# Patient Record
Sex: Female | Born: 1985 | Race: Black or African American | Hispanic: No | Smoking: Never smoker
Health system: Southern US, Community
[De-identification: ages and names within clinical notes are randomized; demographics above are authoritative.]

## PROBLEM LIST (undated history)

## (undated) DIAGNOSIS — I10 Essential (primary) hypertension: Secondary | ICD-10-CM

## (undated) DIAGNOSIS — F419 Anxiety disorder, unspecified: Secondary | ICD-10-CM

## (undated) DIAGNOSIS — I2699 Other pulmonary embolism without acute cor pulmonale: Secondary | ICD-10-CM

## (undated) DIAGNOSIS — C801 Malignant (primary) neoplasm, unspecified: Secondary | ICD-10-CM

## (undated) DIAGNOSIS — D689 Coagulation defect, unspecified: Secondary | ICD-10-CM

## (undated) DIAGNOSIS — Z5189 Encounter for other specified aftercare: Secondary | ICD-10-CM

## (undated) HISTORY — PX: PORT-A-CATH REMOVAL: SHX5289

## (undated) HISTORY — DX: Encounter for other specified aftercare: Z51.89

## (undated) HISTORY — DX: Malignant (primary) neoplasm, unspecified: C80.1

## (undated) HISTORY — PX: KNEE SURGERY: SHX244

## (undated) HISTORY — DX: Coagulation defect, unspecified: D68.9

## (undated) HISTORY — DX: Anxiety disorder, unspecified: F41.9

---

## 2008-12-13 ENCOUNTER — Other Ambulatory Visit: Admission: RE | Admit: 2008-12-13 | Discharge: 2008-12-13 | Payer: Self-pay | Admitting: Obstetrics and Gynecology

## 2009-12-12 ENCOUNTER — Other Ambulatory Visit
Admission: RE | Admit: 2009-12-12 | Discharge: 2009-12-12 | Payer: Self-pay | Source: Home / Self Care | Admitting: Obstetrics and Gynecology

## 2010-06-28 ENCOUNTER — Encounter: Payer: Self-pay | Admitting: Obstetrics & Gynecology

## 2010-06-28 ENCOUNTER — Inpatient Hospital Stay (HOSPITAL_COMMUNITY)
Admission: AD | Admit: 2010-06-28 | Discharge: 2010-06-30 | Payer: Self-pay | Source: Home / Self Care | Attending: Obstetrics & Gynecology | Admitting: Obstetrics & Gynecology

## 2010-09-11 LAB — URINALYSIS, ROUTINE W REFLEX MICROSCOPIC
Leukocytes, UA: NEGATIVE
Nitrite: NEGATIVE
Protein, ur: 100 mg/dL — AB
Specific Gravity, Urine: 1.025 (ref 1.005–1.030)
Urobilinogen, UA: 1 mg/dL (ref 0.0–1.0)

## 2010-09-11 LAB — MAGNESIUM: Magnesium: 3.8 mg/dL — ABNORMAL HIGH (ref 1.5–2.5)

## 2010-09-11 LAB — COMPREHENSIVE METABOLIC PANEL
AST: 31 U/L (ref 0–37)
BUN: 6 mg/dL (ref 6–23)
CO2: 21 mEq/L (ref 19–32)
Calcium: 9 mg/dL (ref 8.4–10.5)
Creatinine, Ser: 0.6 mg/dL (ref 0.4–1.2)
GFR calc Af Amer: >60 mL/min (ref >60)
GFR calc non Af Amer: >60 mL/min (ref >60)
Total Bilirubin: 0.4 mg/dL (ref 0.3–1.2)

## 2010-09-11 LAB — CBC
Hemoglobin: 12 g/dL (ref 12.0–15.0)
MCH: 32.5 pg (ref 26.0–34.0)
MCHC: 35.5 g/dL (ref 30.0–36.0)
MCV: 91.6 fL (ref 78.0–100.0)
Platelets: 221 10*3/uL (ref 150–400)

## 2010-09-11 LAB — URINE MICROSCOPIC-ADD ON

## 2010-09-11 LAB — PROTEIN / CREATININE RATIO, URINE: Creatinine, Urine: 208.9 mg/dL

## 2010-09-11 LAB — MRSA PCR SCREENING: MRSA by PCR: NEGATIVE

## 2010-12-21 ENCOUNTER — Emergency Department (HOSPITAL_COMMUNITY)
Admission: EM | Admit: 2010-12-21 | Discharge: 2010-12-21 | Disposition: A | Discharge status: Home / Self Care | Payer: Medicaid Other | Attending: Emergency Medicine | Admitting: Emergency Medicine

## 2010-12-21 DIAGNOSIS — R112 Nausea with vomiting, unspecified: Secondary | ICD-10-CM | POA: Insufficient documentation

## 2010-12-21 DIAGNOSIS — R197 Diarrhea, unspecified: Secondary | ICD-10-CM | POA: Insufficient documentation

## 2010-12-21 LAB — BASIC METABOLIC PANEL
CO2: 20 mEq/L (ref 19–32)
Chloride: 106 mEq/L (ref 96–112)
GFR calc Af Amer: >60 mL/min (ref >60)
Potassium: 3.8 mEq/L (ref 3.5–5.1)
Sodium: 138 mEq/L (ref 135–145)

## 2010-12-21 LAB — URINALYSIS, ROUTINE W REFLEX MICROSCOPIC
Bilirubin Urine: NEGATIVE
Leukocytes, UA: NEGATIVE
Nitrite: NEGATIVE
Specific Gravity, Urine: 1.025 (ref 1.005–1.030)
Urobilinogen, UA: 0.2 mg/dL (ref 0.0–1.0)
pH: 5.5 (ref 5.0–8.0)

## 2010-12-21 LAB — PREGNANCY, URINE: Preg Test, Ur: NEGATIVE

## 2010-12-21 LAB — URINE MICROSCOPIC-ADD ON

## 2012-05-02 ENCOUNTER — Other Ambulatory Visit: Payer: Self-pay | Admitting: Obstetrics & Gynecology

## 2012-05-02 ENCOUNTER — Other Ambulatory Visit (HOSPITAL_COMMUNITY)
Admission: RE | Admit: 2012-05-02 | Discharge: 2012-05-02 | Disposition: A | Discharge status: Home / Self Care | Payer: Medicaid Other | Source: Ambulatory Visit | Attending: Obstetrics & Gynecology | Admitting: Obstetrics & Gynecology

## 2012-05-02 DIAGNOSIS — Z01419 Encounter for gynecological examination (general) (routine) without abnormal findings: Secondary | ICD-10-CM | POA: Insufficient documentation

## 2013-01-05 ENCOUNTER — Encounter (HOSPITAL_COMMUNITY): Payer: Self-pay | Admitting: Emergency Medicine

## 2013-01-05 ENCOUNTER — Emergency Department (HOSPITAL_COMMUNITY): Payer: 59

## 2013-01-05 ENCOUNTER — Observation Stay (HOSPITAL_COMMUNITY)
Admission: EM | Admit: 2013-01-05 | Discharge: 2013-01-08 | Disposition: A | Discharge status: Home / Self Care | Payer: 59 | Attending: Family Medicine | Admitting: Family Medicine

## 2013-01-05 DIAGNOSIS — I2699 Other pulmonary embolism without acute cor pulmonale: Principal | ICD-10-CM | POA: Insufficient documentation

## 2013-01-05 DIAGNOSIS — R0989 Other specified symptoms and signs involving the circulatory and respiratory systems: Secondary | ICD-10-CM

## 2013-01-05 DIAGNOSIS — R079 Chest pain, unspecified: Secondary | ICD-10-CM | POA: Insufficient documentation

## 2013-01-05 DIAGNOSIS — Z86711 Personal history of pulmonary embolism: Secondary | ICD-10-CM | POA: Diagnosis present

## 2013-01-05 DIAGNOSIS — R0602 Shortness of breath: Secondary | ICD-10-CM | POA: Insufficient documentation

## 2013-01-05 DIAGNOSIS — R0600 Dyspnea, unspecified: Secondary | ICD-10-CM | POA: Diagnosis present

## 2013-01-05 DIAGNOSIS — R0789 Other chest pain: Secondary | ICD-10-CM | POA: Diagnosis present

## 2013-01-05 LAB — COMPREHENSIVE METABOLIC PANEL
Albumin: 3.8 g/dL (ref 3.5–5.2)
BUN: 8 mg/dL (ref 6–23)
Creatinine, Ser: 0.74 mg/dL (ref 0.50–1.10)
Total Bilirubin: 0.7 mg/dL (ref 0.3–1.2)
Total Protein: 7.4 g/dL (ref 6.0–8.3)

## 2013-01-05 LAB — URINALYSIS, ROUTINE W REFLEX MICROSCOPIC
Ketones, ur: NEGATIVE mg/dL
Leukocytes, UA: NEGATIVE
Nitrite: NEGATIVE
Protein, ur: NEGATIVE mg/dL

## 2013-01-05 LAB — CBC WITH DIFFERENTIAL/PLATELET
Basophils Absolute: 0 10*3/uL (ref 0.0–0.1)
Eosinophils Absolute: 0.1 10*3/uL (ref 0.0–0.7)
Eosinophils Relative: 1 % (ref 0–5)
MCH: 31.9 pg (ref 26.0–34.0)
MCV: 90.6 fL (ref 78.0–100.0)
Platelets: 244 10*3/uL (ref 150–400)
RDW: 12.3 % (ref 11.5–15.5)
WBC: 8.8 10*3/uL (ref 4.0–10.5)

## 2013-01-05 LAB — LIPASE, BLOOD: Lipase: 29 U/L (ref 11–59)

## 2013-01-05 MED ORDER — ENOXAPARIN SODIUM 100 MG/ML ~~LOC~~ SOLN
100.0000 mg | Freq: Once | SUBCUTANEOUS | Status: AC
  Administered 2013-01-05: 100 mg via SUBCUTANEOUS
  Filled 2013-01-05: qty 1

## 2013-01-05 MED ORDER — MORPHINE SULFATE 4 MG/ML IJ SOLN
4.0000 mg | Freq: Once | INTRAMUSCULAR | Status: AC
  Administered 2013-01-05: 4 mg via INTRAVENOUS
  Filled 2013-01-05: qty 1

## 2013-01-05 MED ORDER — ACETAMINOPHEN 650 MG RE SUPP: 650.0000 mg | Freq: Four times a day (QID) | RECTAL | Status: DC | PRN

## 2013-01-05 MED ORDER — RIVAROXABAN 10 MG PO TABS
15.0000 mg | ORAL_TABLET | Freq: Two times a day (BID) | ORAL | Status: DC
  Administered 2013-01-05 – 2013-01-08 (×6): 15 mg via ORAL
  Filled 2013-01-05 (×6): qty 2

## 2013-01-05 MED ORDER — IOHEXOL 350 MG/ML SOLN
100.0000 mL | Freq: Once | INTRAVENOUS | Status: AC | PRN
  Administered 2013-01-05: 100 mL via INTRAVENOUS

## 2013-01-05 MED ORDER — MORPHINE SULFATE 2 MG/ML IJ SOLN: 2.0000 mg | Freq: Once | INTRAMUSCULAR | Status: AC

## 2013-01-05 MED ORDER — ONDANSETRON HCL 4 MG PO TABS: 4.0000 mg | ORAL_TABLET | Freq: Four times a day (QID) | ORAL | Status: DC | PRN

## 2013-01-05 MED ORDER — ONDANSETRON HCL 4 MG/2ML IJ SOLN
4.0000 mg | Freq: Once | INTRAMUSCULAR | Status: AC
  Administered 2013-01-05: 4 mg via INTRAVENOUS
  Filled 2013-01-05: qty 2

## 2013-01-05 MED ORDER — ALUM & MAG HYDROXIDE-SIMETH 200-200-20 MG/5ML PO SUSP: 30.0000 mL | Freq: Four times a day (QID) | ORAL | Status: DC | PRN

## 2013-01-05 MED ORDER — HYDROCODONE-ACETAMINOPHEN 7.5-325 MG PO TABS
1.0000 | ORAL_TABLET | Freq: Four times a day (QID) | ORAL | Status: DC | PRN
  Administered 2013-01-05 – 2013-01-06 (×3): 1 via ORAL
  Filled 2013-01-05 (×3): qty 1

## 2013-01-05 MED ORDER — MORPHINE SULFATE 2 MG/ML IJ SOLN
2.0000 mg | INTRAMUSCULAR | Status: DC | PRN
  Administered 2013-01-05 – 2013-01-06 (×4): 2 mg via INTRAVENOUS
  Filled 2013-01-05 (×4): qty 1

## 2013-01-05 MED ORDER — ONDANSETRON HCL 4 MG/2ML IJ SOLN
4.0000 mg | Freq: Four times a day (QID) | INTRAMUSCULAR | Status: DC | PRN
  Administered 2013-01-05 – 2013-01-07 (×3): 4 mg via INTRAVENOUS
  Filled 2013-01-05 (×3): qty 2

## 2013-01-05 MED ORDER — MORPHINE SULFATE 2 MG/ML IJ SOLN: 2.0000 mg | INTRAMUSCULAR | Status: DC | PRN

## 2013-01-05 MED ORDER — SODIUM CHLORIDE 0.9 % IJ SOLN: 3.0000 mL | INTRAMUSCULAR | Status: DC | PRN

## 2013-01-05 MED ORDER — ACETAMINOPHEN 325 MG PO TABS
650.0000 mg | ORAL_TABLET | Freq: Four times a day (QID) | ORAL | Status: DC | PRN
  Administered 2013-01-06: 650 mg via ORAL
  Filled 2013-01-05: qty 2

## 2013-01-05 MED ORDER — MORPHINE SULFATE 2 MG/ML IJ SOLN
INTRAMUSCULAR | Status: AC
  Administered 2013-01-05: 2 mg via INTRAVENOUS
  Filled 2013-01-05: qty 1

## 2013-01-05 MED ORDER — SODIUM CHLORIDE 0.9 % IV SOLN: 250.0000 mL | INTRAVENOUS | Status: DC | PRN

## 2013-01-05 MED ORDER — SODIUM CHLORIDE 0.9 % IJ SOLN
3.0000 mL | Freq: Two times a day (BID) | INTRAMUSCULAR | Status: DC
  Administered 2013-01-05 – 2013-01-08 (×7): 3 mL via INTRAVENOUS

## 2013-01-05 NOTE — H&P (Signed)
[facility] History and Physical  Denise Khan XBJ:[id number] DOB: 1986/06/05 DOA: 01/05/2013  Referring physician:  PCP: No PCP Per Patient  Specialists:   Chief Complaint: chest pain/dyspnea  HPI: Denise Khan is a very pleasant 27 y.o. female with no significant past medical history who presents to the emergency room with a chief complaint of chest pain and shortness of breath. Information is obtained from the patient. She indicates that she was in her usual state of health until yesterday while watching TV she developed a sudden sharp pain just under her left breast. She indicates at that time the pain was constant sharp at a rate of 3/10. She states that as the evening and night progressed the pain intensified and spread to her low back on the left side up to her left scapula. By early this morning the pain had spread to her left shoulder and down her left arm. She describes it as a throbbing sensation in her arm. She denies any numbness or tingling or weakness in this arm. She rated the pain at this point a 8/10. She developed shortness of breath as well. She denies any cough. She indicates that she tried to lie down but that made the pain and the dyspnea worse. She also indicates that sitting up wasn't comfortable so she paced in her home for a good portion of the night last night. She apparently sat in a recliner and dozed for a while. She states that when she awakened this morning she noted a slight improvement so she took a shower and prepared to go to work. On the drive to [facility] where she works she indicates that the pain and shortness of breath began to worsen again. She turned around and came home. At that point she tried to lie down again and got no relief. She decided to come to the emergency room at this point. She denies any chest pain headache visual disturbances. She does note some intermittent nausea without vomiting and some dizziness. She denies any recent travel.  She has not had any surgery lately. Evaluation in the emergency room notes the CT angiogram chest positive for pulmonary embolism. She was given a dose of Lovenox. Symptoms came on suddenly have persisted and worsened. Characterized as moderate. [facility] were asked to admit.   Review of Systems: The patient denies anorexia, fever, weight loss,, vision loss, decreased hearing, hoarseness, chest pain, syncope,  peripheral edema, balance deficits, hemoptysis, abdominal pain, melena, hematochezia, severe indigestion/heartburn, hematuria, incontinence, genital sores, muscle weakness, suspicious skin lesions, transient blindness, difficulty walking, depression, unusual weight change, abnormal bleeding, enlarged lymph nodes, angioedema, and breast masses.    History reviewed. No pertinent past medical history. History reviewed. No pertinent past surgical history. Social History:  has no tobacco, alcohol, and drug history on file. Patient lives with her significant other and her small son. 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 She denies smoking drinking or illicit drug use.   No Known Allergies  No family history on file. her mother is alive and 58 years old and has no medical history. Her father is alive he is 51 years old and has hypertension. She has 3 brothers who have no medical history.  rior to Admission medications   Not on File   Physical Exam: Filed Vitals:   01/05/13 1200 01/05/13 1230 01/05/13 1300 01/05/13 1348  BP: 144/81 135/75 138/72 145/92  Pulse: 76 73 74 73  Temp:    98.5  F (36.9 C)  TempSrc:      Resp: 17 20 19    Height:    5\' 10"  (1.778 m)  Weight:    102.059 kg (225 lb)  SpO2: 100% 100% 100% 100%     General:  Well-nourished no acute distress  Eyes: EOMI, PERRLA, no scleral icterus  ENT: Ears clear nose without drainage oropharynx without erythema or exudate. Mucous membranes of her mouth are moist and pink  Neck: Supple no  lymphadenopathy full range of motion  Cardiovascular: Regular rate and rhythm no murmur gallop or rub no lower extremity edema pedal pulses present and palpable  Respiratory: Moderate increased work of breathing with conversation. Breath sounds clear bilaterally no rhonchi wheezes. Tachypnea  Abdomen: Obese soft positive bowel sounds nontender to palpation no mass organomegaly noted  Skin: Warm and dry no rash no lesions  Musculoskeletal: No clubbing no cyanosis  Psychiatric: Calm cooperative  Neurologic: Cranial nerves II through XII grossly intact speech clear facial some  Labs on Admission:  Basic Metabolic Panel:  Recent Labs Lab 01/05/13 0909  NA 138  K 3.9  CL 105  CO2 24  GLUCOSE 116*  BUN 8  CREATININE 0.74  CALCIUM 9.2   Liver Function Tests:  Recent Labs Lab 01/05/13 0909  AST 17  ALT 15  ALKPHOS 44  BILITOT 0.7  PROT 7.4  ALBUMIN 3.8    Recent Labs Lab 01/05/13 0909  LIPASE 29   No results found for this basename: AMMONIA,  in the last 168 hours CBC:  Recent Labs Lab 01/05/13 0909  WBC 8.8  NEUTROABS 6.0  HGB 13.6  HCT 38.7  MCV 90.6  PLT 244   Cardiac Enzymes:  Recent Labs Lab 01/05/13 0909  TROPONINI <0.30    BNP (last 3 results) No results found for this basename: PROBNP,  in the last 8760 hours CBG: No results found for this basename: GLUCAP,  in the last 168 hours  Radiological Exams on Admission: Ct Angio Chest Pe W/cm &/or Wo Cm  01/05/2013   *RADIOLOGY REPORT*  Clinical Data: Chest pain, left upper quadrant pain  CT ANGIOGRAPHY CHEST  Technique:  Multidetector CT imaging of the chest using the standard protocol during bolus administration of intravenous contrast. Multiplanar reconstructed images including MIPs were obtained and reviewed to evaluate the vascular anatomy.  Contrast: OMNIPAQUE IOHEXOL 350 MG/ML SOLN  Comparison: None.  Findings: Images of the thoracic inlet are unremarkable.  Central airways are patent.   No mediastinal hematoma or adenopathy.  Sagittal images of the spine are unremarkable.  Images of the lung parenchyma shows no pulmonary edema.  The study is of excellent technical quality.  There is a thrombus/pulmonary embolus in the left lower lobe arterial branch best seen in the coronal image 74.  The thrombosis extending in the at least two segmental arterial branches left lower lobe.  This is confirmed on coronal image 49.  Small amount of infiltrate or lung infarction noted in the left lower lobe laterally axial image 71.  No pulmonary edema.  No pleural effusion.  Visualized upper abdomen is unremarkable.  IMPRESSION:  1.  The study is positive for pulmonary embolus in the left lower lobe extending in at least two segmental arterial branches best seen in coronal image 74. Thrombus measures at least 2 cm in length.  Small amount of infiltrate or infarcted lung noted in left lower lobe laterally. 2.  No adenopathy. 3.  No pulmonary edema.  Critical findings discussed with  Dr. Lincoln Maxin   Original Report Authenticated By: Natasha Mead, M.D.   Dg Chest Portable 1 View  01/05/2013   *RADIOLOGY REPORT*  Clinical Data: Chest pain  PORTABLE CHEST - 1 VIEW  Comparison: none  Findings: Normal heart size.  No pleural effusion or edema.  The lung volumes appear low.  No airspace consolidation.  IMPRESSION:  1.  Decreased lung volumes.   Original Report Authenticated By: Signa Kell, M.D.    EKG: Independently reviewed. Normal sinus rhythm  Assessment/Plan Principal Problem:   Acute pulmonary embolism: Etiology uncertain. Will admit to telemetry. Will obtain a hypercoag panel. Will start Sarot 50 mg by mouth twice a day. Active Problems:    Dyspnea: We'll continue oxygen support via nasal cannula. Currently patient is having some tachypnea. Oxygen saturation levels greater than 95% on 2 L. Will monitor closely. Will trial sats in am.     Chest pain: Related to #1. EKG without indications of ischemia. Somewhat  improved with pain medicine. Will continue morphine every 3 hours as needed for pain.   Code Status: Full  Family Communication: Mother at bedside  Disposition.: Home when ready. Likely 24-48 hours  Time spent: 50 minutes  Gwenyth Bender [facility] Pager [phone]  If 7PM-7AM, please contact night-coverage [email or web address] Password [facility] 01/05/2013, 3:41 PM   Attending note:  Patient seen and examined.  Above note reviewed.  Patient admitted with PE now on Xarelto. She does use hormonal contraceptive and has Norplant implant.  Will need to discuss with GYN regarding this. She is dyspneic and has pain that is being treated,  MEMON,JEHANZEB

## 2013-01-05 NOTE — ED Notes (Signed)
Update given to [facility] [place] on nursing unit regarding medication administration.

## 2013-01-05 NOTE — ED Notes (Addendum)
The patient appears short of breath and when questioned she confirms that she is feeling short of breath.  States that she did feel short of breath last night and the sensation was worse with lying down.

## 2013-01-05 NOTE — ED Provider Notes (Signed)
History    CSN: [id number] Arrival date & time 01/05/13  9562  First MD Initiated Contact with Patient 01/05/13 [phone]     Chief Complaint  Patient presents with  . Chest Pain  . Shortness of Breath   (Consider location/radiation/quality/duration/timing/severity/associated sxs/prior Treatment) HPI Comments: Denise Khan is a 27 y.o. Female presenting with sudden onset of left sided chest pain and shortness of breath while at home last night watching tv.  The pain is under her left breast and is worsened with deep inspiration,  Although also describes epigastric pain and nausea.  The pain radiates into her left shoulder and upper arm, and feels a hint of pain in her left forearm with deep inspiration. She also feels increased pain and sob when supine.  She denies fevers, chills, any recent illnesses,  Cough, and denies leg swelling or pain.  She has not been sedentary for any prolonged periods.  She is on the implanon injection for birth control.  She has no pertinent medical history.  Her family history is pertinent for father with CAD older than age 37.  She has taken no medicine prior to arrival.       The history is provided by the patient.   History reviewed. No pertinent past medical history. History reviewed. No pertinent past surgical history. No family history on file. History  Substance Use Topics  . Smoking status: Not on file  . Smokeless tobacco: Not on file  . Alcohol Use: Not on file   OB History   Grav Para Term Preterm Abortions TAB SAB Ect Mult Living                 Review of Systems  Constitutional: Negative for fever.  HENT: Negative for congestion, sore throat and neck pain.   Eyes: Negative.   Respiratory: Positive for shortness of breath. Negative for chest tightness.   Cardiovascular: Positive for chest pain.  Gastrointestinal: Positive for nausea. Negative for vomiting, abdominal pain and diarrhea.  Genitourinary: Negative.   Musculoskeletal:  Negative for joint swelling and arthralgias.  Skin: Negative.  Negative for rash and wound.  Neurological: Negative for dizziness, weakness, light-headedness, numbness and headaches.  Psychiatric/Behavioral: Negative.     Allergies  Review of patient's allergies indicates no known allergies.  Home Medications  No current outpatient prescriptions on file. BP 144/77  Pulse 77  Temp(Src) 98.5 F (36.9 C) (Oral)  Resp 21  Ht 5\' 10"  (1.778 m)  Wt 220 lb (99.791 kg)  BMI 31.57 kg/m2  SpO2 100%  LMP 12/08/2012 Physical Exam  Nursing note and vitals reviewed. Constitutional: She appears well-developed and well-nourished.  HENT:  Head: Normocephalic and atraumatic.  Eyes: Conjunctivae are normal.  Neck: Normal range of motion.  Cardiovascular: Normal rate, regular rhythm, normal heart sounds and intact distal pulses.   Pulses:      Dorsalis pedis pulses are 2+ on the right side, and 2+ on the left side.  Pulmonary/Chest: Effort normal and breath sounds normal. She has no decreased breath sounds. She has no wheezes. She has no rhonchi.  Tachypnea,  Difficulty speaking in full sentences.  Abdominal: Soft. Bowel sounds are normal. There is no tenderness.  Musculoskeletal: Normal range of motion. She exhibits no edema and no tenderness.  Neurological: She is alert.  Skin: Skin is warm and dry.  Psychiatric: She has a normal mood and affect.    ED Course  Procedures (including critical care time)    Date: 01/05/2013  Rate: 74 Rhythm: normal sinus rhythm  QRS Axis: normal  Intervals: normal  ST/T Wave abnormalities: normal  Conduction Disutrbances:none  Narrative Interpretation:   Old EKG Reviewed: none available   Labs Reviewed  URINALYSIS, ROUTINE W REFLEX MICROSCOPIC - Abnormal; Notable for the following:    Specific Gravity, Urine <1.005 (*)    All other components within normal limits  COMPREHENSIVE METABOLIC PANEL - Abnormal; Notable for the following:    Glucose,  Bld 116 (*)    All other components within normal limits  TROPONIN I  CBC WITH DIFFERENTIAL  PREGNANCY, URINE  LIPASE, BLOOD   Ct Angio Chest Pe W/cm &/or Wo Cm  01/05/2013   *RADIOLOGY REPORT*  Clinical Data: Chest pain, left upper quadrant pain  CT ANGIOGRAPHY CHEST  Technique:  Multidetector CT imaging of the chest using the standard protocol during bolus administration of intravenous contrast. Multiplanar reconstructed images including MIPs were obtained and reviewed to evaluate the vascular anatomy.  Contrast: OMNIPAQUE IOHEXOL 350 MG/ML SOLN  Comparison: None.  Findings: Images of the thoracic inlet are unremarkable.  Central airways are patent.  No mediastinal hematoma or adenopathy.  Sagittal images of the spine are unremarkable.  Images of the lung parenchyma shows no pulmonary edema.  The study is of excellent technical quality.  There is a thrombus/pulmonary embolus in the left lower lobe arterial branch best seen in the coronal image 74.  The thrombosis extending in the at least two segmental arterial branches left lower lobe.  This is confirmed on coronal image 49.  Small amount of infiltrate or lung infarction noted in the left lower lobe laterally axial image 71.  No pulmonary edema.  No pleural effusion.  Visualized upper abdomen is unremarkable.  IMPRESSION:  1.  The study is positive for pulmonary embolus in the left lower lobe extending in at least two segmental arterial branches best seen in coronal image 74. Thrombus measures at least 2 cm in length.  Small amount of infiltrate or infarcted lung noted in left lower lobe laterally. 2.  No adenopathy. 3.  No pulmonary edema.  Critical findings discussed with Dr. Lincoln Maxin   Original Report Authenticated By: Natasha Mead, M.D.   Dg Chest Portable 1 View  01/05/2013   *RADIOLOGY REPORT*  Clinical Data: Chest pain  PORTABLE CHEST - 1 VIEW  Comparison: none  Findings: Normal heart size.  No pleural effusion or edema.  The lung volumes appear  low.  No airspace consolidation.  IMPRESSION:  1.  Decreased lung volumes.   Original Report Authenticated By: Signa Kell, M.D.   1. Pulmonary emboli     MDM  Patients labs and/or radiological studies were viewed and considered during the medical decision making and disposition process. Discussed findings with patient and with Dr. Kerry Hough who agrees with admission.  Temp admit orders completed.  lovenox [place] x 1 given now.  PE of unclear etiology.     Burgess Amor, PA-C 01/05/13 1207

## 2013-01-05 NOTE — ED Notes (Signed)
States that she started having epigastric pain last night with pain that radiated into her left shoulder and left ribs.  States that she does feel some nausea currently.  States that she felt lightheaded last night but not today.  Denies vomiting, diaphoresis or shortness of breath.

## 2013-01-05 NOTE — ED Notes (Signed)
States that her pain has returned, rates pain 9/10, see MAR.

## 2013-01-05 NOTE — ED Notes (Signed)
No old ekg in the muse. Denise Khan 

## 2013-01-05 NOTE — ED Notes (Signed)
Patient is resting comfortably. 

## 2013-01-06 ENCOUNTER — Encounter (HOSPITAL_COMMUNITY): Payer: Self-pay | Admitting: *Deleted

## 2013-01-06 LAB — ANTITHROMBIN III: AntiThromb III Func: 100 % (ref 75–120)

## 2013-01-06 LAB — CARDIOLIPIN ANTIBODIES, IGG, IGM, IGA: Anticardiolipin IgG: 20 GPL U/mL (ref <23)

## 2013-01-06 LAB — BETA-2-GLYCOPROTEIN I ABS, IGG/M/A
Beta-2 Glyco I IgG: 2 G Units (ref <20)
Beta-2-Glycoprotein I IgA: 0 A Units (ref <20)

## 2013-01-06 LAB — LUPUS ANTICOAGULANT PANEL
Lupus Anticoagulant: NOT DETECTED
PTTLA 4:1 Mix: 41.7 secs (ref 28.0–43.0)
dRVVT Incubated 1:1 Mix: 38.4 secs (ref <42.9)

## 2013-01-06 LAB — PROTEIN S ACTIVITY: Protein S Activity: 87 % (ref 69–129)

## 2013-01-06 LAB — PROTEIN C ACTIVITY: Protein C Activity: 126 % (ref 75–133)

## 2013-01-06 MED ORDER — MORPHINE SULFATE 2 MG/ML IJ SOLN
1.0000 mg | INTRAMUSCULAR | Status: DC | PRN
  Administered 2013-01-06: 1 mg via INTRAVENOUS
  Filled 2013-01-06: qty 1

## 2013-01-06 MED ORDER — MORPHINE SULFATE 2 MG/ML IJ SOLN: 1.0000 mg | Freq: Four times a day (QID) | INTRAMUSCULAR | Status: DC | PRN

## 2013-01-06 MED ORDER — HYDROCODONE-ACETAMINOPHEN 7.5-325 MG PO TABS
1.0000 | ORAL_TABLET | ORAL | Status: DC | PRN
  Administered 2013-01-06 – 2013-01-07 (×4): 1 via ORAL
  Filled 2013-01-06 (×3): qty 1

## 2013-01-06 NOTE — Progress Notes (Signed)
Utilization Review Complete  

## 2013-01-06 NOTE — Care Management Note (Signed)
    Page 1 of 1   01/07/2013     3:43:16 PM   CARE MANAGEMENT NOTE 01/07/2013  Patient:  Denise Khan, Denise Khan   Account Number:  [id number]  Date Initiated:  01/06/2013  Documentation initiated by:  Rosemary Holms  Subjective/Objective Assessment:   Pt lives at home. Acute PE requiring O2 and narcotics (IV and PO alternating). Will DC home when medically stable. No PCP.     Action/Plan:   Anticipated DC Date:  01/07/2013   Anticipated DC Plan:  HOME/SELF CARE      DC Planning Services  CM consult      Choice offered to / List presented to:             Status of service:  Completed, signed off Medicare Important Message given?   (If response is "NO", the following Medicare IM given date fields will be blank) Date Medicare IM given:   Date Additional Medicare IM given:    Discharge Disposition:  HOME/SELF CARE  Per UR Regulation:    If discussed at Long Length of Stay Meetings, dates discussed:    Comments:  01/06/13 11:30 Bonnee Zertuche Leanord Hawking RN BSN CM Pt given options for a PCP. Agreed to new [employer]. Appt made and entered on DC instructions. Pt aware and agreeable.

## 2013-01-06 NOTE — Progress Notes (Addendum)
Weaned pt off of O2.  At rest pt is satting 100% on RA  Standing up, pt is satting between 99%-100% on RA.  Ambulation to bathroom, pt is satting 98% on RA  Ambulation in hallway, pt is satting 99%-100% on RA  Within 5 mins of resting, pt returned to 100% on RA will continue to monitor and leave O2 off at this time.

## 2013-01-06 NOTE — ED Provider Notes (Signed)
Medical screening examination/treatment/procedure(s) were conducted as a shared visit with non-physician practitioner(s) and myself.  I personally evaluated the patient during the encounter.  Patient presents with unexplained dyspnea.   CT of chest reveals a small pulmonary embolism.  Will admit to general medicine   Donnetta Hutching, MD 01/06/13 [phone]

## 2013-01-06 NOTE — Progress Notes (Addendum)
[facility] PROGRESS NOTE  Denise Khan:[id number] DOB: 01-May-1986 DOA: 01/05/2013 PCP: No PCP Per Patient  Assessment/Plan: Acute pulmonary embolism: Etiology uncertain. hypercoag panel in process. Continue xarelto 15mg  BID. Day #2/21. Of note, spoke to Dr. Emelda Fear regarding pt Norplant implant and he opined that this did not have to be removed.  Active Problems:  Dyspnea: Only mild improvement.  We'll continue oxygen support via nasal cannula but wean to saturation level. Oxygen saturation level 100% on 2L . Will ambulate and document sats.   Chest pain: only some improvement.  Related to #1. EKG without indications of ischemia. Will continue pain medicine but transition to po as pt very lethargic during my exam this am.     Code Status: full Family Communication:  Disposition Plan: home when ready hopefully tomorrow.    Consultants:  none  Procedures:  none  Antibiotics:  none  HPI/Subjective: Eyes closed, easily aroused. Somewhat lethargic, reports "just got pain shot".   Objective: Filed Vitals:   01/05/13 1439 01/05/13 1557 01/05/13 2047 01/06/13 0416  BP: 141/71  136/80 144/84  Pulse:   81 77  Temp: 98.4 F (36.9 C)  98.7 F (37.1 C) 98.6 F (37 C)  TempSrc:   Oral Oral  Resp:   20 20  Height:      Weight:      SpO2:  100% 100% 100%    Intake/Output Summary (Last 24 hours) at 01/06/13 1047 Last data filed at 01/06/13 0835  Gross per 24 hour  Intake    360 ml  Output    200 ml  Net    160 ml   Filed Weights   01/05/13 0842 01/05/13 1348  Weight: 99.791 kg (220 lb) 102.059 kg (225 lb)    Exam:   General:  Well nourished somewhat lethargic NAD  Cardiovascular: RRR No MGR No LE edema PPP  Respiratory: normal effort while sleeping. Mild increased work of breathing with conversation. BS clear to ausculation bilaterally no wheeze no rhonchi  Abdomen: soft +BS non-tender to palpation  Musculoskeletal: no clubbing no cyanosis.     Data Reviewed: Basic Metabolic Panel:  Recent Labs Lab 01/05/13 0909  NA 138  K 3.9  CL 105  CO2 24  GLUCOSE 116*  BUN 8  CREATININE 0.74  CALCIUM 9.2   Liver Function Tests:  Recent Labs Lab 01/05/13 0909  AST 17  ALT 15  ALKPHOS 44  BILITOT 0.7  PROT 7.4  ALBUMIN 3.8    Recent Labs Lab 01/05/13 0909  LIPASE 29   No results found for this basename: AMMONIA,  in the last 168 hours CBC:  Recent Labs Lab 01/05/13 0909  WBC 8.8  NEUTROABS 6.0  HGB 13.6  HCT 38.7  MCV 90.6  PLT 244   Cardiac Enzymes:  Recent Labs Lab 01/05/13 0909  TROPONINI <0.30   BNP (last 3 results) No results found for this basename: PROBNP,  in the last 8760 hours CBG: No results found for this basename: GLUCAP,  in the last 168 hours  No results found for this or any previous visit (from the past 240 hour(s)).   Studies: Ct Angio Chest Pe W/cm &/or Wo Cm  01/05/2013   *RADIOLOGY REPORT*  Clinical Data: Chest pain, left upper quadrant pain  CT ANGIOGRAPHY CHEST  Technique:  Multidetector CT imaging of the chest using the standard protocol during bolus administration of intravenous contrast. Multiplanar reconstructed images including MIPs were obtained and reviewed to evaluate the vascular  anatomy.  Contrast: OMNIPAQUE IOHEXOL 350 MG/ML SOLN  Comparison: None.  Findings: Images of the thoracic inlet are unremarkable.  Central airways are patent.  No mediastinal hematoma or adenopathy.  Sagittal images of the spine are unremarkable.  Images of the lung parenchyma shows no pulmonary edema.  The study is of excellent technical quality.  There is a thrombus/pulmonary embolus in the left lower lobe arterial branch best seen in the coronal image 74.  The thrombosis extending in the at least two segmental arterial branches left lower lobe.  This is confirmed on coronal image 49.  Small amount of infiltrate or lung infarction noted in the left lower lobe laterally axial image 71.  No  pulmonary edema.  No pleural effusion.  Visualized upper abdomen is unremarkable.  IMPRESSION:  1.  The study is positive for pulmonary embolus in the left lower lobe extending in at least two segmental arterial branches best seen in coronal image 74. Thrombus measures at least 2 cm in length.  Small amount of infiltrate or infarcted lung noted in left lower lobe laterally. 2.  No adenopathy. 3.  No pulmonary edema.  Critical findings discussed with Dr. Lincoln Maxin   Original Report Authenticated By: Natasha Mead, M.D.   Dg Chest Portable 1 View  01/05/2013   *RADIOLOGY REPORT*  Clinical Data: Chest pain  PORTABLE CHEST - 1 VIEW  Comparison: none  Findings: Normal heart size.  No pleural effusion or edema.  The lung volumes appear low.  No airspace consolidation.  IMPRESSION:  1.  Decreased lung volumes.   Original Report Authenticated By: Signa Kell, M.D.    Scheduled Meds: . Rivaroxaban  15 mg Oral BID WC  . sodium chloride  3 mL Intravenous Q12H   Continuous Infusions:   Principal Problem:   Acute pulmonary embolism Active Problems:   Dyspnea   Chest pain    Time spent: 30 minutes    [facility]  [facility] Pager [phone]. If 7PM-7AM, please contact night-coverage at [email or web address], password [facility] 01/06/2013, 10:47 AM  LOS: 1 day   Attending note:  Patient seen and independently examined. Above note reviewed.  Patient is feeling a little better today in terms of shortness of breath. Her pain appears to be mildly improved. She still gets very short of breath due to pain. Adjustments are being made to her pain medication regimen. She's been encouraged to use the incentive spirometer. She is continued on Xarelto for anticoagulation purposes. Hypercoagulable panel thus far has been unremarkable. The family was informed that GYN does not recommend removal of Norplant implant. Her family asked if this could be removed anyway at the patient request. I informed them that they would need to  followup with GYN to further discuss this. Anticipate that she'll be referred to discharge next 24 hours provided her pain is reasonably controlled.  MEMON,JEHANZEB

## 2013-01-07 LAB — PROTEIN C, TOTAL: Protein C, Total: 62 % — ABNORMAL LOW (ref 72–160)

## 2013-01-07 MED ORDER — HYDROCODONE-ACETAMINOPHEN 7.5-325 MG PO TABS
1.0000 | ORAL_TABLET | ORAL | Status: DC | PRN
  Administered 2013-01-07 – 2013-01-08 (×7): 1 via ORAL
  Filled 2013-01-07 (×7): qty 1

## 2013-01-07 MED ORDER — SENNOSIDES-DOCUSATE SODIUM 8.6-50 MG PO TABS
1.0000 | ORAL_TABLET | Freq: Two times a day (BID) | ORAL | Status: AC
  Administered 2013-01-07 (×2): 1 via ORAL
  Filled 2013-01-07 (×2): qty 1

## 2013-01-07 NOTE — Progress Notes (Signed)
[facility] PROGRESS NOTE  Kamaile Zachow Hesser ZOX:[id number] DOB: [birth year] DOA: 01/05/2013 PCP: No PCP Per Patient  Assessment/Plan: Acute pulmonary embolism: Etiology uncertain. hypercoag panel unremarkable. Continue xarelto 15mg  BID. Day #3/21. Of note, spoke to Dr. Emelda Fear regarding pt Norplant implant and he opined that this did not have to be removed.  Active Problems:  Dyspnea: Some improvement.Good air movement and oxygen saturation levels >90 at rest and with exertion on room air. Pt breathing shallowly due to pain.  Dizziness: complained of dizziness upon rising out of bed. Not orthostatic. Encouraged ambulation. Po intake good.    Chest pain: only some improvement. States po pain med not "lasting long enough".  Related to #1. EKG without indications of ischemia. Continue pain medicine with adjustments.    Code Status: full Family Communication: mother at bedside Disposition Plan: home hopefully this afternoon or in am   Consultants:  none  Procedures:  none  Antibiotics:  none  HPI/Subjective: Sitting up in bed eating. Reports continued pain with inspiration. Reports pain medicine not lasting 4 hours  Objective: Filed Vitals:   01/07/13 0944 01/07/13 0945 01/07/13 0946 01/07/13 1400  BP: 129/83 133/86  144/66  Pulse: 68 77  81  Temp:    98.6 F (37 C)  TempSrc:    Oral  Resp: 20 20  20   Height:      Weight:      SpO2: 100% 79% 96% 100%    Intake/Output Summary (Last 24 hours) at 01/07/13 1444 Last data filed at 01/07/13 1200  Gross per 24 hour  Intake    960 ml  Output      0 ml  Net    960 ml   Filed Weights   01/05/13 0842 01/05/13 1348  Weight: 99.791 kg (220 lb) 102.059 kg (225 lb)    Exam:   General:  Well nourished NAD  Cardiovascular: RRR No MGR No LE edema  Respiratory: mild increased work of breathing with conversation BS diminished no wheeze no rhonchi  Abdomen: obese soft +BS non-tender to palpation  Musculoskeletal: no  clubbing no cyanosis  Data Reviewed: Basic Metabolic Panel:  Recent Labs Lab 01/05/13 0909  NA 138  K 3.9  CL 105  CO2 24  GLUCOSE 116*  BUN 8  CREATININE 0.74  CALCIUM 9.2   Liver Function Tests:  Recent Labs Lab 01/05/13 0909  AST 17  ALT 15  ALKPHOS 44  BILITOT 0.7  PROT 7.4  ALBUMIN 3.8    Recent Labs Lab 01/05/13 0909  LIPASE 29   No results found for this basename: AMMONIA,  in the last 168 hours CBC:  Recent Labs Lab 01/05/13 0909  WBC 8.8  NEUTROABS 6.0  HGB 13.6  HCT 38.7  MCV 90.6  PLT 244   Cardiac Enzymes:  Recent Labs Lab 01/05/13 0909  TROPONINI <0.30   BNP (last 3 results) No results found for this basename: PROBNP,  in the last 8760 hours CBG: No results found for this basename: GLUCAP,  in the last 168 hours  No results found for this or any previous visit (from the past 240 hour(s)).   Studies: No results found.  Scheduled Meds: . Rivaroxaban  15 mg Oral BID WC  . senna-docusate  1 tablet Oral BID  . sodium chloride  3 mL Intravenous Q12H   Continuous Infusions:   Principal Problem:   Acute pulmonary embolism Active Problems:   Dyspnea   Chest pain    Time spent: 30  minutes    Gwenyth Bender  [facility] Pager [phone] 7PM-7AM, please contact night-coverage at [email or web address], password [facility] 01/07/2013, 2:44 PM  LOS: 2 days

## 2013-01-07 NOTE — Progress Notes (Signed)
Patient seen, independently examined and chart reviewed. I agree with exam, assessment and plan discussed with Toya Smothers, NP.  27 year old woman who developed chest pain shortness of breath with sudden onset. Further evaluation revealed acute pulmonary embolism. She was started on Xarelto. She does have a contraceptive implant in place. Hypercoagulability panel was generally unremarkable. Overall she is somewhat improved although still dyspneic on exertion still has some left-sided chest pain.  She has remained afebrile stable vital signs, no hypoxia and telemetry is unremarkable. She appears calm, mildly short of breath but nontoxic. Lungs are clear to examination the heart is regular rate and rhythm. No murmur, rub, gallop.  Impression: Acute PE  left lower lobe with possible small amount of infiltrate or infarct possibly secondary etonogestrel subdermal implant. Family and patient wanted this removed. Will discuss with Dr. Emelda Fear, this could be done as an outpatient.   Brendia Sacks, MD [facility] [phone]

## 2013-01-07 NOTE — Discharge Summary (Signed)
Physician Discharge Summary  Denise Khan HQI:[id number] DOB: 1986-03-02 DOA: 01/05/2013  PCP: No PCP Per Patient  Admit date: 01/05/2013 Discharge date: 01/08/2013  Time spent:40 minutes  Recommendations for Outpatient Follow-up:  1. Has appointment [facility] 01/13/13 for evaluation of symptoms 2. Has appointment with Dr. Emelda Fear on 01/23/13 for removal of implant  Discharge Diagnoses:  Principal Problem:   Acute pulmonary embolism Active Problems:   Dyspnea   Chest pain   Discharge Condition: stable  Diet recommendation: regular  Filed Weights   01/05/13 0842 01/05/13 1348  Weight: 99.791 kg (220 lb) 102.059 kg (225 lb)    History of present illness:  Denise Khan is a very pleasant 27 y.o. female with no significant past medical history who presented to the emergency room on 01/05/13 with a chief complaint of chest pain and shortness of breath. Information obtained from the patient. She indicated that she was in her usual state of health until 01/04/13 while watching TV she developed a sudden sharp pain just under her left breast. She indicated at that time the pain was constant sharp at a rate of 3/10. She stated that as the evening and night progressed the pain intensified and spread to her low back on the left side up to her left scapula. By early the next morning the pain had spread to her left shoulder and down her left arm. She described it as a throbbing sensation in her arm. She denied any numbness or tingling or weakness in this arm. She rated the pain at that point a 8/10. She developed shortness of breath as well. She denied any cough. She indicated that she tried to lie down but that made the pain and the dyspnea worse. She also indicated that sitting up wasn't comfortable so she paced in her home for a good portion of the night. She apparently sat in a recliner and dozed for a while. She stated that when she awakened she noted a slight improvement so she took a  shower and prepared to go to work. On the drive to [facility] where she works she indicated that the pain and shortness of breath began to worsen again. She turned around and came home. At that point she tried to lie down again and got no relief. She decided to come to the emergency room at this point. She denied any chest pain headache visual disturbances. She did note some intermittent nausea without vomiting and some dizziness. She denied any recent travel. She had not had any surgery lately. Evaluation in the emergency room noted the CT angiogram chest positive for pulmonary embolism. She was given a dose of Lovenox.   Hospital Course:  Acute pulmonary embolism: Left lower lobe with possible small amount of infiltrate or infarct. Admitted to floor. Etiology uncertain. Hypercoag panel unremarkable. Patient does have etonogestrel subdermal implant.  Pt started on xarelto 15mg  BID for 21 days to be complete on 7/27. On 01/26/13 will start xarelto 20mg  po daily. She continued with shortness of breath and chest pain until day of discharge. At discharge oxygen saturation level maintained >90% on room air with activity and pain within tolerable limits on oral analgesics. No signs of infection during this hospitalization. Bilateral LE dopplers obtained for completeness and yield no evidence of DVT. Of note, spoke to Dr. Emelda Fear regarding pt Norplant implant and he opined that this did not have to be removed. Pt desires it's removal. Have scheduled follow up appointment with his office. Pt to  be seen at [facility] 01/13/13 to establish with PCP for close follow up.    Active Problems:  Dyspnea:  Continued with dyspnea during hospitalization most likely related to pain. Oxygen saturation levels maintained >90% on room air. At discharge sats 96-100% on room air and respiratory effort normal.    Chest pain:  Continued with chest pain during hospitalization to the point pt refused to take deep breath. Provided  with incentive spirometry.  EKG without indications of ischemia. Was able to transition to po pain medicine 01/06/13 but with only fair relief. Mediations adjusted as indicated. At discharge pain improved and managed with oral analgesic.      Procedures:  none  Consultations:  none  Discharge Exam: Filed Vitals:   01/07/13 1540 01/07/13 2144 01/08/13 0425 01/08/13 1338  BP:  114/78 126/82 125/78  Pulse:  73 63 74  Temp:  98.2 F (36.8 C) 98.1 F (36.7 C) 98.1 F (36.7 C)  TempSrc:  Oral Oral Oral  Resp:  20 20 20   Height:      Weight:      SpO2: 97% 99% 100% 100%    General: obese NAD Cardiovascular: RRR No MGR No LE edema Respiratory: mild increased work of breathing with conversation. BS slightly diminished in bases otherwise clear. No wheeze no rhonchi  Discharge Instructions      Discharge Orders   Future Appointments Provider Department Dept Phone   01/23/2013 11:00 AM Tilda Burrow, MD [facility] [phone]   Future Orders Complete By Expires     Call MD for:  difficulty breathing, headache or visual disturbances  As directed     Diet - low sodium heart healthy  As directed     Discharge instructions  As directed     Comments:      Take medication as directed. Take xarelto 15mg  twice daily through 01/25/13 then on 01/26/13 start xarelto 20mg  daily. Recommend taking this medication at the same time every day. Be aware that missing a dose or stopping abruptly will increase risk for re-occurrence of clots.   Follow up with PCP as scheduled.  You have appointment Dr. Emelda Fear on 01/23/13 for removal of implant    Increase activity slowly  As directed         Medication List         HYDROcodone-acetaminophen 7.5-325 MG per tablet  Commonly known as:  NORCO  Take 1 tablet by mouth every 3 (three) hours as needed.     Rivaroxaban 15 MG Tabs tablet  Commonly known as:  XARELTO  Take on tab 2 times daily through 01/25/13. Last day of 21 day therapy is  01/25/13,     Rivaroxaban 20 MG Tabs  Commonly known as:  XARELTO  Take 1 tablet (20 mg total) by mouth daily. Start this medication at this dose on 01/26/13.       No Known Allergies Follow-up Information   Follow up with [facility] On 01/13/2013. (Arrive at 10:35, appt 10:45. Bring ID and Ins. card)    [facility] [address]  [phone]      Follow up with Tilda Burrow, MD On 01/23/2013. (has appoitment at 11am)    Contact information:   [address] [place] [address] [phone]        The results of significant diagnostics from this hospitalization (including imaging, microbiology, ancillary and laboratory) are listed below for reference.  Significant Diagnostic Studies: Ct Angio Chest Pe W/cm &/or Wo Cm  01/05/2013   *RADIOLOGY REPORT*  Clinical Data: Chest pain, left upper quadrant pain  CT ANGIOGRAPHY CHEST  Technique:  Multidetector CT imaging of the chest using the standard protocol during bolus administration of intravenous contrast. Multiplanar reconstructed images including MIPs were obtained and reviewed to evaluate the vascular anatomy.  Contrast: OMNIPAQUE IOHEXOL 350 MG/ML SOLN  Comparison: None.  Findings: Images of the thoracic inlet are unremarkable.  Central airways are patent.  No mediastinal hematoma or adenopathy.  Sagittal images of the spine are unremarkable.  Images of the lung parenchyma shows no pulmonary edema.  The study is of excellent technical quality.  There is a thrombus/pulmonary embolus in the left lower lobe arterial branch best seen in the coronal image 74.  The thrombosis extending in the at least two segmental arterial branches left lower lobe.  This is confirmed on coronal image 49.  Small amount of infiltrate or lung infarction noted in the left lower lobe laterally axial image 71.  No pulmonary edema.  No pleural effusion.  Visualized upper abdomen is unremarkable.  IMPRESSION:  1.   The study is positive for pulmonary embolus in the left lower lobe extending in at least two segmental arterial branches best seen in coronal image 74. Thrombus measures at least 2 cm in length.  Small amount of infiltrate or infarcted lung noted in left lower lobe laterally. 2.  No adenopathy. 3.  No pulmonary edema.  Critical findings discussed with Dr. Lincoln Maxin   Original Report Authenticated By: Natasha Mead, M.D.   Dg Chest Portable 1 View  01/05/2013   *RADIOLOGY REPORT*  Clinical Data: Chest pain  PORTABLE CHEST - 1 VIEW  Comparison: none  Findings: Normal heart size.  No pleural effusion or edema.  The lung volumes appear low.  No airspace consolidation.  IMPRESSION:  1.  Decreased lung volumes.   Original Report Authenticated By: Signa Kell, M.D.    Microbiology: No results found for this or any previous visit (from the past 240 hour(s)).   Labs: Basic Metabolic Panel:  Recent Labs Lab 01/05/13 0909  NA 138  K 3.9  CL 105  CO2 24  GLUCOSE 116*  BUN 8  CREATININE 0.74  CALCIUM 9.2   Liver Function Tests:  Recent Labs Lab 01/05/13 0909  AST 17  ALT 15  ALKPHOS 44  BILITOT 0.7  PROT 7.4  ALBUMIN 3.8    Recent Labs Lab 01/05/13 0909  LIPASE 29   No results found for this basename: AMMONIA,  in the last 168 hours CBC:  Recent Labs Lab 01/05/13 0909 01/08/13 0556  WBC 8.8 3.9*  NEUTROABS 6.0  --   HGB 13.6 12.7  HCT 38.7 36.5  MCV 90.6 91.5  PLT 244 269   Cardiac Enzymes:  Recent Labs Lab 01/05/13 0909  TROPONINI <0.30   BNP: BNP (last 3 results) No results found for this basename: PROBNP,  in the last 8760 hours CBG: No results found for this basename: GLUCAP,  in the last 168 hours     Signed:  Gwenyth Bender  [facility] 01/08/2013, 4:08 PM

## 2013-01-08 ENCOUNTER — Inpatient Hospital Stay (HOSPITAL_COMMUNITY): Payer: 59

## 2013-01-08 DIAGNOSIS — I2699 Other pulmonary embolism without acute cor pulmonale: Secondary | ICD-10-CM

## 2013-01-08 LAB — CBC
MCH: 31.8 pg (ref 26.0–34.0)
MCHC: 34.8 g/dL (ref 30.0–36.0)
Platelets: 269 10*3/uL (ref 150–400)
RBC: 3.99 MIL/uL (ref 3.87–5.11)

## 2013-01-08 MED ORDER — RIVAROXABAN 20 MG PO TABS: 20.0000 mg | ORAL_TABLET | Freq: Every day | ORAL | Status: DC

## 2013-01-08 MED ORDER — RIVAROXABAN 15 MG PO TABS: ORAL_TABLET | ORAL | Status: DC

## 2013-01-08 MED ORDER — HYDROCODONE-ACETAMINOPHEN 7.5-325 MG PO TABS: 1.0000 | ORAL_TABLET | ORAL | Status: DC | PRN

## 2013-01-08 NOTE — Progress Notes (Signed)
AVS reviewed with patient.  Prescriptions provided to patient.  Pt educated on Xarelto and s/s of bleeding.  Pt educated on follow-up at [facility] provided with address, phone number and appt time/date.  Pt provided with [employer].  Previously educated by pharmacy on Xarelto (education materials in room).  Pt's IV removed.  Site WNL.  Pt reports all belongings intact and in possession.  Pt transported by NT via w/c to main entrance for discharge.  Pt stable at time of discharge.

## 2013-01-08 NOTE — Progress Notes (Signed)
[facility] PROGRESS NOTE  Denise Khan VOZ:[id number] DOB: 1986-05-20 DOA: 01/05/2013 PCP: No PCP Per Patient  Assessment/Plan: 1. Acute left sided pulmonary embolism: Left lower lobe with possible small amount of infiltrate or infarct, no signs of infection. Possibly secondary to etonogestrel subdermal implant. Both mother and patient would like this removed. This can be arranged with Dr. Emelda Fear as an outpatient. She appears clinically stable for discharge. Continue Xarelto, close outpatient followup has been stressed. She needs to take this medication as instructed and she understands this. Consideration could be given to further evaluation for hypercoagulability after she has completed treatment for PE. This was discussed with patient and mother. Her hypercoagulability panel was nearly totally normal, a few nonspecific abnormalities of doubtful clinical significance can be followed up in the outpatient setting.  Home today. Following bilateral lower extremity Dopplers.  Code Status: full code DVT prophylaxis: Xarelto Family Communication: none present Disposition Plan: home   Brendia Sacks, MD  [facility]  Pager [phone] If 7PM-7AM, please contact night-coverage at [email or web address], password [email or web address] 01/08/2013, 10:30 AM  LOS: 3 days   Clinical Summary: 27 year old woman who developed chest pain shortness of breath with sudden onset. Further evaluation revealed acute pulmonary embolism. She was started on Xarelto.  Consultants:  none  Procedures:  none  HPI/Subjective: Feels better today. Less short of breath. Ambulating better. Pain better controlled. No lower extremity pain or swelling.  Objective: Filed Vitals:   01/07/13 1539 01/07/13 1540 01/07/13 2144 01/08/13 0425  BP: 119/75  114/78 126/82  Pulse: 77  73 63  Temp:   98.2 F (36.8 C) 98.1 F (36.7 C)  TempSrc:   Oral Oral  Resp:   20 20  Height:      Weight:      SpO2: 92% 97% 99% 100%     Intake/Output Summary (Last 24 hours) at 01/08/13 1030 Last data filed at 01/08/13 0800  Gross per 24 hour  Intake    960 ml  Output      0 ml  Net    960 ml     Filed Weights   01/05/13 0842 01/05/13 1348  Weight: 99.791 kg (220 lb) 102.059 kg (225 lb)    Exam:   General: Remains afebrile. Vitals stable. No hypoxia. Appears calm and comfortable. Overall appears better today.  Cardiovascular: Regular rate and rhythm. No lower extremity edema.   Telemetry: Sinus rhythm. No arrhythmias.  Respiratory: Clear to auscultation bilaterally. No wheezes, rales, rhonchi. Normal respiratory effort.  Musculoskeletal: Bilateral lower extremities unremarkable. Nontender.  Data Reviewed:  CBC unremarkable.  Pending studies:   Bilateral lower extremity Dopplers  Scheduled Meds: . Rivaroxaban  15 mg Oral BID WC  . sodium chloride  3 mL Intravenous Q12H   Continuous Infusions:   Principal Problem:   Acute pulmonary embolism Active Problems:   Dyspnea   Chest pain

## 2013-01-08 NOTE — Discharge Summary (Signed)
Seen and agree with discharge. See my progress note same date.  Brendia Sacks, MD [facility] [phone]

## 2013-01-19 ENCOUNTER — Emergency Department (HOSPITAL_COMMUNITY)
Admission: EM | Admit: 2013-01-19 | Discharge: 2013-01-19 | Disposition: A | Discharge status: Home / Self Care | Payer: 59 | Attending: Emergency Medicine | Admitting: Emergency Medicine

## 2013-01-19 ENCOUNTER — Emergency Department (HOSPITAL_COMMUNITY): Payer: 59

## 2013-01-19 ENCOUNTER — Encounter (HOSPITAL_COMMUNITY): Payer: Self-pay

## 2013-01-19 DIAGNOSIS — R42 Dizziness and giddiness: Secondary | ICD-10-CM | POA: Insufficient documentation

## 2013-01-19 DIAGNOSIS — Z86711 Personal history of pulmonary embolism: Secondary | ICD-10-CM | POA: Insufficient documentation

## 2013-01-19 DIAGNOSIS — R51 Headache: Secondary | ICD-10-CM | POA: Insufficient documentation

## 2013-01-19 DIAGNOSIS — R079 Chest pain, unspecified: Secondary | ICD-10-CM | POA: Insufficient documentation

## 2013-01-19 DIAGNOSIS — R0989 Other specified symptoms and signs involving the circulatory and respiratory systems: Secondary | ICD-10-CM | POA: Insufficient documentation

## 2013-01-19 DIAGNOSIS — R5383 Other fatigue: Secondary | ICD-10-CM | POA: Insufficient documentation

## 2013-01-19 DIAGNOSIS — R5381 Other malaise: Secondary | ICD-10-CM | POA: Insufficient documentation

## 2013-01-19 DIAGNOSIS — Z87891 Personal history of nicotine dependence: Secondary | ICD-10-CM | POA: Insufficient documentation

## 2013-01-19 DIAGNOSIS — I2699 Other pulmonary embolism without acute cor pulmonale: Secondary | ICD-10-CM

## 2013-01-19 DIAGNOSIS — R0600 Dyspnea, unspecified: Secondary | ICD-10-CM

## 2013-01-19 DIAGNOSIS — Z79899 Other long term (current) drug therapy: Secondary | ICD-10-CM | POA: Insufficient documentation

## 2013-01-19 DIAGNOSIS — R0602 Shortness of breath: Secondary | ICD-10-CM | POA: Insufficient documentation

## 2013-01-19 DIAGNOSIS — R0609 Other forms of dyspnea: Secondary | ICD-10-CM | POA: Insufficient documentation

## 2013-01-19 HISTORY — DX: Other pulmonary embolism without acute cor pulmonale: I26.99

## 2013-01-19 LAB — CBC WITH DIFFERENTIAL/PLATELET
Eosinophils Absolute: 0.1 10*3/uL (ref 0.0–0.7)
Lymphocytes Relative: 40 % (ref 12–46)
Lymphs Abs: 2.6 10*3/uL (ref 0.7–4.0)
Neutro Abs: 2.9 10*3/uL (ref 1.7–7.7)
Neutrophils Relative %: 45 % (ref 43–77)
Platelets: 329 10*3/uL (ref 150–400)
RBC: 4.38 MIL/uL (ref 3.87–5.11)
WBC: 6.5 10*3/uL (ref 4.0–10.5)

## 2013-01-19 LAB — POCT I-STAT TROPONIN I: Troponin i, poc: 0 ng/mL (ref 0.00–0.08)

## 2013-01-19 LAB — BASIC METABOLIC PANEL
Calcium: 9.7 mg/dL (ref 8.4–10.5)
GFR calc Af Amer: >90 mL/min (ref >90)
GFR calc non Af Amer: >90 mL/min (ref >90)
Sodium: 136 mEq/L (ref 135–145)

## 2013-01-19 MED ORDER — SODIUM CHLORIDE 0.9 % IV BOLUS (SEPSIS)
1000.0000 mL | Freq: Once | INTRAVENOUS | Status: AC
  Administered 2013-01-19: 1000 mL via INTRAVENOUS

## 2013-01-19 MED ORDER — HYDROCODONE-ACETAMINOPHEN 5-325 MG PO TABS: 2.0000 | ORAL_TABLET | Freq: Four times a day (QID) | ORAL | Status: DC | PRN

## 2013-01-19 MED ORDER — HYDROMORPHONE HCL PF 1 MG/ML IJ SOLN
1.0000 mg | Freq: Once | INTRAMUSCULAR | Status: AC
  Administered 2013-01-19: 1 mg via INTRAVENOUS
  Filled 2013-01-19: qty 1

## 2013-01-19 MED ORDER — DIPHENHYDRAMINE HCL 25 MG PO CAPS
ORAL_CAPSULE | ORAL | Status: AC
  Filled 2013-01-19: qty 2

## 2013-01-19 MED ORDER — METOCLOPRAMIDE HCL 5 MG/ML IJ SOLN
10.0000 mg | Freq: Once | INTRAMUSCULAR | Status: AC
  Administered 2013-01-19: 10 mg via INTRAVENOUS
  Filled 2013-01-19: qty 2

## 2013-01-19 MED ORDER — DIPHENHYDRAMINE HCL 50 MG/ML IJ SOLN
25.0000 mg | Freq: Once | INTRAMUSCULAR | Status: AC
  Administered 2013-01-19: 25 mg via INTRAVENOUS
  Filled 2013-01-19: qty 1

## 2013-01-19 MED ORDER — DIPHENHYDRAMINE HCL 25 MG PO CAPS
50.0000 mg | ORAL_CAPSULE | Freq: Once | ORAL | Status: AC
  Administered 2013-01-19: 50 mg via ORAL

## 2013-01-19 NOTE — ED Provider Notes (Signed)
History  This chart was scribed for Hurman Horn, MD, by Yevette Edwards, ED Scribe. This patient was seen in room APA01/APA01 and the patient's care was started at 2:36 PM  CSN: [id number] Arrival date & time 01/19/13  1409  First MD Initiated Contact with Patient 01/19/13 1434     Chief Complaint  Patient presents with  . Chest Pain  . Shortness of Breath    Patient is a 27 y.o. female presenting with shortness of breath. The history is provided by the patient. No language interpreter was used.  Shortness of Breath  HPI Comments: Denise Khan is a 27 y.o. female, with a h/o of PE, who presents to the Emergency Department complaining of chest pain and SOB which have been constant since she experienced an episode of PE two weeks ago. The pt reports that her chest pain became suddenly severe this morning, and that her pain is similar to her previous episode of PE. She has also experienced weakness and lightheadedness. Additionally, she has experienced  constant, waxing and waning headaches which have been occurring since her initial episode of PE. She reports that her prescribed pain medication helps the headache minimally, and she reports not taking narcotic medication for the past week.  She denies abdominal pain, hematochezia, hematuria, confusion, and no changes in speech or vision. She is a former smoker, but she denies using alcohol.   Past Medical History  Diagnosis Date  . PE (pulmonary embolism)    Past Surgical History  Procedure Laterality Date  . Knee surgery     No family history on file. History  Substance Use Topics  . Smoking status: Former Games developer  . Smokeless tobacco: Former Neurosurgeon    Quit date: 11/06/2009  . Alcohol Use: No   No OB history provided. Review of Systems  Respiratory: Positive for shortness of breath.    10 Systems reviewed and are negative for acute change except as noted in the HPI.  Allergies  Review of patient's allergies indicates no  known allergies.  Home Medications   Current Outpatient Rx  Name  Route  Sig  Dispense  Refill  . acetaminophen-codeine (TYLENOL #3) 300-30 MG per tablet   Oral   Take 1 tablet by mouth every 4 (four) hours as needed for pain.         Marland Kitchen etonogestrel (IMPLANON) 68 MG IMPL implant   Subcutaneous   Inject 1 each into the skin once.         Marland Kitchen PARoxetine (PAXIL) 20 MG tablet   Oral   Take 20 mg by mouth daily.         . rivaroxaban (XARELTO) 15 MG TABS tablet      Take on tab 2 times daily through 01/25/13. Last day of 21 day therapy is 01/25/13,   37 tablet   0   . HYDROcodone-acetaminophen (NORCO) 5-325 MG per tablet   Oral   Take 2 tablets by mouth every 6 (six) hours as needed for pain.   20 tablet   0    Triage Vitals: BP 153/80  Pulse 88  Temp(Src) 97.5 F (36.4 C) (Oral)  SpO2 100%  LMP 01/05/2013  Physical Exam  Nursing note and vitals reviewed. Constitutional: She is oriented to person, place, and time.  Awake, alert, nontoxic appearance with baseline speech for patient.  HENT:  Head: Normocephalic and atraumatic.  Mouth/Throat: No oropharyngeal exudate.  Eyes: EOM are normal. Pupils are equal, round, and reactive to  light. Right eye exhibits no discharge. Left eye exhibits no discharge.  Neck: Neck supple.  Cardiovascular: Normal rate, regular rhythm and normal heart sounds.   No murmur heard. Pulmonary/Chest: Effort normal and breath sounds normal. No stridor. No respiratory distress. She has no wheezes. She has no rales. She exhibits tenderness.  Left lower chest wall reproducible tenderness with no rash.   Abdominal: Soft. Bowel sounds are normal. She exhibits no mass. There is no tenderness. There is no rebound.  Musculoskeletal: She exhibits no edema and no tenderness.  Baseline ROM, moves extremities with no obvious new focal weakness.  Lymphadenopathy:    She has no cervical adenopathy.  Neurological: She is alert and oriented to person, place,  and time.  Awake, alert, cooperative and aware of situation; motor strength bilaterally; sensation normal to light touch bilaterally; peripheral visual fields full to confrontation; no facial asymmetry; tongue midline; major cranial nerves appear intact; no pronator drift, normal finger to nose bilaterally.  Skin: No rash noted.  Psychiatric: She has a normal mood and affect.    ED Course  Procedures (including critical care time) ECG: Normal sinus rhythm, ventricular rate 79, normal axis, normal intervals, no acute ischemic changes noted, impression normal ECG, no significant change noted compared with 01/05/2013 DIAGNOSTIC STUDIES: Oxygen Saturation is 100% on room air, normal by my interpretation.    COORDINATION OF CARE:  2:43 PM-Patient / Family / Caregiver understand and agree with initial ED impression and plan with expectations set for ED visit.  Echo report pending; called [place] office prior to 1700 and told it would be read today. 1830 D/w [facility] who is unable to access echo to interpret tonight; Pt stable in ED feel OutPt result reasonable. 2100  Labs Reviewed  CBC WITH DIFFERENTIAL - Abnormal; Notable for the following:    Monocytes Relative 13 (*)    All other components within normal limits  BASIC METABOLIC PANEL  POCT I-STAT TROPONIN I   No results found. 1. Acute pulmonary embolism   2. Dyspnea   3. Chest pain     MDM  I doubt any other EMC precluding discharge at this time including, but not necessarily limited to the following:massive PE with hemodynamic instability. I personally performed the services described in this documentation, which was scribed in my presence. The recorded information has been reviewed and is accurate.       Hurman Horn, MD 01/21/13 2149

## 2013-01-19 NOTE — ED Notes (Signed)
Notified Dr. Fonnie Jarvis of pt.

## 2013-01-19 NOTE — ED Notes (Signed)
Patient began complaining of mild itching at discharge. Reports started approximately 20 minutes ago. Dr. Fonnie Jarvis notified and stated to give 50 mg PO of Benadryl then patient is to be discharged home.

## 2013-01-19 NOTE — ED Notes (Signed)
Pt reports was diagnosed with PE 2 weeks ago.  Reports was feeling better but still hurting some.  Reports approx 45 min ago pain became severe in chest and back and pt SOB.

## 2013-01-19 NOTE — ED Notes (Signed)
Dr. Fonnie Jarvis speaking with patient and patient's family at this time.

## 2013-01-19 NOTE — Progress Notes (Signed)
*  PRELIMINARY RESULTS* Echocardiogram 2D Echocardiogram has been performed.  Denise Khan 01/19/2013, 3:31 PM

## 2013-01-21 ENCOUNTER — Encounter (HOSPITAL_COMMUNITY): Payer: Self-pay | Admitting: Emergency Medicine

## 2013-01-21 ENCOUNTER — Emergency Department (HOSPITAL_COMMUNITY)
Admission: EM | Admit: 2013-01-21 | Discharge: 2013-01-22 | Disposition: A | Discharge status: Home / Self Care | Payer: 59 | Attending: Emergency Medicine | Admitting: Emergency Medicine

## 2013-01-21 ENCOUNTER — Emergency Department (HOSPITAL_COMMUNITY): Payer: 59

## 2013-01-21 DIAGNOSIS — Z79899 Other long term (current) drug therapy: Secondary | ICD-10-CM | POA: Insufficient documentation

## 2013-01-21 DIAGNOSIS — R0602 Shortness of breath: Secondary | ICD-10-CM

## 2013-01-21 DIAGNOSIS — R0789 Other chest pain: Secondary | ICD-10-CM | POA: Insufficient documentation

## 2013-01-21 DIAGNOSIS — Z86711 Personal history of pulmonary embolism: Secondary | ICD-10-CM | POA: Insufficient documentation

## 2013-01-21 DIAGNOSIS — Z87891 Personal history of nicotine dependence: Secondary | ICD-10-CM | POA: Insufficient documentation

## 2013-01-21 DIAGNOSIS — R5381 Other malaise: Secondary | ICD-10-CM | POA: Insufficient documentation

## 2013-01-21 LAB — CBC WITH DIFFERENTIAL/PLATELET
Basophils Absolute: 0 10*3/uL (ref 0.0–0.1)
Eosinophils Relative: 3 % (ref 0–5)
HCT: 38.8 % (ref 36.0–46.0)
Hemoglobin: 14 g/dL (ref 12.0–15.0)
Lymphocytes Relative: 43 % (ref 12–46)
MCV: 89 fL (ref 78.0–100.0)
Monocytes Absolute: 0.4 10*3/uL (ref 0.1–1.0)
Monocytes Relative: 7 % (ref 3–12)
Neutro Abs: 2.5 10*3/uL (ref 1.7–7.7)
RDW: 12 % (ref 11.5–15.5)
WBC: 5.4 10*3/uL (ref 4.0–10.5)

## 2013-01-21 LAB — COMPREHENSIVE METABOLIC PANEL
BUN: 12 mg/dL (ref 6–23)
CO2: 26 mEq/L (ref 19–32)
Calcium: 9.5 mg/dL (ref 8.4–10.5)
Chloride: 103 mEq/L (ref 96–112)
Creatinine, Ser: 0.82 mg/dL (ref 0.50–1.10)
GFR calc Af Amer: >90 mL/min (ref >90)
GFR calc non Af Amer: >90 mL/min (ref >90)
Glucose, Bld: 133 mg/dL — ABNORMAL HIGH (ref 70–99)
Total Bilirubin: 0.4 mg/dL (ref 0.3–1.2)

## 2013-01-21 LAB — POCT I-STAT TROPONIN I: Troponin i, poc: 0 ng/mL (ref 0.00–0.08)

## 2013-01-21 LAB — PROTIME-INR: INR: 2.08 — ABNORMAL HIGH (ref 0.00–1.49)

## 2013-01-21 LAB — D-DIMER, QUANTITATIVE: D-Dimer, Quant: 0.37 ug/mL-FEU (ref 0.00–0.48)

## 2013-01-21 NOTE — ED Notes (Signed)
PT. REPORTS LEFT CHEST PAIN RADIAITING TO LEFT BACK FOR SEVERAL WEEKS SEEN AT [facility] 2 WEEKS AGO DIAGNOSED WITH PE. SLIGHT SOB , DENIES NAUSEA OR DIAPHORESIS .

## 2013-01-22 ENCOUNTER — Emergency Department (HOSPITAL_COMMUNITY): Payer: 59

## 2013-01-22 ENCOUNTER — Encounter (HOSPITAL_COMMUNITY): Payer: Self-pay | Admitting: Radiology

## 2013-01-22 MED ORDER — SODIUM CHLORIDE 0.9 % IV BOLUS (SEPSIS)
1000.0000 mL | Freq: Once | INTRAVENOUS | Status: AC
  Administered 2013-01-22: 1000 mL via INTRAVENOUS

## 2013-01-22 MED ORDER — IOHEXOL 350 MG/ML SOLN
100.0000 mL | Freq: Once | INTRAVENOUS | Status: AC | PRN
  Administered 2013-01-22: 100 mL via INTRAVENOUS

## 2013-01-22 MED ORDER — OXYCODONE-ACETAMINOPHEN 5-325 MG PO TABS: ORAL_TABLET | ORAL | Status: DC

## 2013-01-22 MED ORDER — METOCLOPRAMIDE HCL 5 MG/ML IJ SOLN
10.0000 mg | Freq: Once | INTRAMUSCULAR | Status: AC
  Administered 2013-01-22: 10 mg via INTRAVENOUS
  Filled 2013-01-22: qty 2

## 2013-01-22 MED ORDER — HYDROMORPHONE HCL PF 1 MG/ML IJ SOLN
1.0000 mg | Freq: Once | INTRAMUSCULAR | Status: AC
  Administered 2013-01-22: 1 mg via INTRAVENOUS
  Filled 2013-01-22: qty 1

## 2013-01-22 NOTE — Discharge Instructions (Signed)
Follow with Dr. Emelda Fear in your appointment tomorrow. Continue to take your [place] as directed.  Please contact your primary care doctor and let them know that you were seen in the emergency room. They must obtain records for evaluation and further management.   Follow with your primary care doctor for a check up in the next 24-48 hours.  Return to the emergency room IMMEDIATELY if you have any NEW or WORSENING symptoms.    Dyspnea Shortness of breath (dyspnea) is the feeling of uneasy breathing. Dyspnea should be evaluated promptly. DIAGNOSIS  Many tests may be done to find why you are having shortness of breath. Tests may include:  A chest X-ray.   A lung function test.   Blood tests.   Recordings of the electrical activity of the heart (electrocardiogram).   Exercise testing.   Sound wave images of the heart (a cardiac echocardiogram).   A scan.  A cause for your shortness of breath may not be identified initially. In this case, it is important to have a follow-up exam with your caregiver. HOME CARE INSTRUCTIONS   Do not smoke. Smoking is a common cause of shortness of breath. Ask for help to stop smoking.   Avoid being around chemicals that may bother your breathing, such as paint fumes or dust.   Rest as needed. Slowly begin your usual activities.   If medications were prescribed, take them as directed for the full length of time directed. This includes oxygen and any inhaled medications, if prescribed.   It is very important that you follow up with your caregiver or other physician as directed. Waiting to do so or failure to follow up could result in worsening of your condition, possible disability, or death.   Be sure you understand what to do or who to call if your shortness of breath worsens.  SEEK MEDICAL CARE IF:   Your condition does not improve in the time expected.   You have a hard time doing your normal activities even with rest.   You have any side  effects from or problems with medications prescribed.  SEEK IMMEDIATE MEDICAL CARE IF:   You feel your shortness of breath is getting worse.   You feel lightheaded, faint or develop a cough not controlled with medications.   You start coughing up blood.   You get pain with breathing.   You get chest pain or pain in your arms, shoulders or belly (abdomen).   You have a fever.   You are unable to walk up stairs or exercise the way you normally can.  MAKE SURE YOU:   Understand these instructions.   Will watch your condition.   Will get help right away if you are not doing well or get worse.  Document Released: 07/26/2004 Document Revised: 02/28/2011 Document Reviewed: 11/03/2009 [facility] Patient Information 2012 [place], [place].

## 2013-01-22 NOTE — ED Provider Notes (Signed)
Medical screening examination/treatment/procedure(s) were performed by non-physician practitioner and as supervising physician I was immediately available for consultation/collaboration.   Hanley Seamen, MD 01/22/13 [phone]

## 2013-01-22 NOTE — ED Provider Notes (Signed)
History    CSN: [id number] Arrival date & time 01/21/13  2132  First MD Initiated Contact with Patient 01/21/13 2338     Chief Complaint  Patient presents with  . Chest Pain   (Consider location/radiation/quality/duration/timing/severity/associated sxs/prior Treatment) HPI Comments: Admit to hospital 01/05/13 27 y.o. Female with recent admit for PE (01/05/13) and a second PE (01/19/13) presenting with gradually worsening chest pain and shortness of breath. since leaving the hospital on 01/19/13.  Pt states the pain is presenting similarly at the pain for her PE: under her left breast, worsened with deep inspiration, radiating to her left back. Also describes epigastric pain. She describes her previous severity as 10/10 and today's severity as 8/10. Admits generalized weakness. She denies fevers, chills, nausea, vomiting, cough, leg swelling or pain. Pt states she has been compliant with her prescribed Xarelto, taking her most recent dose today. She has an appt to see Dr. Emelda Fear on 01/23/13, but began experiencing sx tonight.  She has been sedentary since leaving the hospital and is on the implanon injection for birth control. She has no other pertinent medical history. Review of records shows her family history is pertinent for father with CAD older than age 70.    Past Medical History  Diagnosis Date  . PE (pulmonary embolism)    Past Surgical History  Procedure Laterality Date  . Knee surgery     No family history on file. History  Substance Use Topics  . Smoking status: Former Games developer  . Smokeless tobacco: Former Neurosurgeon    Quit date: 11/06/2009  . Alcohol Use: No   OB History   Grav Para Term Preterm Abortions TAB SAB Ect Mult Living                 Review of Systems  Constitutional: Negative for fever, chills and diaphoresis.  HENT: Negative for neck pain and neck stiffness.   Eyes: Negative for visual disturbance.  Respiratory: Positive for shortness of breath. Negative for  apnea and chest tightness.   Cardiovascular: Positive for chest pain. Negative for palpitations and leg swelling.       Under left breast radiating to left back  Gastrointestinal: Negative for nausea, vomiting, diarrhea and constipation.  Genitourinary: Negative for dysuria.  Musculoskeletal: Negative for gait problem.  Skin: Negative for rash.  Neurological: Positive for weakness. Negative for dizziness, light-headedness, numbness and headaches.       Generalized weakness    Allergies  Review of patient's allergies indicates no known allergies.  Home Medications   Current Outpatient Rx  Name  Route  Sig  Dispense  Refill  . acetaminophen-codeine (TYLENOL #3) 300-30 MG per tablet   Oral   Take 1 tablet by mouth every 4 (four) hours as needed for pain.         Marland Kitchen etonogestrel (IMPLANON) 68 MG IMPL implant   Subcutaneous   Inject 1 each into the skin once.         Marland Kitchen HYDROcodone-acetaminophen (NORCO) 5-325 MG per tablet   Oral   Take 2 tablets by mouth every 6 (six) hours as needed for pain.   20 tablet   0   . PARoxetine (PAXIL) 20 MG tablet   Oral   Take 20 mg by mouth daily.         . rivaroxaban (XARELTO) 15 MG TABS tablet      Take on tab 2 times daily through 01/25/13. Last day of 21 day therapy is 01/25/13,  37 tablet   0    BP 152/102  Pulse 79  Temp(Src) 98.5 F (36.9 C) (Oral)  Resp 20  SpO2 100%  LMP 01/05/2013 Physical Exam  Nursing note and vitals reviewed. Constitutional: She is oriented to person, place, and time. She appears well-developed and well-nourished. No distress.  HENT:  Head: Normocephalic and atraumatic.  Eyes: Conjunctivae and EOM are normal.  Neck: Normal range of motion. Neck supple.  No meningeal signs  Cardiovascular: Normal rate, regular rhythm and normal heart sounds.  Exam reveals no gallop and no friction rub.   No murmur heard. Pulmonary/Chest: Effort normal and breath sounds normal. No respiratory distress. She has no  wheezes. She has no rales. She exhibits no tenderness.  Abdominal: Soft. Bowel sounds are normal. She exhibits no distension. There is no tenderness. There is no rebound and no guarding.  Musculoskeletal: Normal range of motion. She exhibits no edema and no tenderness.  FROM to upper and lower extremities  Neurological: She is alert and oriented to person, place, and time. No cranial nerve deficit.  Speech is clear and goal oriented, follows commands Sensation normal to light touch and two point discrimination Moves extremities without ataxia, coordination intact Weakness to upper and lower extremities bilaterally including dorsiflexion and plantar flexion, and grip strength which pt states is not her baseline.    Skin: Skin is warm and dry. She is not diaphoretic. No erythema.  Psychiatric:  anxious    ED Course  Procedures (including critical care time) Labs Reviewed  PROTIME-INR - Abnormal; Notable for the following:    Prothrombin Time 22.7 (*)    INR 2.08 (*)    All other components within normal limits  CBC WITH DIFFERENTIAL - Abnormal; Notable for the following:    MCHC 36.1 (*)    All other components within normal limits  COMPREHENSIVE METABOLIC PANEL - Abnormal; Notable for the following:    Glucose, Bld 133 (*)    All other components within normal limits  D-DIMER, QUANTITATIVE  POCT I-STAT TROPONIN I    Date: 01/22/2013  Rate: 86  Rhythm: normal sinus rhythm  QRS Axis: normal  Intervals: normal  ST/T Wave abnormalities: normal  Conduction Disutrbances:none  Narrative Interpretation: normal EGK  Old EKG Reviewed: unchanged   Dg Chest 2 View  01/21/2013   *RADIOLOGY REPORT*  Clinical Data: Chest pain  CHEST - 2 VIEW  Comparison: January 19, 2013  Findings: There is no focal infiltrate, pulmonary edema, or pleural effusion.  The mediastinal contour and cardiac silhouette are normal.  The soft tissue and osseous structures are normal.  IMPRESSION: No acute  cardiopulmonary disease identified.   Original Report Authenticated By: Sherian Rein, M.D.   No diagnosis found.  MDM  Dr. Read Drivers wants to evaluate size of PE which measured 2 cm on imaging from 01/05/13. PE located in the left lower lobe extending in at least two segmental arterial branches. Will order CT angio, troponin, EKG, chest xray, d dimer, INR, manage pain and re-evaluate.   INR and prothrombin are high at 2.08 and 22.7, respectively. EKG shows NSR with HR 86.   CT angio pending at sign out to [facility], PA-C. Treatment plan to be driven by CT angio results.   Glade Nurse, PA-C 01/22/13 0147  [place] Nurse, PA-C 01/22/13 [phone]

## 2013-01-22 NOTE — ED Provider Notes (Signed)
Is as a sign out from PA back at shift change: Denise Khan is a 27 y.o. female complaining of recently diagnosed with pulmonary embolism, compliant with these are also as an outpatient. Coming in today because pain is not improving and she has slight shortness of breath as well. Patient has an appointment to follow with her primary care physician tomorrow. Plan is to followup CTA to see if the embolus is enlarging.  CTA shows no evidence of any significant residual or recurrent PE. There is increased consolidation of focal infarct in the left lung base. I have discussed the results with attending, Dr. Read Drivers, agrees with care plan and plan and stability to discharge home.  Pt is hemodynamically stable, appropriate for, and amenable to discharge at this time. Pt verbalized understanding and agrees with care plan. Outpatient follow-up and specific return precautions discussed.         Wynetta Emery, PA-C 01/22/13 [phone]

## 2013-01-22 NOTE — ED Provider Notes (Signed)
Medical screening examination/treatment/procedure(s) were performed by non-physician practitioner and as supervising physician I was immediately available for consultation/collaboration.   Hanley Seamen, MD 01/22/13 1212

## 2013-01-23 ENCOUNTER — Encounter: Payer: Self-pay | Admitting: Obstetrics and Gynecology

## 2013-02-09 ENCOUNTER — Other Ambulatory Visit (HOSPITAL_COMMUNITY): Payer: Self-pay | Admitting: Pulmonary Disease

## 2013-02-09 DIAGNOSIS — I2699 Other pulmonary embolism without acute cor pulmonale: Secondary | ICD-10-CM

## 2013-02-10 ENCOUNTER — Ambulatory Visit (HOSPITAL_COMMUNITY)
Admission: RE | Admit: 2013-02-10 | Discharge: 2013-02-10 | Disposition: A | Discharge status: Home / Self Care | Payer: 59 | Source: Ambulatory Visit | Attending: Pulmonary Disease | Admitting: Pulmonary Disease

## 2013-02-10 DIAGNOSIS — R059 Cough, unspecified: Secondary | ICD-10-CM | POA: Insufficient documentation

## 2013-02-10 DIAGNOSIS — I2699 Other pulmonary embolism without acute cor pulmonale: Secondary | ICD-10-CM

## 2013-02-10 DIAGNOSIS — R05 Cough: Secondary | ICD-10-CM | POA: Insufficient documentation

## 2013-02-10 DIAGNOSIS — R079 Chest pain, unspecified: Secondary | ICD-10-CM | POA: Insufficient documentation

## 2013-02-10 MED ORDER — IOHEXOL 350 MG/ML SOLN
100.0000 mL | Freq: Once | INTRAVENOUS | Status: AC | PRN
  Administered 2013-02-10: 100 mL via INTRAVENOUS

## 2013-02-17 ENCOUNTER — Encounter: Payer: Self-pay | Admitting: *Deleted

## 2013-02-17 ENCOUNTER — Encounter: Payer: Self-pay | Admitting: Cardiology

## 2013-02-17 ENCOUNTER — Ambulatory Visit (INDEPENDENT_AMBULATORY_CARE_PROVIDER_SITE_OTHER): Payer: 59 | Admitting: Cardiology

## 2013-02-17 VITALS — BP 137/87 | HR 85 | Ht 70.0 in | Wt 226.2 lb

## 2013-02-17 DIAGNOSIS — R0602 Shortness of breath: Secondary | ICD-10-CM

## 2013-02-17 DIAGNOSIS — Z86711 Personal history of pulmonary embolism: Secondary | ICD-10-CM

## 2013-02-17 DIAGNOSIS — R0789 Other chest pain: Secondary | ICD-10-CM

## 2013-02-17 NOTE — Assessment & Plan Note (Signed)
Followed by Dr. Juanetta Gosling, on Xarelto, 6 month course planned. Reportedly negative hypercoagulable workup.

## 2013-02-17 NOTE — Patient Instructions (Addendum)
Your physician recommends that you schedule a follow-up appointment in:AS NEEDED  Your physician has requested that you have a dobutamine echocardiogram. For further information please visit [email or web address] Please follow instruction sheet as given.WE WILL CALL YOU WITH THE RESULTS

## 2013-02-17 NOTE — Assessment & Plan Note (Signed)
Patient referred given concerns about continued, changing chest pain symptoms. She had a recently diagnosed pulmonary embolus with apparent pulmonary infarct as outlined above. Recent chest CTA showed no residual thrombus with clearing infiltrate at the left lateral base, presumably related to the prior pulmonary infarct. Certainly, some of her symptoms may still be related to this resolving process. Description of chest pain is atypical for ischemic etiology. She could in fact have esophageal irritation or gastritis with recent medical treatment. A dobutamine echocardiogram will be obtained to more objectively exclude ischemia, although doubt this as on etiology. Will inform her of the results. I also asked her to get an over-the-counter acid suppressant to see if this helped empirically.

## 2013-02-17 NOTE — Progress Notes (Signed)
Clinical Summary Denise Khan is a 27 y.o.female referred for cardiology consultation by Dr. Juanetta Gosling. Record review finds recent diagnosis of pulmonary embolus in July of this year, reportedly with negative hypercoagulable workup. She had no evidence of DVT by lower extremity Dopplers. Initial chest CTA demonstrated pulmonary embolus in the left lower lobe extending into two segmental branches associated with small amount of infiltrate or pulmonary infarct of the left lower lobe laterally.  Since that time she has had intermittent episodes of left and right-sided chest discomfort, more recently central to bilateral chest pain symptoms, fairly constant, not necessarily exacerbated by activity. Also feels short of breath with activity. She states it feels somewhat like indigestion. Also reports intermittent improvement, was on a course of steroids, also pain medications.  She has had followup chest CTA evaluations, most recently on 8/12 demonstrating partial but incomplete clearing of infiltrates in the left lateral base, no residual pulmonary embolus.  Echocardiogram from July 21, read by Dr. Dietrich Pates, indicated LVEF 55-60% without regional wall motion abnormalities, normal RV chamber size and contraction. Troponin I levels were normal during hospitalization. ECG reviewed, at that time showing normal sinus rhythm.  No Known Allergies  Current Outpatient Prescriptions  Medication Sig Dispense Refill  . Cetirizine HCl (ZYRTEC PO) Take 1 tablet by mouth daily.      Marland Kitchen oxyCODONE-acetaminophen (PERCOCET/ROXICET) 5-325 MG per tablet 1 to 2 tabs PO q6hrs  PRN for pain  15 tablet  0  . PARoxetine (PAXIL) 20 MG tablet Take 20 mg by mouth daily.      . Rivaroxaban (XARELTO) 20 MG TABS tablet Take 20 mg by mouth daily.       No current facility-administered medications for this visit.    Past Medical History  Diagnosis Date  . PE (pulmonary embolism)     Past Surgical History  Procedure Laterality  Date  . Knee surgery      Family History  Problem Relation Age of Onset  . CAD Father   . Hypertension Father   . Hypertension Mother     Social History Denise Khan reports that she has quit smoking. Her smoking use included Cigarettes. She smoked 0.00 packs per day. She quit smokeless tobacco use about 3 years ago. Denise Khan reports that she does not drink alcohol.  Review of Systems Had no palpitations or syncope. No cough or hemoptysis. No fevers or chills. Reports compliance with her medications. Otherwise negative.  Physical Examination Filed Vitals:   02/17/13 1353  BP: 137/87  Pulse: 85   Filed Weights   02/17/13 1353  Weight: 226 lb 4 oz (102.626 kg)   No acute distress. HEENT: Conjunctiva and lids normal, oropharynx clear. Neck: Supple, no elevated JVP or carotid bruits, no thyromegaly. Lungs: Clear to auscultation, no obvious rhonchi or egophony, no wheezing,,nonlabored breathing at rest. No pleural rub. Cardiac: Regular rate and rhythm, no S3 or significant systolic murmur, no pericardial rub. Abdomen: Soft, nontender, bowel sounds present, no guarding or rebound. Extremities: No pitting edema, distal pulses 2+. Skin: Warm and dry. Musculoskeletal: No kyphosis. Neuropsychiatric: Alert and oriented x3, affect grossly appropriate.   Problem List and Plan   Atypical chest pain Patient referred given concerns about continued, changing chest pain symptoms. She had a recently diagnosed pulmonary embolus with apparent pulmonary infarct as outlined above. Recent chest CTA showed no residual thrombus with clearing infiltrate at the left lateral base, presumably related to the prior pulmonary infarct. Certainly, some of her symptoms may still be  related to this resolving process. Description of chest pain is atypical for ischemic etiology. She could in fact have esophageal irritation or gastritis with recent medical treatment. A dobutamine echocardiogram will be  obtained to more objectively exclude ischemia, although doubt this as on etiology. Will inform her of the results. I also asked her to get an over-the-counter acid suppressant to see if this helped empirically.  History of pulmonary embolus (PE) Followed by Dr. Juanetta Gosling, on Xarelto, 6 month course planned. Reportedly negative hypercoagulable workup.    Jonelle Sidle, M.D., F.A.C.C.

## 2013-02-18 ENCOUNTER — Ambulatory Visit: Payer: 59 | Admitting: Cardiology

## 2013-02-19 ENCOUNTER — Ambulatory Visit (HOSPITAL_COMMUNITY)
Admission: RE | Admit: 2013-02-19 | Discharge: 2013-02-19 | Disposition: A | Discharge status: Home / Self Care | Payer: 59 | Source: Ambulatory Visit | Attending: Cardiology | Admitting: Cardiology

## 2013-02-19 ENCOUNTER — Encounter (HOSPITAL_COMMUNITY): Payer: Self-pay

## 2013-02-19 DIAGNOSIS — R0789 Other chest pain: Secondary | ICD-10-CM

## 2013-02-19 DIAGNOSIS — R0609 Other forms of dyspnea: Secondary | ICD-10-CM | POA: Insufficient documentation

## 2013-02-19 DIAGNOSIS — R0989 Other specified symptoms and signs involving the circulatory and respiratory systems: Secondary | ICD-10-CM | POA: Insufficient documentation

## 2013-02-19 DIAGNOSIS — R072 Precordial pain: Secondary | ICD-10-CM

## 2013-02-19 DIAGNOSIS — R0602 Shortness of breath: Secondary | ICD-10-CM

## 2013-02-19 MED ORDER — ATROPINE SULFATE 1 MG/ML IJ SOLN
INTRAMUSCULAR | Status: AC
  Administered 2013-02-19: 0.5 mg via INTRAVENOUS
  Filled 2013-02-19: qty 1

## 2013-02-19 MED ORDER — METOPROLOL TARTRATE 1 MG/ML IV SOLN
INTRAVENOUS | Status: AC
  Administered 2013-02-19: 5 mg via INTRAVENOUS
  Filled 2013-02-19: qty 5

## 2013-02-19 MED ORDER — DOBUTAMINE IN D5W 4-5 MG/ML-% IV SOLN
INTRAVENOUS | Status: AC
  Filled 2013-02-19: qty 250

## 2013-02-19 NOTE — Progress Notes (Signed)
Stress Lab Nurses Notes - Denise Khan 02/19/2013 Reason for doing test: Chest Pain Type of test: Dobutamine Echo Nurse performing test: Parke Poisson, RN Nuclear Medicine Tech: Not Applicable Echo Tech: Karrie Doffing MD performing test: Dr. Purvis Sheffield / Joni Reining NP Family MD: Juanetta Gosling Test explained and consent signed: yes IV started: 20g jelco, NS 250 cc, KVO and No redness or edema Symptoms: Nausea, chest pressure, SOB (had inhaler from home,albuterol taken) Treatment/Intervention: Atropine .25mg  mg IV and Atropine .25mg  IV push and Lopressor 5mg  IV push Reason test stopped: reached target HR After recovery IV was: Discontinued and No redness or edema Patient to return to Nuc. Med at : NA Patient discharged: Home Patient's Condition upon discharge was: stable Comments: During test peak BP 220/96 & HR 160.  Recovery BP 108/77 & HR 88.  Symptoms resolved in recovery. Erskine Speed T

## 2013-02-19 NOTE — Progress Notes (Signed)
*  PRELIMINARY RESULTS* Echocardiogram Echocardiogram Pharmacologic Stress Test has been performed.  Conrad Apex 02/19/2013, 10:07 AM

## 2013-10-14 ENCOUNTER — Telehealth: Payer: Self-pay | Admitting: *Deleted

## 2013-10-18 IMAGING — CT CT ANGIO CHEST
1 of 6 series · 5 of 36 positions shown · IV contrast (Omnipaque 300)
Comparison: January 22, 2013

CLINICAL DATA: Chest pain and cough

CT ANGIOGRAPHY CHEST
TECHNIQUE: Multidetector CT imaging of the chest using the
standard protocol during bolus administration of intravenous
contrast. Multiplanar reconstructed images including MIPs were
obtained and reviewed to evaluate the vascular anatomy.
Contrast: 100mL OMNIPAQUE IOHEXOL 350 MG/ML SOLN

[Series 4: pe 3.0 b40f · axial · 0.67mm/px · z∈[-217,-40]mm · 5 of 89 slices shown]
[im 15/89  lung]
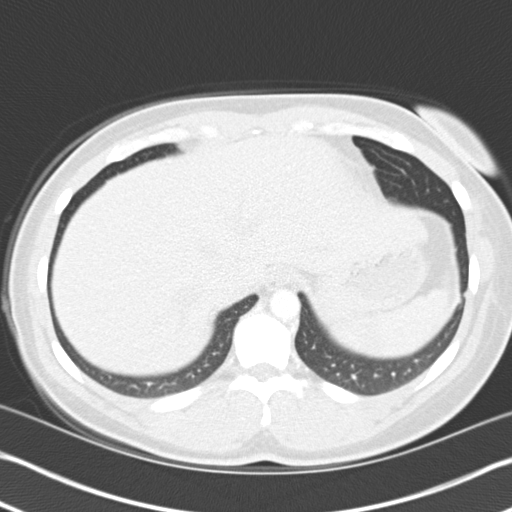
[im 30/89  mediastinal]
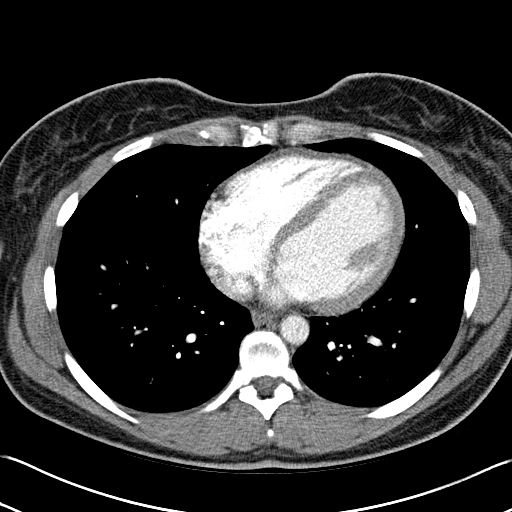
[im 45/89  lung]
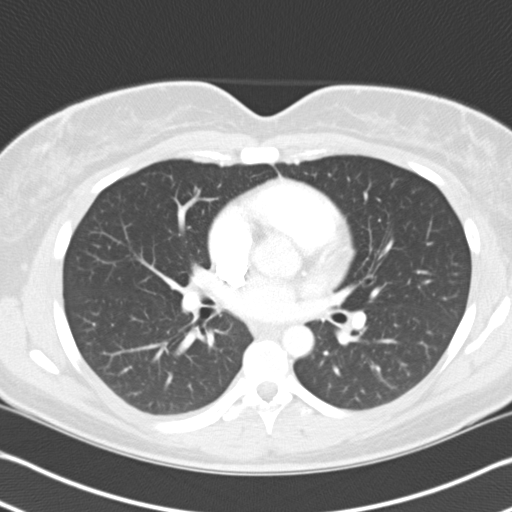
[im 59/89  mediastinal]
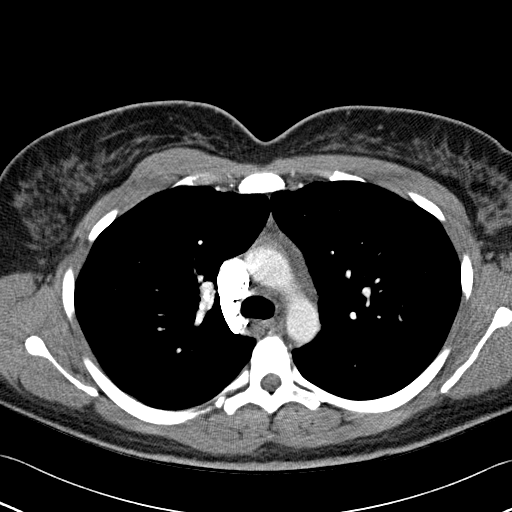
[im 74/89  lung]
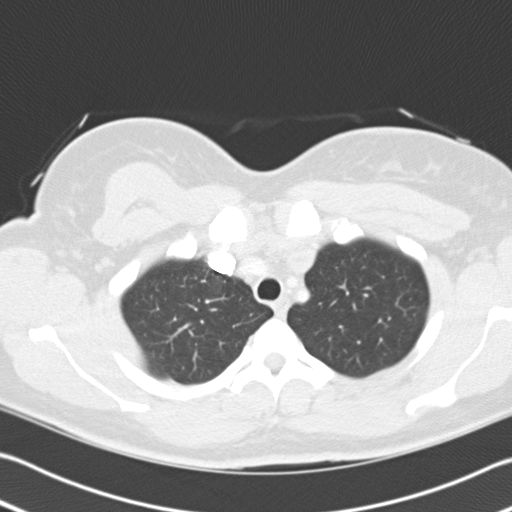

[5 of 36 positions shown; findings below may reference images not displayed]

FINDINGS: There is no demonstrable pulmonary embolus.  There is no
thoracic aortic aneurysm or dissection.

There has been partial but incomplete clearing of infiltrate from
the lateral left base.  There is no new opacity on either side.
There is no demonstrable adenopathy.  Pericardium is not thickened.

Visualized upper abdominal structures appear normal.  There are no
blastic or lytic bone lesions.  Thyroid appears normal.
IMPRESSION: Partial but incomplete clearing of infiltrate from lateral left
base.  There is still an area of opacity in this region.
Elsewhere, lungs are clear.

No demonstrable pulmonary embolus.

## 2013-10-22 NOTE — Telephone Encounter (Signed)
Pt states she has an appt for 11/11/2013 for removal of nexplanon, can she get it reinserted the same day. Pt informed usually we do remove and reinsert nexplanon on the same day but does depend on insurance. Informed will speak with front staff and verify coverage, pt to keep appt as scheduled if she does not hear back from our office. Pt also asking how soon can she donate blood after insertion of nexplanon, encouraged to discuss with provider at her appt. Pt verbalized understanding.

## 2013-11-11 ENCOUNTER — Encounter: Payer: Self-pay | Admitting: Advanced Practice Midwife

## 2013-11-17 ENCOUNTER — Encounter: Payer: Medicaid Other | Admitting: Advanced Practice Midwife

## 2014-03-02 ENCOUNTER — Ambulatory Visit (INDEPENDENT_AMBULATORY_CARE_PROVIDER_SITE_OTHER): Payer: Medicaid Other | Admitting: Obstetrics & Gynecology

## 2014-03-02 ENCOUNTER — Encounter: Payer: Self-pay | Admitting: Obstetrics & Gynecology

## 2014-03-02 VITALS — BP 130/90 | Ht 70.0 in | Wt 205.0 lb

## 2014-03-02 DIAGNOSIS — Z3202 Encounter for pregnancy test, result negative: Secondary | ICD-10-CM

## 2014-03-02 DIAGNOSIS — Z3046 Encounter for surveillance of implantable subdermal contraceptive: Secondary | ICD-10-CM

## 2014-03-02 LAB — POCT URINE PREGNANCY: PREG TEST UR: NEGATIVE

## 2014-03-02 NOTE — Progress Notes (Signed)
Patient ID: Denise Khan, female   DOB: 1986/06/17, 28 y.o.   MRN: [id number] Pt has had nexplanon 3 yers and 6 months  Needs removal and wants re insertion  Area prepped 1% lidocaine injected Incision made nexplanon removed without difficulty Reinserted ithout difficlty Dressed  Follow up prn

## 2014-03-02 NOTE — Addendum Note (Signed)
Addended by: Doyne Keel on: 03/02/2014 11:35 AM   Modules accepted: Orders

## 2014-10-19 ENCOUNTER — Other Ambulatory Visit (HOSPITAL_COMMUNITY): Payer: Self-pay | Admitting: Nurse Practitioner

## 2014-10-19 DIAGNOSIS — I10 Essential (primary) hypertension: Secondary | ICD-10-CM

## 2014-10-25 ENCOUNTER — Ambulatory Visit (HOSPITAL_COMMUNITY)
Admission: RE | Admit: 2014-10-25 | Discharge: 2014-10-25 | Disposition: A | Discharge status: Home / Self Care | Payer: Medicaid Other | Source: Ambulatory Visit | Attending: Nurse Practitioner | Admitting: Nurse Practitioner

## 2014-10-25 DIAGNOSIS — I10 Essential (primary) hypertension: Secondary | ICD-10-CM | POA: Diagnosis present

## 2015-04-29 ENCOUNTER — Encounter: Payer: Self-pay | Admitting: Obstetrics and Gynecology

## 2015-04-29 ENCOUNTER — Ambulatory Visit (INDEPENDENT_AMBULATORY_CARE_PROVIDER_SITE_OTHER): Payer: BC Managed Care – PPO | Admitting: Obstetrics and Gynecology

## 2015-04-29 ENCOUNTER — Other Ambulatory Visit (HOSPITAL_COMMUNITY)
Admission: RE | Admit: 2015-04-29 | Discharge: 2015-04-29 | Disposition: A | Discharge status: Home / Self Care | Payer: BC Managed Care – PPO | Source: Ambulatory Visit | Attending: Obstetrics and Gynecology | Admitting: Obstetrics and Gynecology

## 2015-04-29 VITALS — BP 120/70 | Ht 70.0 in | Wt 241.0 lb

## 2015-04-29 DIAGNOSIS — N898 Other specified noninflammatory disorders of vagina: Secondary | ICD-10-CM | POA: Diagnosis not present

## 2015-04-29 DIAGNOSIS — Z01419 Encounter for gynecological examination (general) (routine) without abnormal findings: Secondary | ICD-10-CM | POA: Diagnosis present

## 2015-04-29 DIAGNOSIS — Z1151 Encounter for screening for human papillomavirus (HPV): Secondary | ICD-10-CM | POA: Diagnosis not present

## 2015-04-29 DIAGNOSIS — Z113 Encounter for screening for infections with a predominantly sexual mode of transmission: Secondary | ICD-10-CM | POA: Diagnosis present

## 2015-04-29 LAB — POCT WET PREP WITH KOH
KOH Prep POC: NEGATIVE
TRICHOMONAS UA: POSITIVE

## 2015-04-29 MED ORDER — METRONIDAZOLE 500 MG PO TABS: 2000.0000 mg | ORAL_TABLET | Freq: Once | ORAL | Status: DC

## 2015-04-29 NOTE — Progress Notes (Signed)
Patient ID: Denise Khan, female   DOB: Sep 04, 1985, 29 y.o.   MRN: [id number]  Assessment:  Annual Gyn Exam Wet Prep Positive for Trichomonas Plan:  1. pap smear done, next pap due in 3 years. Will also test for GC/Chlamydia 2. return annually or prn 3    Annual mammogram advised 4.   Rx metronidazole, 4 tablets with refill in case partner needs treatment. 5.   Pt advised to abstain from sexual activity for at least 1 week.   Subjective:  Denise Khan is a 29 y.o. female No obstetric history on file. who presents for annual exam. Patient's last menstrual period was 04/06/2015. The patient has complaints today of vaginal discharge with associated vaginal itching. Patient denies taking any regular medications. She states her menses are normal. Patient currently has a Nexplanon implant. She reports her last pap smear was 2 years ago. She denies any bowel symptoms.  Patient denies a known history of trichomonas.   The following portions of the patient's history were reviewed and updated as appropriate: allergies, current medications, past family history, past medical history, past social history, past surgical history and problem list. Past Medical History  Diagnosis Date  . PE (pulmonary embolism)     Past Surgical History  Procedure Laterality Date  . Knee surgery       Current outpatient prescriptions:  .  busPIRone (BUSPAR) 5 MG tablet, Take 5 mg by mouth 2 (two) times daily., Disp: , Rfl:  .  Cetirizine HCl (ZYRTEC PO), Take 1 tablet by mouth daily., Disp: , Rfl:  .  traZODone (DESYREL) 100 MG tablet, Take 100 mg by mouth at bedtime., Disp: , Rfl:   Review of Systems Constitutional: negative Gastrointestinal: negative Genitourinary: vaginal discharge, vaginal itching A complete review of systems was obtained and all systems are negative except as noted in the HPI and PMH.    Objective:  BP 120/70 mmHg  Ht 5\' 10"  (1.778 m)  Wt 241 lb (109.317 kg)  BMI 34.58 kg/m2   LMP 04/06/2015   BMI: Body mass index is 34.58 kg/(m^2).  General Appearance: Alert, appropriate appearance for age. No acute distress HEENT: Grossly normal Neck / Thyroid:  Cardiovascular: RRR; normal S1, S2, no murmur Lungs: CTA bilaterally Back: No CVAT Breast Exam: No masses or nodes.No dimpling, nipple retraction or discharge. Gastrointestinal: Soft, non-tender, no masses or organomegaly Pelvic Exam: External genitalia: normal general appearance Urinary system: urethral meatus normal Vaginal: moderate white discharge Cervix: slightly irritated by discharge Adnexa: normal bimanual exam Uterus: normal single, nontender retroverted. Rectovaginal: not indicated Lymphatic Exam: Non-palpable nodes in neck, clavicular, axillary, or inguinal regions  Skin: no rash or abnormalities Neurologic: Normal gait and speech, no tremor  Psychiatric: Alert and oriented, appropriate affect.  Urinalysis:Not done   Wet Prep Positive for Trichomonas,   Mallory Shirk. MD Pgr [phone] 12:26 PM  By signing my name below, I, Stephania Fragmin, attest that this documentation has been prepared under the direction and in the presence of Jonnie Kind, MD. Electronically Signed: Stephania Fragmin, ED Scribe. 04/29/2015. 12:41 PM.  I personally performed the services described in this documentation, which was SCRIBED in my presence. The recorded information has been reviewed and considered accurate. It has been edited as necessary during review. Jonnie Kind, MD    (scribe attestation statement)

## 2015-04-29 NOTE — Addendum Note (Signed)
Addended by: Farley Ly on: 04/29/2015 12:51 PM   Modules accepted: Orders

## 2015-04-29 NOTE — Progress Notes (Signed)
Patient ID: Denise Khan, female   DOB: 08/10/1985, 29 y.o.   MRN: [id number] Pt here today for annul exam. Pt states that she has noticed Khan increase in vaginal discharge that causing itching sometimes. Pt denies any odor.

## 2015-05-04 LAB — CYTOLOGY - PAP

## 2015-09-13 ENCOUNTER — Inpatient Hospital Stay (HOSPITAL_COMMUNITY)
Admission: EM | Admit: 2015-09-13 | Discharge: 2015-09-18 | DRG: 835 | Disposition: A | Discharge status: Other Acute Inpatient Hospital | Payer: BC Managed Care – PPO | Attending: Internal Medicine | Admitting: Internal Medicine

## 2015-09-13 ENCOUNTER — Emergency Department (HOSPITAL_COMMUNITY): Payer: BC Managed Care – PPO

## 2015-09-13 ENCOUNTER — Encounter (HOSPITAL_COMMUNITY): Payer: Self-pay

## 2015-09-13 DIAGNOSIS — R509 Fever, unspecified: Secondary | ICD-10-CM | POA: Diagnosis not present

## 2015-09-13 DIAGNOSIS — R1031 Right lower quadrant pain: Secondary | ICD-10-CM | POA: Diagnosis not present

## 2015-09-13 DIAGNOSIS — C9200 Acute myeloblastic leukemia, not having achieved remission: Secondary | ICD-10-CM | POA: Diagnosis not present

## 2015-09-13 DIAGNOSIS — D72829 Elevated white blood cell count, unspecified: Secondary | ICD-10-CM | POA: Diagnosis not present

## 2015-09-13 DIAGNOSIS — N838 Other noninflammatory disorders of ovary, fallopian tube and broad ligament: Secondary | ICD-10-CM

## 2015-09-13 DIAGNOSIS — E871 Hypo-osmolality and hyponatremia: Secondary | ICD-10-CM | POA: Diagnosis present

## 2015-09-13 DIAGNOSIS — E869 Volume depletion, unspecified: Secondary | ICD-10-CM | POA: Diagnosis present

## 2015-09-13 DIAGNOSIS — R799 Abnormal finding of blood chemistry, unspecified: Secondary | ICD-10-CM

## 2015-09-13 DIAGNOSIS — R112 Nausea with vomiting, unspecified: Secondary | ICD-10-CM

## 2015-09-13 DIAGNOSIS — N83202 Unspecified ovarian cyst, left side: Secondary | ICD-10-CM | POA: Diagnosis not present

## 2015-09-13 DIAGNOSIS — D649 Anemia, unspecified: Secondary | ICD-10-CM | POA: Diagnosis not present

## 2015-09-13 DIAGNOSIS — R1032 Left lower quadrant pain: Secondary | ICD-10-CM | POA: Diagnosis not present

## 2015-09-13 DIAGNOSIS — Z87891 Personal history of nicotine dependence: Secondary | ICD-10-CM | POA: Diagnosis not present

## 2015-09-13 DIAGNOSIS — R5081 Fever presenting with conditions classified elsewhere: Secondary | ICD-10-CM | POA: Diagnosis not present

## 2015-09-13 DIAGNOSIS — Z8249 Family history of ischemic heart disease and other diseases of the circulatory system: Secondary | ICD-10-CM

## 2015-09-13 DIAGNOSIS — R350 Frequency of micturition: Secondary | ICD-10-CM | POA: Diagnosis present

## 2015-09-13 DIAGNOSIS — R1110 Vomiting, unspecified: Secondary | ICD-10-CM | POA: Insufficient documentation

## 2015-09-13 DIAGNOSIS — N730 Acute parametritis and pelvic cellulitis: Secondary | ICD-10-CM | POA: Diagnosis present

## 2015-09-13 DIAGNOSIS — R109 Unspecified abdominal pain: Secondary | ICD-10-CM | POA: Diagnosis present

## 2015-09-13 DIAGNOSIS — Z86711 Personal history of pulmonary embolism: Secondary | ICD-10-CM

## 2015-09-13 DIAGNOSIS — D696 Thrombocytopenia, unspecified: Secondary | ICD-10-CM | POA: Diagnosis not present

## 2015-09-13 DIAGNOSIS — R1115 Cyclical vomiting syndrome unrelated to migraine: Secondary | ICD-10-CM

## 2015-09-13 DIAGNOSIS — C92Z0 Other myeloid leukemia not having achieved remission: Secondary | ICD-10-CM | POA: Diagnosis not present

## 2015-09-13 LAB — CBC WITH DIFFERENTIAL/PLATELET
Basophils Absolute: 0 10*3/uL (ref 0.0–0.1)
Basophils Relative: 0 %
Eosinophils Absolute: 0 10*3/uL (ref 0.0–0.7)
Eosinophils Relative: 0 %
HCT: 32.4 % — ABNORMAL LOW (ref 36.0–46.0)
HEMOGLOBIN: 11.4 g/dL — AB (ref 12.0–15.0)
LYMPHS PCT: 57 %
Lymphs Abs: 12.6 10*3/uL — ABNORMAL HIGH (ref 0.7–4.0)
MCH: 33.3 pg (ref 26.0–34.0)
MCHC: 35.2 g/dL (ref 30.0–36.0)
MCV: 94.7 fL (ref 78.0–100.0)
MONOS PCT: 6 %
Monocytes Absolute: 1.3 10*3/uL — ABNORMAL HIGH (ref 0.1–1.0)
NEUTROS ABS: 8.1 10*3/uL — AB (ref 1.7–7.7)
NEUTROS PCT: 37 %
Platelets: 120 10*3/uL — ABNORMAL LOW (ref 150–400)
RBC: 3.42 MIL/uL — AB (ref 3.87–5.11)
RDW: 14.3 % (ref 11.5–15.5)
WBC: 22 10*3/uL — AB (ref 4.0–10.5)

## 2015-09-13 LAB — URINALYSIS, ROUTINE W REFLEX MICROSCOPIC
Bilirubin Urine: NEGATIVE
GLUCOSE, UA: NEGATIVE mg/dL
Ketones, ur: >80 mg/dL — AB
LEUKOCYTES UA: NEGATIVE
Nitrite: NEGATIVE
PH: 8 (ref 5.0–8.0)
PROTEIN: NEGATIVE mg/dL
Specific Gravity, Urine: 1.015 (ref 1.005–1.030)

## 2015-09-13 LAB — COMPREHENSIVE METABOLIC PANEL
ALBUMIN: 3.4 g/dL — AB (ref 3.5–5.0)
ALK PHOS: 46 U/L (ref 38–126)
ALT: 28 U/L (ref 14–54)
AST: 32 U/L (ref 15–41)
Anion gap: 8 (ref 5–15)
BUN: 6 mg/dL (ref 6–20)
CHLORIDE: 104 mmol/L (ref 101–111)
CO2: 22 mmol/L (ref 22–32)
CREATININE: 0.77 mg/dL (ref 0.44–1.00)
Calcium: 8.4 mg/dL — ABNORMAL LOW (ref 8.9–10.3)
GFR calc Af Amer: >60 mL/min (ref >60)
GLUCOSE: 107 mg/dL — AB (ref 65–99)
Potassium: 3.8 mmol/L (ref 3.5–5.1)
SODIUM: 134 mmol/L — AB (ref 135–145)
Total Bilirubin: 0.7 mg/dL (ref 0.3–1.2)
Total Protein: 6.8 g/dL (ref 6.5–8.1)

## 2015-09-13 LAB — WET PREP, GENITAL
SPERM: NONE SEEN
Trich, Wet Prep: NONE SEEN
YEAST WET PREP: NONE SEEN

## 2015-09-13 LAB — URINE MICROSCOPIC-ADD ON

## 2015-09-13 LAB — PREGNANCY, URINE: PREG TEST UR: NEGATIVE

## 2015-09-13 MED ORDER — ONDANSETRON HCL 4 MG/2ML IJ SOLN
4.0000 mg | Freq: Four times a day (QID) | INTRAMUSCULAR | Status: DC | PRN
Start: 2015-09-13 — End: 2015-09-18
  Administered 2015-09-14 – 2015-09-18 (×12): 4 mg via INTRAVENOUS
  Filled 2015-09-13 (×12): qty 2

## 2015-09-13 MED ORDER — PIPERACILLIN-TAZOBACTAM 3.375 G IVPB 30 MIN
3.3750 g | Freq: Once | INTRAVENOUS | Status: AC
  Administered 2015-09-14: 3.375 g via INTRAVENOUS
  Filled 2015-09-13: qty 50

## 2015-09-13 MED ORDER — HYDROMORPHONE HCL 1 MG/ML IJ SOLN
1.0000 mg | Freq: Once | INTRAMUSCULAR | Status: AC
  Administered 2015-09-13: 1 mg via INTRAVENOUS
  Filled 2015-09-13: qty 1

## 2015-09-13 MED ORDER — HYDROMORPHONE HCL 1 MG/ML IJ SOLN
1.0000 mg | INTRAMUSCULAR | Status: DC | PRN
  Administered 2015-09-14 – 2015-09-17 (×15): 1 mg via INTRAVENOUS
  Filled 2015-09-13 (×15): qty 1

## 2015-09-13 MED ORDER — SODIUM CHLORIDE 0.9 % IV SOLN
INTRAVENOUS | Status: DC
  Administered 2015-09-13: 20:00:00 via INTRAVENOUS
  Administered 2015-09-14: 1 mL via INTRAVENOUS
  Administered 2015-09-14 – 2015-09-18 (×7): via INTRAVENOUS

## 2015-09-13 MED ORDER — SODIUM CHLORIDE 0.9 % IV SOLN
Freq: Once | INTRAVENOUS | Status: AC
  Administered 2015-09-14: 09:00:00 via INTRAVENOUS
  Filled 2015-09-13: qty 1000

## 2015-09-13 MED ORDER — SENNOSIDES-DOCUSATE SODIUM 8.6-50 MG PO TABS
1.0000 | ORAL_TABLET | Freq: Every evening | ORAL | Status: DC | PRN
  Administered 2015-09-15: 1 via ORAL
  Filled 2015-09-13 (×2): qty 1

## 2015-09-13 MED ORDER — ONDANSETRON HCL 4 MG PO TABS
4.0000 mg | ORAL_TABLET | Freq: Four times a day (QID) | ORAL | Status: DC | PRN
  Administered 2015-09-15 (×2): 4 mg via ORAL
  Filled 2015-09-13 (×2): qty 1

## 2015-09-13 MED ORDER — METRONIDAZOLE IN NACL 5-0.79 MG/ML-% IV SOLN
500.0000 mg | Freq: Two times a day (BID) | INTRAVENOUS | Status: DC
  Administered 2015-09-14 – 2015-09-15 (×4): 500 mg via INTRAVENOUS
  Filled 2015-09-13 (×4): qty 100

## 2015-09-13 MED ORDER — SIMETHICONE 80 MG PO CHEW: 80.0000 mg | CHEWABLE_TABLET | Freq: Four times a day (QID) | ORAL | Status: DC | PRN

## 2015-09-13 MED ORDER — ONDANSETRON HCL 4 MG/2ML IJ SOLN
4.0000 mg | Freq: Once | INTRAMUSCULAR | Status: AC
  Administered 2015-09-13: 4 mg via INTRAVENOUS
  Filled 2015-09-13: qty 2

## 2015-09-13 NOTE — ED Notes (Signed)
Lower back pain that radiates into hips X5 days. Patient denies injury

## 2015-09-13 NOTE — ED Provider Notes (Signed)
CSN: [id number]     Arrival date & time 09/13/15  1841 History   First MD Initiated Contact with Patient 09/13/15 1924     Chief Complaint  Patient presents with  . Back Pain     (Consider location/radiation/quality/duration/timing/severity/associated sxs/prior Treatment) Patient is a 30 y.o. female presenting with frequency and abdominal pain. The history is provided by the patient.  Urinary Frequency This is a new problem. The current episode started in the past 7 days. The problem has been gradually worsening. Associated symptoms include abdominal pain, anorexia, chills, a fever, headaches, nausea and urinary symptoms. Pertinent negatives include no vomiting. She has tried acetaminophen, relaxation and heat for the symptoms.  Abdominal Pain Pain location:  LLQ, RLQ and suprapubic Pain quality: sharp and shooting   Pain radiates to:  Back Pain severity:  Severe Onset quality:  Gradual Duration:  5 days Timing:  Constant Progression:  Worsening Chronicity:  New Relieved by:  Nothing Worsened by:  Nothing tried Ineffective treatments:  Acetaminophen and NSAIDs Associated symptoms: anorexia, chills, fever and nausea   Associated symptoms: no vomiting    Denise Khan is a 30 y.o. female who presents to the ED with low back pain that radiates to the lower abdomen. The symptoms started 5 days ago and have gotten worse with fever today. She complains of pressure with urination, frequency and urgency. She is sexually active but no hx of STD.   Past Medical History  Diagnosis Date  . PE (pulmonary embolism)    Past Surgical History  Procedure Laterality Date  . Knee surgery     Family History  Problem Relation Age of Onset  . CAD Father   . Hypertension Father   . Heart disease Father   . Hypertension Mother    Social History  Substance Use Topics  . Smoking status: Former Smoker    Types: Cigarettes  . Smokeless tobacco: Never Used  . Alcohol Use: No   OB History     No data available     Review of Systems  Constitutional: Positive for fever and chills.  Gastrointestinal: Positive for nausea, abdominal pain and anorexia. Negative for vomiting.  Genitourinary: Positive for frequency.  Musculoskeletal: Positive for back pain.  Neurological: Positive for headaches.  all other systems negative    Allergies  Review of patient's allergies indicates no known allergies.  Home Medications   Prior to Admission medications   Medication Sig Start Date End Date Taking? Authorizing Provider  acetaminophen (TYLENOL) 500 MG tablet Take 1,000 mg by mouth every 6 (six) hours as needed for moderate pain.   Yes Historical Provider, MD  busPIRone (BUSPAR) 5 MG tablet Take 5 mg by mouth 2 (two) times daily.   Yes Historical Provider, MD  Cetirizine HCl (ZYRTEC PO) Take 1 tablet by mouth daily.   Yes Historical Provider, MD  etonogestrel (NEXPLANON) 68 MG IMPL implant 1 each by Subdermal route once.   Yes Historical Provider, MD  traZODone (DESYREL) 100 MG tablet Take 100 mg by mouth at bedtime as needed for sleep.    Yes Historical Provider, MD   BP 142/75 mmHg  Pulse 105  Temp(Src) 99.6 F (37.6 C) (Oral)  Resp 18  Ht 5\' 10"  (1.778 m)  Wt 99.791 kg  BMI 31.57 kg/m2  SpO2 100%  LMP 08/16/2015 Physical Exam  Constitutional: She is oriented to person, place, and time. She appears well-developed and well-nourished.  HENT:  Head: Normocephalic.  Eyes: Conjunctivae and EOM are normal.  Neck: Neck supple.  Cardiovascular: Normal rate and regular rhythm.   Pulmonary/Chest: Effort normal. She has no wheezes. She has no rales.  Abdominal: Soft. There is tenderness in the right lower quadrant, suprapubic area and left lower quadrant. There is guarding. There is no rebound.  Genitourinary:  External genitalia without lesions, frothy d/c vaginal vault. Cervix friable, positive CMT, bilateral adnexal tenderness left >right. Unable to palpate uterus due to patient  habitus.   Musculoskeletal: Normal range of motion.  Neurological: She is alert and oriented to person, place, and time. No cranial nerve deficit.  Skin: Skin is warm and dry.  Psychiatric: She has a normal mood and affect. Her behavior is normal.  Nursing note and vitals reviewed.   ED Course  Procedures (including critical care time) Labs Review Results for orders placed or performed during the hospital encounter of 09/13/15 (from the past 24 hour(s))  Urinalysis, Routine w reflex microscopic (not at [facility])     Status: Abnormal   Collection Time: 09/13/15  7:40 PM  Result Value Ref Range   Color, Urine YELLOW YELLOW   APPearance CLEAR CLEAR   Specific Gravity, Urine 1.015 1.005 - 1.030   pH 8.0 5.0 - 8.0   Glucose, UA NEGATIVE NEGATIVE mg/dL   Hgb urine dipstick TRACE (A) NEGATIVE   Bilirubin Urine NEGATIVE NEGATIVE   Ketones, ur >80 (A) NEGATIVE mg/dL   Protein, ur NEGATIVE NEGATIVE mg/dL   Nitrite NEGATIVE NEGATIVE   Leukocytes, UA NEGATIVE NEGATIVE  Pregnancy, urine     Status: None   Collection Time: 09/13/15  7:40 PM  Result Value Ref Range   Preg Test, Ur NEGATIVE NEGATIVE  Urine microscopic-add on     Status: Abnormal   Collection Time: 09/13/15  7:40 PM  Result Value Ref Range   Squamous Epithelial / LPF 6-30 (A) NONE SEEN   WBC, UA 0-5 0 - 5 WBC/hpf   RBC / HPF 0-5 0 - 5 RBC/hpf   Bacteria, UA RARE (A) NONE SEEN  CBC with Differential     Status: Abnormal   Collection Time: 09/13/15  8:10 PM  Result Value Ref Range   WBC 22.0 (H) 4.0 - 10.5 K/uL   RBC 3.42 (L) 3.87 - 5.11 MIL/uL   Hemoglobin 11.4 (L) 12.0 - 15.0 g/dL   HCT 32.4 (L) 36.0 - 46.0 %   MCV 94.7 78.0 - 100.0 fL   MCH 33.3 26.0 - 34.0 pg   MCHC 35.2 30.0 - 36.0 g/dL   RDW 14.3 11.5 - 15.5 %   Platelets 120 (L) 150 - 400 K/uL   Eosinophils Relative 0 %   Eosinophils Absolute 0.0 0.0 - 0.7 K/uL   Basophils Relative 0 %   Basophils Absolute 0.0 0.0 - 0.1 K/uL   WBC Morphology ATYPICAL LYMPHOCYTES     RBC Morphology POLYCHROMASIA PRESENT    Neutrophils Relative % 37 %   Lymphocytes Relative 57 %   Monocytes Relative 6 %   Neutro Abs 8.1 (H) 1.7 - 7.7 K/uL   Lymphs Abs 12.6 (H) 0.7 - 4.0 K/uL   Monocytes Absolute 1.3 (H) 0.1 - 1.0 K/uL  Comprehensive metabolic panel     Status: Abnormal   Collection Time: 09/13/15  8:10 PM  Result Value Ref Range   Sodium 134 (L) 135 - 145 mmol/L   Potassium 3.8 3.5 - 5.1 mmol/L   Chloride 104 101 - 111 mmol/L   CO2 22 22 - 32 mmol/L   Glucose, Bld 107 (  H) 65 - 99 mg/dL   BUN 6 6 - 20 mg/dL   Creatinine, Ser 0.77 0.44 - 1.00 mg/dL   Calcium 8.4 (L) 8.9 - 10.3 mg/dL   Total Protein 6.8 6.5 - 8.1 g/dL   Albumin 3.4 (L) 3.5 - 5.0 g/dL   AST 32 15 - 41 U/L   ALT 28 14 - 54 U/L   Alkaline Phosphatase 46 38 - 126 U/L   Total Bilirubin 0.7 0.3 - 1.2 mg/dL   GFR calc non Af Amer >60 >60 mL/min   GFR calc Af Amer >60 >60 mL/min   Anion gap 8 5 - 15  Wet prep, genital     Status: Abnormal   Collection Time: 09/13/15 11:00 PM  Result Value Ref Range   Yeast Wet Prep HPF POC NONE SEEN NONE SEEN   Trich, Wet Prep NONE SEEN NONE SEEN   Clue Cells Wet Prep HPF POC PRESENT (A) NONE SEEN   WBC, Wet Prep HPF POC MODERATE (A) NONE SEEN   Sperm NONE SEEN      Imaging Review Ct Renal Stone Study  09/13/2015  CLINICAL DATA:  Acute onset of right-sided back pain, with nausea, vomiting and hematuria. Initial encounter. EXAM: CT ABDOMEN AND PELVIS WITHOUT CONTRAST TECHNIQUE: Multidetector CT imaging of the abdomen and pelvis was performed following the standard protocol without IV contrast. COMPARISON:  Abdominal ultrasound performed 10/25/2014 FINDINGS: Minimal bibasilar scarring is noted. The liver and spleen are unremarkable in appearance. The gallbladder is within normal limits. The pancreas and adrenal glands are unremarkable. The kidneys are unremarkable in appearance. There is no evidence of hydronephrosis. No renal or ureteral stones are seen. No  perinephric stranding is appreciated. No free fluid is identified. The small bowel is unremarkable in appearance. The stomach is within normal limits. No acute vascular abnormalities are seen. The appendix is normal in caliber, without evidence of appendicitis. The colon is unremarkable in appearance. The bladder is mildly distended, with suggestion of minimal inflammation. Soft tissue inflammation is seen tracking about both sides of the uterus, with a cystic structure at the left adnexa measuring approximately 5.7 cm. Would correlate for any evidence of pelvic inflammatory disease. Pelvic ultrasound would be helpful for further evaluation, as deemed clinically appropriate. No inguinal lymphadenopathy is seen. No acute osseous abnormalities are identified. IMPRESSION: 1. Soft tissue inflammation about both sides of the uterus, with a cystic structure at the left adnexa measuring 5.7 cm. Would correlate for any evidence of pelvic inflammatory disease. Pelvic ultrasound would be helpful for further evaluation, as deemed clinically appropriate. 2. Suggestion of minimal inflammation about the bladder. Electronically Signed   By: Garald Balding M.D.   On: 09/13/2015 21:15   I have personally reviewed and evaluated these images and lab results as part of my medical decision-making.  Consult with Dr. Elonda Husky and will admit the patient to his service.  MDM   Final diagnoses:  PID (acute pelvic inflammatory disease)  Nausea and vomiting, vomiting of unspecified type       Ashley Murrain, NP 09/13/15 [place], DO 09/16/15 1755

## 2015-09-14 ENCOUNTER — Encounter (HOSPITAL_COMMUNITY): Payer: Self-pay

## 2015-09-14 ENCOUNTER — Inpatient Hospital Stay (HOSPITAL_COMMUNITY): Payer: BC Managed Care – PPO

## 2015-09-14 DIAGNOSIS — R5081 Fever presenting with conditions classified elsewhere: Secondary | ICD-10-CM

## 2015-09-14 DIAGNOSIS — R1031 Right lower quadrant pain: Secondary | ICD-10-CM

## 2015-09-14 DIAGNOSIS — N83202 Unspecified ovarian cyst, left side: Secondary | ICD-10-CM

## 2015-09-14 DIAGNOSIS — R1032 Left lower quadrant pain: Secondary | ICD-10-CM

## 2015-09-14 DIAGNOSIS — N730 Acute parametritis and pelvic cellulitis: Secondary | ICD-10-CM

## 2015-09-14 LAB — GLUCOSE, CAPILLARY: Glucose-Capillary: 149 mg/dL — ABNORMAL HIGH (ref 65–99)

## 2015-09-14 NOTE — Plan of Care (Signed)
End of shift. Reported off to Russ Halo, Therapist, sports.

## 2015-09-14 NOTE — Plan of Care (Signed)
Received report from night nurse. Assuming care of pt. [place], [place].

## 2015-09-15 DIAGNOSIS — C9200 Acute myeloblastic leukemia, not having achieved remission: Principal | ICD-10-CM

## 2015-09-15 LAB — CBC WITH DIFFERENTIAL/PLATELET
BAND NEUTROPHILS: 0 %
BASOS ABS: 0 10*3/uL (ref 0.0–0.1)
BASOS PCT: 0 %
Blasts: 4 %
EOS ABS: 0 10*3/uL (ref 0.0–0.7)
EOS PCT: 0 %
HCT: 28.8 % — ABNORMAL LOW (ref 36.0–46.0)
Hemoglobin: 10.2 g/dL — ABNORMAL LOW (ref 12.0–15.0)
LYMPHS ABS: 1.3 10*3/uL (ref 0.7–4.0)
LYMPHS PCT: 8 %
MCH: 34 pg (ref 26.0–34.0)
MCHC: 35.4 g/dL (ref 30.0–36.0)
MCV: 96 fL (ref 78.0–100.0)
MONO ABS: 7.6 10*3/uL — AB (ref 0.1–1.0)
MONOS PCT: 46 %
Metamyelocytes Relative: 0 %
Myelocytes: 0 %
NEUTROS ABS: 6.9 10*3/uL (ref 1.7–7.7)
NEUTROS PCT: 42 %
NRBC: 0 /100{WBCs}
Platelets: 109 10*3/uL — ABNORMAL LOW (ref 150–400)
Promyelocytes Absolute: 0 %
RBC: 3 MIL/uL — ABNORMAL LOW (ref 3.87–5.11)
RDW: 14.6 % (ref 11.5–15.5)
WBC: 16.5 10*3/uL — ABNORMAL HIGH (ref 4.0–10.5)

## 2015-09-15 LAB — GC/CHLAMYDIA PROBE AMP (~~LOC~~) NOT AT ARMC
Chlamydia: NEGATIVE
NEISSERIA GONORRHEA: NEGATIVE

## 2015-09-15 LAB — PATHOLOGIST SMEAR REVIEW

## 2015-09-15 MED ORDER — ACETAMINOPHEN 325 MG PO TABS
650.0000 mg | ORAL_TABLET | Freq: Four times a day (QID) | ORAL | Status: DC | PRN
  Administered 2015-09-15 – 2015-09-17 (×5): 650 mg via ORAL
  Filled 2015-09-15 (×5): qty 2

## 2015-09-15 MED ORDER — BUPIVACAINE HCL (PF) 0.5 % IJ SOLN
30.0000 mL | Freq: Once | INTRAMUSCULAR | Status: AC
  Administered 2015-09-15: 30 mL
  Filled 2015-09-15: qty 30

## 2015-09-15 MED ORDER — LORAZEPAM 1 MG PO TABS
1.0000 mg | ORAL_TABLET | Freq: Four times a day (QID) | ORAL | Status: DC | PRN
  Administered 2015-09-15 – 2015-09-17 (×3): 1 mg via ORAL
  Filled 2015-09-15 (×3): qty 1

## 2015-09-15 MED ORDER — METRONIDAZOLE 500 MG PO TABS
500.0000 mg | ORAL_TABLET | Freq: Two times a day (BID) | ORAL | Status: DC
  Administered 2015-09-15 – 2015-09-16 (×2): 500 mg via ORAL
  Filled 2015-09-15 (×2): qty 1

## 2015-09-15 MED ORDER — CIPROFLOXACIN HCL 250 MG PO TABS
500.0000 mg | ORAL_TABLET | Freq: Two times a day (BID) | ORAL | Status: DC
  Administered 2015-09-15 – 2015-09-16 (×2): 500 mg via ORAL
  Filled 2015-09-15 (×2): qty 2

## 2015-09-15 NOTE — Progress Notes (Signed)
Dr. Elonda Husky called me to present this patient and recent abnormal findings.  I have contacted Dr. Tresa Moore, Pathologist, to review case.  Dr. Tresa Moore confirms ~35% blasts on smear review, hypergranular neutrophils, and nucleated RBCs.  This was followed by a peripheral FLOW and this report is not yet signed out as MPO is still pending (will be completed tomorrow) showing 24% blasts appearing to be myeloid in nature.  She is very suspicious for AML.    Other labs are reviewed.  CBC, other than aforementioned is unimpressive with a WBC of 16.5, but she does have a normocytic, normochromic anemia (10.2 g/dL), and mild thrombocytopenia (109,000).    Given significant concern for AML, I paged Dr. Jerrye Noble at [facility] (Leukemia Service).  He agrees that this patient is appropriate for referral to him.  He is provided the patient's name and phone number on file: [phone].    Dr. Elonda Husky confirms that the patient is appropriate for discharge and therefore, she will be seen in the outpatient setting early next week at [facility]  The patient is to expect a call from [facility] tomorrow, 09/16/2015.  Patient not seen today.  30-45 minutes was spent in coordination of outpatient care.  Doy Mince  09/15/2015 4:14 PM Pager: [phone]

## 2015-09-16 DIAGNOSIS — C9200 Acute myeloblastic leukemia, not having achieved remission: Secondary | ICD-10-CM | POA: Diagnosis present

## 2015-09-16 LAB — RETICULOCYTES
RBC.: 3.56 MIL/uL — ABNORMAL LOW (ref 3.87–5.11)
RETIC COUNT ABSOLUTE: 39.2 10*3/uL (ref 19.0–186.0)
RETIC CT PCT: 1.1 % (ref 0.4–3.1)

## 2015-09-16 LAB — MONONUCLEOSIS SCREEN: Mono Screen: NEGATIVE

## 2015-09-16 LAB — LACTATE DEHYDROGENASE: LDH: 432 U/L — ABNORMAL HIGH (ref 98–192)

## 2015-09-16 MED ORDER — METRONIDAZOLE IN NACL 5-0.79 MG/ML-% IV SOLN
500.0000 mg | Freq: Three times a day (TID) | INTRAVENOUS | Status: DC
  Administered 2015-09-16 – 2015-09-18 (×6): 500 mg via INTRAVENOUS
  Filled 2015-09-16 (×6): qty 100

## 2015-09-16 MED ORDER — PIPERACILLIN-TAZOBACTAM 3.375 G IVPB
3.3750 g | Freq: Three times a day (TID) | INTRAVENOUS | Status: DC
  Administered 2015-09-16 – 2015-09-18 (×6): 3.375 g via INTRAVENOUS
  Filled 2015-09-16 (×6): qty 50

## 2015-09-16 MED ORDER — BISACODYL 5 MG PO TBEC
5.0000 mg | DELAYED_RELEASE_TABLET | Freq: Every day | ORAL | Status: DC | PRN
  Administered 2015-09-17: 5 mg via ORAL
  Filled 2015-09-16: qty 1

## 2015-09-16 NOTE — H&P (Signed)
History and Physical  Denise Khan is a 30 y.o. . with Patient's last menstrual period was 08/16/2015. admitted for a pPatient is a 30 y.o. female presenting with frequency and abdominal pain. The history is provided by the patient.  Urinary Frequency This is a new problem. The current episode started in the past 7 days. The problem has been gradually worsening. Associated symptoms include abdominal pain, anorexia, chills, a fever, headaches, nausea and urinary symptoms. Pertinent negatives include no vomiting. She has tried acetaminophen, relaxation and heat for the symptoms.  Abdominal Pain Pain location: LLQ, RLQ and suprapubic Pain quality: sharp and shooting  Pain radiates to: Back Pain severity: Severe Onset quality: Gradual Duration: 5 days Timing: Constant Progression: Worsening Chronicity: New Relieved by: Nothing Worsened by: Nothing tried Ineffective treatments: Acetaminophen and NSAIDs Associated symptoms: anorexia, chills, fever and nausea  Associated symptoms: no vomiting   Denise Khan is a 30 y.o. female who presents to the ED with low back pain that radiates to the lower abdomen. The symptoms started 5 days ago and have gotten worse with fever today. She complains of pressure with urination, frequency and urgency. She is sexually active but no hx of STD. elvic infection vs TOA.    PMH:    Past Medical History  Diagnosis Date  . PE (pulmonary embolism)     PSH:     Past Surgical History  Procedure Laterality Date  . Knee surgery      POb/GynH:        SH:   Social History  Substance Use Topics  . Smoking status: Former Smoker    Types: Cigarettes  . Smokeless tobacco: Never Used  . Alcohol Use: No    FH:    Family History  Problem Relation Age of Onset  . CAD Father   . Hypertension Father   . Heart disease Father   . Hypertension Mother      Allergies: No Known Allergies  Medications:       Current  facility-administered medications:  .  0.9 %  sodium chloride infusion, , Intravenous, Continuous, Ashley Murrain, NP, Last Rate: 125 mL/hr at 09/16/15 1047 .  acetaminophen (TYLENOL) tablet 650 mg, 650 mg, Oral, Q6H PRN, Florian Buff, MD, 650 mg at 09/16/15 1221 .  ciprofloxacin (CIPRO) tablet 500 mg, 500 mg, Oral, BID, Florian Buff, MD, 500 mg at 09/16/15 0827 .  HYDROmorphone (DILAUDID) injection 1 mg, 1 mg, Intravenous, Q4H PRN, Ashley Murrain, NP, 1 mg at 09/16/15 1045 .  LORazepam (ATIVAN) tablet 1 mg, 1 mg, Oral, Q6H PRN, Florian Buff, MD, 1 mg at 09/15/15 2211 .  metroNIDAZOLE (FLAGYL) tablet 500 mg, 500 mg, Oral, BID, Florian Buff, MD, 500 mg at 09/16/15 1212 .  ondansetron (ZOFRAN) tablet 4 mg, 4 mg, Oral, Q6H PRN, 4 mg at 09/15/15 0640 **OR** ondansetron (ZOFRAN) injection 4 mg, 4 mg, Intravenous, Q6H PRN, Ashley Murrain, NP, 4 mg at 09/16/15 1003 .  senna-docusate (Senokot-S) tablet 1 tablet, 1 tablet, Oral, QHS PRN, Ashley Murrain, NP, 1 tablet at 09/15/15 1032 .  simethicone (MYLICON) chewable tablet 80 mg, 80 mg, Oral, QID PRN, Ashley Murrain, NP  Review of Systems:   Review of Systems  Constitutional: Negative for fever, chills, weight loss, malaise/fatigue and diaphoresis.  HENT: Negative for hearing loss, ear pain, nosebleeds, congestion, sore throat, neck pain, tinnitus and ear discharge.   Eyes: Negative for blurred vision, double vision, photophobia, pain, discharge  and redness.  Respiratory: Negative for cough, hemoptysis, sputum production, shortness of breath, wheezing and stridor.   Cardiovascular: Negative for chest pain, palpitations, orthopnea, claudication, leg swelling and PND.  Gastrointestinal: Positive for abdominal pain. Negative for heartburn, nausea, vomiting, diarrhea, constipation, blood in stool and melena.  Genitourinary: Negative for dysuria, urgency, frequency, hematuria and flank pain.  Musculoskeletal: Negative for myalgias, back pain, joint pain and falls.   Skin: Negative for itching and rash.  Neurological: Negative for dizziness, tingling, tremors, sensory change, speech change, focal weakness, seizures, loss of consciousness, weakness and headaches.  Endo/Heme/Allergies: Negative for environmental allergies and polydipsia. Does not bruise/bleed easily.  Psychiatric/Behavioral: Negative for depression, suicidal ideas, hallucinations, memory loss and substance abuse. The patient is not nervous/anxious and does not have insomnia.      PHYSICAL EXAM:  . BP 142/75 mmHg  Pulse 105  Temp(Src) 99.6 F (37.6 C) (Oral)  Resp 18  Ht 5\' 10"  (1.778 m)  Wt 99.791 kg  BMI 31.57 kg/m2  SpO2 100%  LMP 08/16/2015 Physical Exam  Constitutional: She is oriented to person, place, and time. She appears well-developed and well-nourished.  HENT:  Head: Normocephalic.  Eyes: Conjunctivae and EOM are normal.  Neck: Neck supple.  Cardiovascular: Normal rate and regular rhythm.  Pulmonary/Chest: Effort normal. She has no wheezes. She has no rales.  Abdominal: Soft. There is tenderness in the right lower quadrant, suprapubic area and left lower quadrant. There is guarding. There is no rebound.  Genitourinary:  External genitalia without lesions, frothy d/c vaginal vault. Cervix friable, positive CMT, bilateral adnexal tenderness left >right. Unable to palpate uterus due to patient habitus.  Musculoskeletal: Normal range of motion.  Neurological: She is alert and oriented to person, place, and time. No cranial nerve deficit.  Skin: Skin is warm and dry.  Psychiatric: She has a normal mood and affect. Her behavior is normal.  Nursing note and vitals reviewed.   Labs:   EKG:   Imaging Studies: US Transvaginal Non-ob  October 01, 2015  CLINICAL DATA:  Cystic abnormality noted on CT EXAM: TRANSABDOMINAL AND TRANSVAGINAL ULTRASOUND OF PELVIS DOPPLER ULTRASOUND OF OVARIES TECHNIQUE: Both transabdominal and transvaginal ultrasound examinations of the pelvis  were performed. Transabdominal technique was performed for global imaging of the pelvis including uterus, ovaries, adnexal regions, and pelvic cul-de-sac. It was necessary to proceed with endovaginal exam following the transabdominal exam to visualize the endometrium and adnexa. Color and duplex Doppler ultrasound was utilized to evaluate blood flow to the ovaries. COMPARISON:  Yesterday FINDINGS: Uterus Measurements: 9.8 x 4.6 x 6.6 cm. Multiple fibroids are identified. Posterior fundal fibroid measures 1.5 x 1.6 x 1.2 cm. Second posterior fundal fibroid measures 1.9 x 1.6 x 0.9 cm. Anterior lower fibroid measures 1.1 x 1.3 x 0.7 cm. Endometrium Thickness: 5 mm.  No focal abnormality visualized. Right ovary Measurements: 3.4 x 1.6 x 3.0 cm. No focal abnormality. Left ovary Measurements: 6.4 x 2.3 x 4.8 cm. There are 2 simple cysts in the left ovary. One measures 3.7 cm and the other measures 3.4 cm Pulsed Doppler evaluation of both ovaries demonstrates normal low-resistance arterial and venous waveforms. Other findings Trace free-fluid. IMPRESSION: No evidence of ovarian torsion. Multiple uterine fibroids as described above. Trace free fluid is likely physiologic. The CT abnormality corresponds to benign appearing cysts in the left ovary. The larger one measures 3.7 cm and does not require follow-up in a premenopausal patient. Electronically Signed   By: Marybelle Killings M.D.   On: Oct 01, 2015 10:44  US Pelvis Complete  09/14/2015  CLINICAL DATA:  Cystic abnormality noted on CT EXAM: TRANSABDOMINAL AND TRANSVAGINAL ULTRASOUND OF PELVIS DOPPLER ULTRASOUND OF OVARIES TECHNIQUE: Both transabdominal and transvaginal ultrasound examinations of the pelvis were performed. Transabdominal technique was performed for global imaging of the pelvis including uterus, ovaries, adnexal regions, and pelvic cul-de-sac. It was necessary to proceed with endovaginal exam following the transabdominal exam to visualize the endometrium and  adnexa. Color and duplex Doppler ultrasound was utilized to evaluate blood flow to the ovaries. COMPARISON:  Yesterday FINDINGS: Uterus Measurements: 9.8 x 4.6 x 6.6 cm. Multiple fibroids are identified. Posterior fundal fibroid measures 1.5 x 1.6 x 1.2 cm. Second posterior fundal fibroid measures 1.9 x 1.6 x 0.9 cm. Anterior lower fibroid measures 1.1 x 1.3 x 0.7 cm. Endometrium Thickness: 5 mm.  No focal abnormality visualized. Right ovary Measurements: 3.4 x 1.6 x 3.0 cm. No focal abnormality. Left ovary Measurements: 6.4 x 2.3 x 4.8 cm. There are 2 simple cysts in the left ovary. One measures 3.7 cm and the other measures 3.4 cm Pulsed Doppler evaluation of both ovaries demonstrates normal low-resistance arterial and venous waveforms. Other findings Trace free-fluid. IMPRESSION: No evidence of ovarian torsion. Multiple uterine fibroids as described above. Trace free fluid is likely physiologic. The CT abnormality corresponds to benign appearing cysts in the left ovary. The larger one measures 3.7 cm and does not require follow-up in a premenopausal patient. Electronically Signed   By: Marybelle Killings M.D.   On: 09/14/2015 10:44   [place] Art/ven Flow Abd Pelv Doppler  09/14/2015  CLINICAL DATA:  Cystic abnormality noted on CT EXAM: TRANSABDOMINAL AND TRANSVAGINAL ULTRASOUND OF PELVIS DOPPLER ULTRASOUND OF OVARIES TECHNIQUE: Both transabdominal and transvaginal ultrasound examinations of the pelvis were performed. Transabdominal technique was performed for global imaging of the pelvis including uterus, ovaries, adnexal regions, and pelvic cul-de-sac. It was necessary to proceed with endovaginal exam following the transabdominal exam to visualize the endometrium and adnexa. Color and duplex Doppler ultrasound was utilized to evaluate blood flow to the ovaries. COMPARISON:  Yesterday FINDINGS: Uterus Measurements: 9.8 x 4.6 x 6.6 cm. Multiple fibroids are identified. Posterior fundal fibroid measures 1.5 x 1.6 x 1.2 cm.  Second posterior fundal fibroid measures 1.9 x 1.6 x 0.9 cm. Anterior lower fibroid measures 1.1 x 1.3 x 0.7 cm. Endometrium Thickness: 5 mm.  No focal abnormality visualized. Right ovary Measurements: 3.4 x 1.6 x 3.0 cm. No focal abnormality. Left ovary Measurements: 6.4 x 2.3 x 4.8 cm. There are 2 simple cysts in the left ovary. One measures 3.7 cm and the other measures 3.4 cm Pulsed Doppler evaluation of both ovaries demonstrates normal low-resistance arterial and venous waveforms. Other findings Trace free-fluid. IMPRESSION: No evidence of ovarian torsion. Multiple uterine fibroids as described above. Trace free fluid is likely physiologic. The CT abnormality corresponds to benign appearing cysts in the left ovary. The larger one measures 3.7 cm and does not require follow-up in a premenopausal patient. Electronically Signed   By: Marybelle Killings M.D.   On: 09/14/2015 10:44   Ct Renal Stone Study  09/13/2015  CLINICAL DATA:  Acute onset of right-sided back pain, with nausea, vomiting and hematuria. Initial encounter. EXAM: CT ABDOMEN AND PELVIS WITHOUT CONTRAST TECHNIQUE: Multidetector CT imaging of the abdomen and pelvis was performed following the standard protocol without IV contrast. COMPARISON:  Abdominal ultrasound performed 10/25/2014 FINDINGS: Minimal bibasilar scarring is noted. The liver and spleen are unremarkable in appearance. The gallbladder is within normal  limits. The pancreas and adrenal glands are unremarkable. The kidneys are unremarkable in appearance. There is no evidence of hydronephrosis. No renal or ureteral stones are seen. No perinephric stranding is appreciated. No free fluid is identified. The small bowel is unremarkable in appearance. The stomach is within normal limits. No acute vascular abnormalities are seen. The appendix is normal in caliber, without evidence of appendicitis. The colon is unremarkable in appearance. The bladder is mildly distended, with suggestion of minimal  inflammation. Soft tissue inflammation is seen tracking about both sides of the uterus, with a cystic structure at the left adnexa measuring approximately 5.7 cm. Would correlate for any evidence of pelvic inflammatory disease. Pelvic ultrasound would be helpful for further evaluation, as deemed clinically appropriate. No inguinal lymphadenopathy is seen. No acute osseous abnormalities are identified. IMPRESSION: 1. Soft tissue inflammation about both sides of the uterus, with a cystic structure at the left adnexa measuring 5.7 cm. Would correlate for any evidence of pelvic inflammatory disease. Pelvic ultrasound would be helpful for further evaluation, as deemed clinically appropriate. 2. Suggestion of minimal inflammation about the bladder. Electronically Signed   By: Garald Balding M.D.   On: 09/13/2015 21:15      Assessment: Probable PID Left ovarian cyst, simple, does not appear to be TOA process based on sonographic appearance, no torsion Elevated WBC with significant number of lymphocytes with atypia,? Viral etiology  Plan: Due to high WBC admitted and placed on TOA specific antibiotics, Zosyn and Flagyl.  Dwon to 16 from 22K at the time of presentation.  Gc Chlamydia pending but likely to be negative.  WBC differential to be further evaluated by a pathologist performed manual differential, pending.    Exam is better than presentation  EURE,LUTHER H

## 2015-09-16 NOTE — Progress Notes (Signed)
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  I told them that I had contacted Kirby Crigler of the [facility] and he had contacted Dr Jerrye Noble at [facility] regarding the diagnosis.  He was given her contact information and will schedule an appointment to see her next week to undergo a bone marrow biopsy for more thorough evaluation and to better target her therapy to her subtype of leukemia.  I answered all the questions I could and they are aware I have pretty limited specific answers to their more specific question.  The patient was understandably upset and as a result I will keep her in house tonight and switch to oral antibiotics to evaluate her response.  Now i am wondering if her pain and fever are even PID or somehow related to her AML, the fever I can see but I am unsure about the abdominal pain being related.  The ovarian cyst is small and should not be the source of her pain.     Face to face time:  25 minutes  Greater than 50% of the visit time was spent in counseling and coordination of care with the patient.  The summary and outline of the counseling and care coordination is summarized in the note above.   All questions were answered.

## 2015-09-16 NOTE — Consult Note (Signed)
Consult Note  Denise Khan YQM:[id number] DOB: 1986/01/16 DOA: 09/13/2015  Referring physician: Dr Darlis Loan PCP: Denise Edwards, NP   Chief Complaint: Abdominal pain  HPI: Denise Khan is a 30 y.o. female  Pulmonary embolism, not on anticoagulation. The patient's presents to the hospital with 1 week of increasing lower abdominal pain, nausea and vomiting. The pain has been increasing over the past several days prior to admission. She presented to the emergency department on 3/15 with chills, nausea and vomiting. CT scan of her abdomen showed a questionable fluid collection on her left adnexa, as well as stranding around the uterus suggestive of PID/tubo-ovarian abscess. The patient was admitted on the OB/GYN service and was started on Zosyn and Flagyl. Ultrasound done the next day revealed 2 simple cysts on her left ovary, both of which were approximately 3.5 cm no evidence of torsion or abscess. The patient's pain had improved on hospital day 1, and was prepared to be discharged the subsequent day, however the patient's pain had increased after being switched to oral ciprofloxacin and Flagyl. Additionally, the patient spiked a fever to 101 is afternoon.   Noted on laboratory data on admission included an elevated lymphocytes. Flow cytometry was done showing 24% blasts appearing to be myeloid in nature. The official report is not completed, however the pathologist reported verbally to Robynn Pane, PA-C of oncology that the MPO was negative.   Review of Systems:   Pt denies any diarrhea, shortness of breath, dyspnea on exertion, orthopnea, cough, wheezing, palpitations, headache, vision changes, lightheadedness, dizziness, diarrhea, constipation, melena, rectal bleeding.  Review of systems are otherwise negative  Past Medical History  Diagnosis Date  . PE (pulmonary embolism)    Past Surgical History  Procedure Laterality Date  . Knee surgery     Social History:  reports that  she has quit smoking. Her smoking use included Cigarettes. She has never used smokeless tobacco. She reports that she does not drink alcohol or use illicit drugs. Patient lives home and is able to participate in activities of daily living  No Known Allergies  Family History  Problem Relation Age of Onset  . CAD Father   . Hypertension Father   . Heart disease Father   . Hypertension Mother      Prior to Admission medications   Medication Sig Start Date End Date Taking? Authorizing Provider  acetaminophen (TYLENOL) 500 MG tablet Take 1,000 mg by mouth every 6 (six) hours as needed for moderate pain.   Yes Historical Provider, MD  busPIRone (BUSPAR) 5 MG tablet Take 5 mg by mouth 2 (two) times daily.   Yes Historical Provider, MD  Cetirizine HCl (ZYRTEC PO) Take 1 tablet by mouth daily.   Yes Historical Provider, MD  etonogestrel (NEXPLANON) 68 MG IMPL implant 1 each by Subdermal route once.   Yes Historical Provider, MD  traZODone (DESYREL) 100 MG tablet Take 100 mg by mouth at bedtime as needed for sleep.    Yes Historical Provider, MD    Physical Exam: BP 138/78 mmHg  Pulse 107  Temp(Src) 100.4 F (38 C) (Oral)  Resp 20  Ht '5\' 10"'  (1.778 m)  Wt 105.507 kg (232 lb 9.6 oz)  BMI 33.37 kg/m2  SpO2 100%  LMP 08/16/2015  General: young black femaleAwake and alert and oriented x3. No acute cardiopulmonary distress.  Eyes: Pupils equal, round, reactive to light. Extraocular muscles are intact. Sclerae anicteric and noninjected.  ENT:  Moist mucosal membranes. No mucosal lesions.  Neck: Neck supple without lymphadenopathy. No carotid bruits. No masses palpated.  Cardiovascular: Regular rate with normal S1-S2 sounds. No murmurs, rubs, gallops auscultated. No JVD.  Respiratory: Good respiratory effort with no wheezes, rales, rhonchi. Lungs clear to auscultation bilaterally.  Abdomen:  acutely tender to palpation in the lower quadrants bilaterally, left greater than right. Soft,  nondistended. Active bowel sounds. No masses or hepatosplenomegaly  Skin: Dry, warm to touch. 2+ dorsalis pedis and radial pulses. Musculoskeletal: No calf or leg pain. All major joints not erythematous nontender.  Psychiatric: Intact judgment and insight.  Neurologic: No focal neurological deficits. Cranial nerves II through XII are grossly intact.           Labs on Admission:  Basic Metabolic Panel:  Recent Labs Lab 09/13/15 2010  NA 134*  K 3.8  CL 104  CO2 22  GLUCOSE 107*  BUN 6  CREATININE 0.77  CALCIUM 8.4*   Liver Function Tests:  Recent Labs Lab 09/13/15 2010  AST 32  ALT 28  ALKPHOS 46  BILITOT 0.7  PROT 6.8  ALBUMIN 3.4*   No results for input(s): LIPASE, AMYLASE in the last 168 hours. No results for input(s): AMMONIA in the last 168 hours. CBC:  Recent Labs Lab 09/13/15 2010 09/15/15 0424  WBC 22.0* 16.5*  NEUTROABS 8.1* 6.9  HGB 11.4* 10.2*  HCT 32.4* 28.8*  MCV 94.7 96.0  PLT 120* 109*   Cardiac Enzymes: No results for input(s): CKTOTAL, CKMB, CKMBINDEX, TROPONINI in the last 168 hours.  BNP (last 3 results) No results for input(s): BNP in the last 8760 hours.  ProBNP (last 3 results) No results for input(s): PROBNP in the last 8760 hours.  CBG:  Recent Labs Lab 09/14/15 2053  GLUCAP 149*    Radiological Exams on Admission: No results found.   Assessment/Plan Present on Admission:  . PID (acute pelvic inflammatory disease) . 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[place])  This patient was discussed with the ED physician, including pertinent vitals, physical exam findings, labs, and imaging.  We also discussed care given by the ED provider.   #1 PID  I discussed this patient extensively with Dr. Elonda Husky to GYN.  Will assume care of the patient given possible AML  As the patient's abdominal pain is worse, as well as fever, will increase antibiotic coverage back to Zosyn and Flagyl. We'll continue this until she is 48 hours  afebrile. Will discharge on doxycycline and Flagyl for 1 week to [facility]  #2 possible AML    we were asked to assume primary care of the patient   I discussed this patient with Robynn Pane, PA-C  He has been in order for bone marrow biopsy to be performed by interventional radiology, hopefully on Monday, as this will confirm diagnosis of AML. This will need to be done prior to the patient being sent to [facility].    Will obtain Monospot  We'll obtain anemia panel with LDH  DVT prophylaxis: SCD  Consultants: None  Code Status: Full code  Family Communication: family in room    Truett Mainland, DO [facility] Pager [phone]

## 2015-09-16 NOTE — Care Management Note (Signed)
Case Management Note  Patient Details  Name: MATTY MONTOTO MRN: [id number] Date of Birth: 1986-02-10  Subjective/Objective:                  Pt admitted with PID. Pt is from home and is ind with ADL's. Pt has PCP, insurance, transportation and no problems obtaining medications. Pt plans to return home with self care today. Pt will need to f/u with [facility] oncology. Pt understands and [facility] oncology has made appointment next week.   Action/Plan: Pt has no CM needs. Discharging home today.  Expected Discharge Date:    09/16/2015              Expected Discharge Plan:  Home/Self Care  In-House Referral:  NA  Discharge planning Services  CM Consult  Post Acute Care Choice:  NA Choice offered to:  NA  DME Arranged:    DME Agency:     HH Arranged:    HH Agency:     Status of Service:  Completed, signed off  Medicare Important Message Given:    Date Medicare IM Given:    Medicare IM give by:    Date Additional Medicare IM Given:    Additional Medicare Important Message give by:     If discussed at [place] of Stay Meetings, dates discussed:    Additional Comments:  Sherald Barge, RN 09/16/2015, 12:01 PM

## 2015-09-16 NOTE — Progress Notes (Signed)
  PID Left ovarian cyst Possible myeloid Leukemia  Subjective: Patient reports nausea.  Severe headache, pt has been suffering with for some time, exam with multiple cerivical trapezius triggers with occipital insertion tenderness  Multiple injections performed 0.5% marcaine 20 cc total with massage and pressure point therapy of the occiput Headache went from scale 9 to 4 just after  Abdominal pain improved pt states    Objective: I have reviewed patient's vital signs, intake and output, medications, labs, microbiology and radiology results.  General: alert, cooperative and mild distress GI: soft mild tenderness LLQ no rebound RLQ benign Neck with triggers resolved with injection therapy  Assessment/Plan: ?PID Left ovarian cyst Possible AML  I talked at length with Dr Tammi Klippel regarding peripheral smear which revealed 35% or so of immature forms/blasts I approved going ahead with flow cytometry I did not inform pt at this point of the diagnosis, will await the flow cytometry results i did tell pt and her family that the WBC differential was unusual and was being investigated  Continue zosyn and flagyl for now  LOS: 3 days    Evamaria Detore H 09/16/2015, 1:34 PM

## 2015-09-16 NOTE — Progress Notes (Signed)
AML ?PID Left ovarian cys, 3.7 cm, simple   Subjective:  Filed Vitals:   09/15/15 2016 09/16/15 0200 09/16/15 1103 09/16/15 1200  BP: 157/74 134/72 136/74 138/78  Pulse: 90 101 91 107  Temp: 99.9 F (37.7 C) 98.1 F (36.7 C) 101 F (38.3 C) 100.4 F (38 C)  TempSrc: Oral Oral Oral Oral  Resp: 19 18 20 20   Height:      Weight:      SpO2: 100% 98% 99% 100%    Patient reports increased pain, fever, nausea Fells worse today than last night Family communicates does not feel comfortable going home with this owrsening and her AML diagnosis  Specifically they ask can they go to [facility] now and start their evaluation and management I told family i was unsure and would inquire about our options  Objective: I have reviewed patient's vital signs, intake and output, medications, labs, microbiology, pathology and radiology results.  Gen WDWN female mild distress Abdomen soft tender LLQ no reblound  No new labs     Assessment/Plan AML ?PID Left ovarian cyst 3.7 cm  Will contact Kirby Crigler and [facility] and see if we can transfer or what the best management for this pt is at the present   LOS: 3 days    Jarred Purtee H 09/16/2015, 1:53 PM

## 2015-09-17 DIAGNOSIS — R509 Fever, unspecified: Secondary | ICD-10-CM

## 2015-09-17 LAB — FERRITIN: FERRITIN: 791 ng/mL — AB (ref 11–307)

## 2015-09-17 LAB — IRON AND TIBC
IRON: 46 ug/dL (ref 28–170)
SATURATION RATIOS: 20 % (ref 10.4–31.8)
TIBC: 231 ug/dL — ABNORMAL LOW (ref 250–450)
UIBC: 185 ug/dL

## 2015-09-17 LAB — CBC
HEMATOCRIT: 30.4 % — AB (ref 36.0–46.0)
HEMOGLOBIN: 10.7 g/dL — AB (ref 12.0–15.0)
MCH: 33.2 pg (ref 26.0–34.0)
MCHC: 35.2 g/dL (ref 30.0–36.0)
MCV: 94.4 fL (ref 78.0–100.0)
Platelets: 97 10*3/uL — ABNORMAL LOW (ref 150–400)
RBC: 3.22 MIL/uL — ABNORMAL LOW (ref 3.87–5.11)
RDW: 14.4 % (ref 11.5–15.5)
WBC: 26.7 10*3/uL — AB (ref 4.0–10.5)

## 2015-09-17 LAB — FOLATE: Folate: 11.9 ng/mL (ref >5.9)

## 2015-09-17 LAB — VITAMIN B12: Vitamin B-12: 711 pg/mL (ref 180–914)

## 2015-09-17 MED ORDER — ACETAMINOPHEN 500 MG PO TABS: 1000.0000 mg | ORAL_TABLET | Freq: Four times a day (QID) | ORAL | Status: DC | PRN

## 2015-09-17 MED ORDER — TRAZODONE HCL 50 MG PO TABS: 100.0000 mg | ORAL_TABLET | Freq: Every evening | ORAL | Status: DC | PRN

## 2015-09-17 MED ORDER — HYDROMORPHONE HCL 1 MG/ML IJ SOLN
0.5000 mg | INTRAMUSCULAR | Status: DC | PRN
  Administered 2015-09-17 – 2015-09-18 (×5): 0.5 mg via INTRAVENOUS
  Filled 2015-09-17 (×5): qty 1

## 2015-09-17 MED ORDER — BUSPIRONE HCL 5 MG PO TABS
5.0000 mg | ORAL_TABLET | Freq: Two times a day (BID) | ORAL | Status: DC
  Administered 2015-09-17 – 2015-09-18 (×2): 5 mg via ORAL
  Filled 2015-09-17 (×3): qty 1

## 2015-09-17 MED ORDER — OXYCODONE HCL 5 MG PO TABS
5.0000 mg | ORAL_TABLET | ORAL | Status: DC | PRN
  Administered 2015-09-17 – 2015-09-18 (×3): 5 mg via ORAL
  Filled 2015-09-17 (×4): qty 1

## 2015-09-17 NOTE — Progress Notes (Signed)
Discussed case wit Dr. Whitney Muse  Plan for BM biopsy at [facility] on Monday. Pt to return to [facility] per Dr. Whitney Muse  NPO p MN Sunday night.  Will need [facility] arranged for Monday.  Tishana Clinkenbeard S Lonnell Chaput PA-C 09/17/2015 11:08 AM

## 2015-09-17 NOTE — Progress Notes (Signed)
[facility] PROGRESS NOTE    Progress Note   Denise Khan TMA:[id number] DOB: 1985/09/23 DOA: 09/13/2015 PCP: Yevette Edwards, NP   Brief Narrative:   Denise Khan is an 30 y.o. female come [place] PID, found to have an elevated white count which did not respond to empiric antibiotics, peripheral smear showing increase in mononuclear cells she is scheduled for a biopsy by interventional radiologist on 09/19/2015.  Assessment/Plan:   PID (acute pelvic inflammatory disease): I agree with continuing empiric antibiotics Zosyn and Flagyl, for the next 48 hours. Be sure she remains afebrile from this standpoint. She did then be switched to Doxy and Flagyl for a week as an outpatient.  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[place]): Interventional radiology to perform bone marrow biopsy on Monday. Anemia panel  Pending and LDH is 432. Monospot is negative. Transfer to [facility].    DVT Prophylaxis - Lovenox ordered.  Family Communication: none Disposition Plan: Transfer to [facility] long Code Status:     Code Status Orders        Start     Ordered   09/13/15 2315  Full code   Continuous     09/13/15 2322    Code Status History    Date Active Date Inactive Code Status Order ID Comments User Context   01/07/2013  5:32 PM 01/08/2013  5:37 PM Full Code [id number]  Samuella Cota, MD Inpatient        IV Access:    Peripheral IV   Procedures and diagnostic studies:   No results found.   Medical Consultants:    None.  Anti-Infectives:   Anti-infectives    Start     Dose/Rate Route Frequency Ordered Stop   09/16/15 2000  metroNIDAZOLE (FLAGYL) IVPB 500 mg     500 mg 100 mL/hr over 60 Minutes Intravenous Every 8 hours 09/16/15 1822     09/16/15 1830  piperacillin-tazobactam (ZOSYN) IVPB 3.375 g     3.375 g 12.5 mL/hr over 240 Minutes Intravenous Every 8 hours 09/16/15 1822     09/15/15 2200  metroNIDAZOLE (FLAGYL) tablet 500 mg  Status:  Discontinued     500 mg Oral 2 times daily 09/15/15 1709 09/16/15 1822   09/15/15 2000  ciprofloxacin (CIPRO) tablet 500 mg  Status:  Discontinued     500 mg Oral 2 times daily 09/15/15 1709 09/16/15 1822   09/13/15 2330  metroNIDAZOLE (FLAGYL) IVPB 500 mg  Status:  Discontinued     500 mg 100 mL/hr over 60 Minutes Intravenous Every 12 hours 09/13/15 2322 09/15/15 1712   09/13/15 2330  piperacillin-tazobactam (ZOSYN) IVPB 3.375 g     3.375 g 100 mL/hr over 30 Minutes Intravenous  Once 09/13/15 2322 09/14/15 0106      Subjective:    Denise Khan pain controlled.  Objective:    Filed Vitals:   09/16/15 2232 09/17/15 0200 09/17/15 0524 09/17/15 0630  BP: 125/68 153/86  149/82  Pulse: 100 108  112  Temp: 100.5 F (38.1 C) 100.2 F (37.9 C) 101.5 F (38.6 C) 99.5 F (37.5 C)  TempSrc: Oral Oral Oral Oral  Resp: '20 20  20  ' Height:      Weight:      SpO2: 97% 96%  98%    Intake/Output Summary (Last 24 hours) at 09/17/15 0827 Last data filed at 09/17/15 0521  Gross per 24 hour  Intake   1600 ml  Output      0 ml  Net  1600 ml   Filed Weights   09/13/15 1915 09/14/15 0130  Weight: 99.791 kg (220 lb) 105.507 kg (232 lb 9.6 oz)    Exam: Gen:  NAD Cardiovascular:  RRR. Chest and lungs:   CTAB Abdomen:  Abdomen soft, NT/ND, + BS Extremities:  No edema   Data Reviewed:    Labs: Basic Metabolic Panel:  Recent Labs Lab 09/13/15 2010  NA 134*  K 3.8  CL 104  CO2 22  GLUCOSE 107*  BUN 6  CREATININE 0.77  CALCIUM 8.4*   GFR Estimated Creatinine Clearance: 135.2 mL/min (by C-G formula based on Cr of 0.77). Liver Function Tests:  Recent Labs Lab 09/13/15 2010  AST 32  ALT 28  ALKPHOS 46  BILITOT 0.7  PROT 6.8  ALBUMIN 3.4*   No results for input(s): LIPASE, AMYLASE in the last 168 hours. No results for input(s): AMMONIA in the last 168 hours. Coagulation profile No results for input(s): INR, PROTIME in the last 168 hours.  CBC:  Recent Labs Lab  09/13/15 2010 09/15/15 0424 09/17/15 0550  WBC 22.0* 16.5* 26.7*  NEUTROABS 8.1* 6.9  --   HGB 11.4* 10.2* 10.7*  HCT 32.4* 28.8* 30.4*  MCV 94.7 96.0 94.4  PLT 120* 109* 97*   Cardiac Enzymes: No results for input(s): CKTOTAL, CKMB, CKMBINDEX, TROPONINI in the last 168 hours. BNP (last 3 results) No results for input(s): PROBNP in the last 8760 hours. CBG:  Recent Labs Lab 09/14/15 2053  GLUCAP 149*   D-Dimer: No results for input(s): DDIMER in the last 72 hours. Hgb A1c: No results for input(s): HGBA1C in the last 72 hours. Lipid Profile: No results for input(s): CHOL, HDL, LDLCALC, TRIG, CHOLHDL, LDLDIRECT in the last 72 hours. Thyroid function studies: No results for input(s): TSH, T4TOTAL, T3FREE, THYROIDAB in the last 72 hours.  Invalid input(s): FREET3 Anemia work up:  Recent Labs  09/16/15 1819  RETICCTPCT 1.1   Sepsis Labs:  Recent Labs Lab 09/13/15 2010 09/15/15 0424 09/17/15 0550  WBC 22.0* 16.5* 26.7*   Microbiology Recent Results (from the past 240 hour(s))  Wet prep, genital     Status: Abnormal   Collection Time: 09/13/15 11:00 PM  Result Value Ref Range Status   Yeast Wet Prep HPF POC NONE SEEN NONE SEEN Final   Trich, Wet Prep NONE SEEN NONE SEEN Final   Clue Cells Wet Prep HPF POC PRESENT (A) NONE SEEN Final   WBC, Wet Prep HPF POC MODERATE (A) NONE SEEN Final   Sperm NONE SEEN  Final     Medications:   . metronidazole  500 mg Intravenous Q8H  . piperacillin-tazobactam (ZOSYN)  IV  3.375 g Intravenous Q8H   Continuous Infusions: . sodium chloride 125 mL/hr at 09/16/15 1900    Time spent: 15 min   LOS: 4 days   Charlynne Cousins  [facility] Pager [phone]  *Please refer to [email or web address], password [email or web address] to get updated schedule on who will round on this patient, as hospitalists switch teams weekly. If 7PM-7AM, please contact night-coverage at [email or web address], password [email or web address] for any overnight needs.  09/17/2015, 8:27 AM

## 2015-09-18 ENCOUNTER — Inpatient Hospital Stay (HOSPITAL_COMMUNITY): Payer: BC Managed Care – PPO

## 2015-09-18 DIAGNOSIS — D649 Anemia, unspecified: Secondary | ICD-10-CM

## 2015-09-18 DIAGNOSIS — D696 Thrombocytopenia, unspecified: Secondary | ICD-10-CM

## 2015-09-18 DIAGNOSIS — C92Z0 Other myeloid leukemia not having achieved remission: Secondary | ICD-10-CM

## 2015-09-18 DIAGNOSIS — R1110 Vomiting, unspecified: Secondary | ICD-10-CM | POA: Insufficient documentation

## 2015-09-18 DIAGNOSIS — N739 Female pelvic inflammatory disease, unspecified: Secondary | ICD-10-CM

## 2015-09-18 DIAGNOSIS — D72829 Elevated white blood cell count, unspecified: Secondary | ICD-10-CM

## 2015-09-18 DIAGNOSIS — R112 Nausea with vomiting, unspecified: Secondary | ICD-10-CM

## 2015-09-18 LAB — COMPREHENSIVE METABOLIC PANEL
ALBUMIN: 2.5 g/dL — AB (ref 3.5–5.0)
ALK PHOS: 35 U/L — AB (ref 38–126)
ALT: 24 U/L (ref 14–54)
AST: 24 U/L (ref 15–41)
Anion gap: 8 (ref 5–15)
BUN: 7 mg/dL (ref 6–20)
CALCIUM: 7.9 mg/dL — AB (ref 8.9–10.3)
CO2: 22 mmol/L (ref 22–32)
CREATININE: 0.8 mg/dL (ref 0.44–1.00)
Chloride: 106 mmol/L (ref 101–111)
GFR calc Af Amer: >60 mL/min (ref >60)
GFR calc non Af Amer: >60 mL/min (ref >60)
GLUCOSE: 164 mg/dL — AB (ref 65–99)
Potassium: 3.4 mmol/L — ABNORMAL LOW (ref 3.5–5.1)
SODIUM: 136 mmol/L (ref 135–145)
Total Bilirubin: 0.6 mg/dL (ref 0.3–1.2)
Total Protein: 5.5 g/dL — ABNORMAL LOW (ref 6.5–8.1)

## 2015-09-18 LAB — LACTIC ACID, PLASMA
Lactic Acid, Venous: 1.2 mmol/L (ref 0.5–2.0)
Lactic Acid, Venous: 0.8 mmol/L (ref 0.5–2.0)

## 2015-09-18 LAB — SAVE SMEAR

## 2015-09-18 LAB — URIC ACID: URIC ACID, SERUM: 2.3 mg/dL (ref 2.3–6.6)

## 2015-09-18 MED ORDER — METRONIDAZOLE IN NACL 5-0.79 MG/ML-% IV SOLN: 500.0000 mg | Freq: Three times a day (TID) | INTRAVENOUS | Status: DC

## 2015-09-18 MED ORDER — PIPERACILLIN-TAZOBACTAM 3.375 G IVPB: 3.3750 g | Freq: Three times a day (TID) | INTRAVENOUS | Status: DC

## 2015-09-18 MED ORDER — POTASSIUM CHLORIDE CRYS ER 20 MEQ PO TBCR: 20.0000 meq | EXTENDED_RELEASE_TABLET | Freq: Once | ORAL | Status: DC

## 2015-09-18 MED ORDER — SODIUM CHLORIDE 0.9 % IV SOLN
8.0000 mg | Freq: Three times a day (TID) | INTRAVENOUS | Status: DC
  Administered 2015-09-18: 8 mg via INTRAVENOUS
  Filled 2015-09-18 (×2): qty 4

## 2015-09-18 MED ORDER — BISACODYL 10 MG RE SUPP
10.0000 mg | Freq: Once | RECTAL | Status: AC
  Administered 2015-09-18: 10 mg via RECTAL
  Filled 2015-09-18: qty 1

## 2015-09-18 MED ORDER — OXYCODONE HCL 5 MG PO TABS: 5.0000 mg | ORAL_TABLET | ORAL | Status: DC | PRN

## 2015-09-18 MED ORDER — ONDANSETRON HCL 4 MG PO TABS: 4.0000 mg | ORAL_TABLET | Freq: Three times a day (TID) | ORAL | Status: DC

## 2015-09-18 MED ORDER — SODIUM CHLORIDE 0.9 % IV SOLN
8.0000 mg | Freq: Three times a day (TID) | INTRAVENOUS | Status: DC
  Filled 2015-09-18: qty 4

## 2015-09-18 NOTE — Progress Notes (Signed)
[facility] CONSULT NOTE  Patient Care Team: Yevette Edwards, NP as PCP - General (Family Medicine)  CHIEF COMPLAINTS/PURPOSE OF CONSULTATION:  Leukocytosis, increased blasts count on peripheral blood, suspicious for AML  HISTORY OF PRESENTING ILLNESS:  Denise Khan 30 y.o. female is here because of elevated WBC. The patient was admitted to the hospital since last site for fever and leukocytosis. 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 Please see note from Robynn Pane dated 09/15/2015.  Her baseline CBC from 01/21/2013 was within normal limits On 09/13/2015, white blood cell count was elevated at 22, hemoglobin 11.4 and platelet count 120,000. Over the last 5 days, white blood cell count has increased to 30.1, hemoglobin has dropped to 10.7 and platelet count is reduced to 90,000 She complained of pelvic pain and was diagnosed with pelvic inflammatory disease and is started on broad-spectrum IV antibiotics. CT scan  dated 09/13/2015 showed soft tissue inflammation and tracking to the site of the uterus, suspicious for pelvic inflammatory disease  she complained of profound nausea and vomiting.  She denies chest pain, shortness of breath or dizziness.  The patient denies any recent signs or symptoms of bleeding such as spontaneous epistaxis, hematuria or hematochezia.  MEDICAL HISTORY:  Past Medical History  Diagnosis Date  . PE (pulmonary embolism)     SURGICAL HISTORY: Past Surgical History  Procedure Laterality Date  . Knee surgery      SOCIAL HISTORY: Social History   Social History  . Marital Status: Single    Spouse Name: N/A  . Number of Children: N/A  . Years of Education: N/A   Occupational History  . Not on file.   Social History Main Topics  . Smoking status: Former  Smoker    Types: Cigarettes  . Smokeless tobacco: Never Used  . Alcohol Use: No  . Drug Use: No  . Sexual Activity: Yes    Birth Control/ Protection: Implant   Other Topics Concern  . Not on file   Social History Narrative    FAMILY HISTORY: Family History  Problem Relation Age of Onset  . CAD Father   . Hypertension Father   . Heart disease Father   . Hypertension Mother     ALLERGIES:  has No Known Allergies.  MEDICATIONS:  Current Facility-Administered Medications  Medication Dose Route Frequency Provider Last Rate Last Dose  . 0.9 %  sodium chloride infusion   Intravenous Continuous Ashley Murrain, NP 125 mL/hr at 09/18/15 0804    . acetaminophen (TYLENOL) tablet 1,000 mg  1,000 mg Oral Q6H PRN Charlynne Cousins, MD      . bisacodyl (DULCOLAX) EC tablet 5 mg  5 mg Oral Daily PRN Tanna Savoy Stinson, DO   5 mg at 09/17/15 1548  . busPIRone (BUSPAR) tablet 5 mg  5 mg Oral BID Charlynne Cousins, MD   5 mg at 09/18/15 0958  . HYDROmorphone (DILAUDID) injection 0.5 mg  0.5 mg Intravenous Q4H PRN Charlynne Cousins, MD   0.5 mg at 09/18/15 1258  . LORazepam (ATIVAN) tablet 1 mg  1 mg Oral Q6H PRN Florian Buff, MD   1 mg at 09/17/15 1548  . metroNIDAZOLE (FLAGYL) IVPB 500 mg  500 mg Intravenous Q8H Tanna Savoy Stinson, DO   500 mg at 09/18/15 1138  . ondansetron (ZOFRAN)  8 mg in sodium chloride 0.9 % 50 mL IVPB  8 mg Intravenous 3 times per day Heath Lark, MD       Or  . ondansetron (ZOFRAN) tablet 4 mg  4 mg Oral 3 times per day Heath Lark, MD      . oxyCODONE (Oxy IR/ROXICODONE) immediate release tablet 5 mg  5 mg Oral Q4H PRN Charlynne Cousins, MD   5 mg at 09/18/15 1258  . piperacillin-tazobactam (ZOSYN) IVPB 3.375 g  3.375 g Intravenous Q8H Tanna Savoy Stinson, DO   3.375 g at 09/18/15 7341  . potassium chloride SA (K-DUR,KLOR-CON) CR tablet 20 mEq  20 mEq Oral Once Orson Eva, MD      . senna-docusate (Senokot-S) tablet 1 tablet  1 tablet Oral QHS PRN Ashley Murrain, NP   1 tablet  at 09/15/15 1032  . simethicone (MYLICON) chewable tablet 80 mg  80 mg Oral QID PRN Ashley Murrain, NP      . traZODone (DESYREL) tablet 100 mg  100 mg Oral QHS PRN Charlynne Cousins, MD        REVIEW OF SYSTEMS:   Constitutional: Denies fevers, chills or abnormal night sweats Eyes: Denies blurriness of vision, double vision or watery eyes Ears, nose, mouth, throat, and face: Denies mucositis or sore throat Respiratory: Denies cough, dyspnea or wheezes Cardiovascular: Denies palpitation, chest discomfort or lower extremity swelling Gastrointestinal:  Denies nausea, heartburn or change in bowel habits Skin: Denies abnormal skin rashes Lymphatics: Denies new lymphadenopathy or easy bruising Neurological:Denies numbness, tingling or new weaknesses Behavioral/Psych: Mood is stable, no new changes  All other systems were reviewed with the patient and are negative.  PHYSICAL EXAMINATION: ECOG PERFORMANCE STATUS: 1 - Symptomatic but completely ambulatory  Filed Vitals:   09/17/15 2200 09/18/15 0447  BP: 143/84 130/74  Pulse: 87 105  Temp: 99 F (37.2 C) 99 F (37.2 C)  Resp: 16 18   Filed Weights   09/13/15 1915 09/14/15 0130 09/17/15 1705  Weight: 220 lb (99.791 kg) 232 lb 9.6 oz (105.507 kg) 235 lb 12.8 oz (106.958 kg)    GENERAL:alert, no distress and comfortable. She is obese. She is ill appearing  SKIN: skin color, texture, turgor are normal, no rashes or significant lesions EYES: normal, conjunctiva are pink and non-injected, sclera clear OROPHARYNX:no exudate, no erythema and lips, buccal mucosa, and tongue normal  NECK: supple, thyroid normal size, non-tender, without nodularity LYMPH:  no palpable lymphadenopathy in the cervical, axillary or inguinal LUNGS: clear to auscultation and percussion with normal breathing effort HEART: regular rate & rhythm and no murmurs and no lower extremity edema ABDOMEN:abdomen soft with diffuse tenderness especially in the left lower  quadrant region al:no cyanosis of digits and no clubbing  PSYCH: alert & oriented x 3 with fluent speech NEURO: no focal motor/sensory deficits  LABORATORY DATA:  I have reviewed the data as listed Recent Results (from the past 2160 hour(s))  GC/Chlamydia probe amp ([place])not at [facility]     Status: None   Collection Time: 09/13/15 12:00 AM  Result Value Ref Range   Chlamydia Negative     Comment: Normal Reference Range - Negative   Neisseria gonorrhea Negative     Comment: Normal Reference Range - Negative  Urinalysis, Routine w reflex microscopic (not at [facility])     Status: Abnormal   Collection Time: 09/13/15  7:40 PM  Result Value Ref Range   Color, Urine YELLOW YELLOW   APPearance CLEAR CLEAR  Specific Gravity, Urine 1.015 1.005 - 1.030   pH 8.0 5.0 - 8.0   Glucose, UA NEGATIVE NEGATIVE mg/dL   Hgb urine dipstick TRACE (A) NEGATIVE   Bilirubin Urine NEGATIVE NEGATIVE   Ketones, ur >80 (A) NEGATIVE mg/dL   Protein, ur NEGATIVE NEGATIVE mg/dL   Nitrite NEGATIVE NEGATIVE   Leukocytes, UA NEGATIVE NEGATIVE  Pregnancy, urine     Status: None   Collection Time: 09/13/15  7:40 PM  Result Value Ref Range   Preg Test, Ur NEGATIVE NEGATIVE  Urine microscopic-add on     Status: Abnormal   Collection Time: 09/13/15  7:40 PM  Result Value Ref Range   Squamous Epithelial / LPF 6-30 (A) NONE SEEN   WBC, UA 0-5 0 - 5 WBC/hpf   RBC / HPF 0-5 0 - 5 RBC/hpf   Bacteria, UA RARE (A) NONE SEEN  CBC with Differential     Status: Abnormal   Collection Time: 09/13/15  8:10 PM  Result Value Ref Range   WBC 22.0 (H) 4.0 - 10.5 K/uL    Comment: WHITE COUNT CONFIRMED ON SMEAR REPEATED TO VERIFY    RBC 3.42 (L) 3.87 - 5.11 MIL/uL   Hemoglobin 11.4 (L) 12.0 - 15.0 g/dL   HCT 32.4 (L) 36.0 - 46.0 %   MCV 94.7 78.0 - 100.0 fL   MCH 33.3 26.0 - 34.0 pg   MCHC 35.2 30.0 - 36.0 g/dL   RDW 14.3 11.5 - 15.5 %   Platelets 120 (L) 150 - 400 K/uL   Eosinophils Relative 0 %   Eosinophils Absolute  0.0 0.0 - 0.7 K/uL    Comment: CORRECTED ON 03/14 AT 2050: PREVIOUSLY REPORTED AS 0.1   Basophils Relative 0 %   Basophils Absolute 0.0 0.0 - 0.1 K/uL   WBC Morphology ATYPICAL LYMPHOCYTES    RBC Morphology POLYCHROMASIA PRESENT    Neutrophils Relative % 37 %   Lymphocytes Relative 57 %   Monocytes Relative 6 %   Neutro Abs 8.1 (H) 1.7 - 7.7 K/uL   Lymphs Abs 12.6 (H) 0.7 - 4.0 K/uL   Monocytes Absolute 1.3 (H) 0.1 - 1.0 K/uL  Comprehensive metabolic panel     Status: Abnormal   Collection Time: 09/13/15  8:10 PM  Result Value Ref Range   Sodium 134 (L) 135 - 145 mmol/L   Potassium 3.8 3.5 - 5.1 mmol/L   Chloride 104 101 - 111 mmol/L   CO2 22 22 - 32 mmol/L   Glucose, Bld 107 (H) 65 - 99 mg/dL   BUN 6 6 - 20 mg/dL   Creatinine, Ser 0.77 0.44 - 1.00 mg/dL   Calcium 8.4 (L) 8.9 - 10.3 mg/dL   Total Protein 6.8 6.5 - 8.1 g/dL   Albumin 3.4 (L) 3.5 - 5.0 g/dL   AST 32 15 - 41 U/L   ALT 28 14 - 54 U/L   Alkaline Phosphatase 46 38 - 126 U/L   Total Bilirubin 0.7 0.3 - 1.2 mg/dL   GFR calc non Af Amer >60 >60 mL/min   GFR calc Af Amer >60 >60 mL/min    Comment: (NOTE) The eGFR has been calculated using the CKD EPI equation. This calculation has not been validated in all clinical situations. eGFR's persistently <60 mL/min signify possible Chronic Kidney Disease.    Anion gap 8 5 - 15  Wet prep, genital     Status: Abnormal   Collection Time: 09/13/15 11:00 PM  Result Value Ref Range  Yeast Wet Prep HPF POC NONE SEEN NONE SEEN   Trich, Wet Prep NONE SEEN NONE SEEN   Clue Cells Wet Prep HPF POC PRESENT (A) NONE SEEN   WBC, Wet Prep HPF POC MODERATE (A) NONE SEEN   Sperm NONE SEEN   Glucose, capillary     Status: Abnormal   Collection Time: 09/14/15  8:53 PM  Result Value Ref Range   Glucose-Capillary 149 (H) 65 - 99 mg/dL  CBC with Differential/Platelet     Status: Abnormal   Collection Time: 09/15/15  4:24 AM  Result Value Ref Range   WBC 16.5 (H) 4.0 - 10.5 K/uL   RBC  3.00 (L) 3.87 - 5.11 MIL/uL   Hemoglobin 10.2 (L) 12.0 - 15.0 g/dL   HCT 28.8 (L) 36.0 - 46.0 %   MCV 96.0 78.0 - 100.0 fL   MCH 34.0 26.0 - 34.0 pg   MCHC 35.4 30.0 - 36.0 g/dL   RDW 14.6 11.5 - 15.5 %   Platelets 109 (L) 150 - 400 K/uL   Neutrophils Relative % 42 %   Lymphocytes Relative 8 %   Monocytes Relative 46 %   Eosinophils Relative 0 %   Basophils Relative 0 %   Band Neutrophils 0 %   Metamyelocytes Relative 0 %   Myelocytes 0 %   Promyelocytes Absolute 0 %   Blasts 4 %   nRBC 0 0 /100 WBC   Neutro Abs 6.9 1.7 - 7.7 K/uL   Lymphs Abs 1.3 0.7 - 4.0 K/uL   Monocytes Absolute 7.6 (H) 0.1 - 1.0 K/uL   Eosinophils Absolute 0.0 0.0 - 0.7 K/uL   Basophils Absolute 0.0 0.0 - 0.1 K/uL   WBC Morphology ATYPICAL MONONUCLEAR CELLS    Smear Review PENDING PATHOLOGIST REVIEW   Pathologist smear review     Status: None   Collection Time: 09/15/15  6:00 AM  Result Value Ref Range   Path Review Reviewed by Kalman Drape, M.D.     Comment: 03.16.17 INCREASED ATYPICAL/IMMATURE MONONUCLEAR CELLS, HYPOGRANULATED NEUTROPHILS AND RARE NUCLEATED RED CELLS. RECOMMEND HEMATOLOGIC WORKUP. Performed at [facility]   Mononucleosis screen     Status: None   Collection Time: 09/16/15  6:04 PM  Result Value Ref Range   Mono Screen NEGATIVE NEGATIVE  Vitamin B12     Status: None   Collection Time: 09/16/15  6:08 PM  Result Value Ref Range   Vitamin B-12 711 180 - 914 pg/mL    Comment: (NOTE) This assay is not validated for testing neonatal or myeloproliferative syndrome specimens for Vitamin B12 levels. Performed at [facility]   Folate     Status: None   Collection Time: 09/16/15  6:08 PM  Result Value Ref Range   Folate 11.9 >5.9 ng/mL    Comment: Performed at [facility]  Iron and TIBC     Status: Abnormal   Collection Time: 09/16/15  6:08 PM  Result Value Ref Range   Iron 46 28 - 170 ug/dL   TIBC 231 (L) 250 - 450 ug/dL   Saturation Ratios  20 10.4 - 31.8 %   UIBC 185 ug/dL    Comment: Performed at [facility]  Ferritin     Status: Abnormal   Collection Time: 09/16/15  6:08 PM  Result Value Ref Range   Ferritin 791 (H) 11 - 307 ng/mL    Comment: Performed at [facility]  Lactate dehydrogenase     Status:  Abnormal   Collection Time: 09/16/15  6:19 PM  Result Value Ref Range   LDH 432 (H) 98 - 192 U/L  Reticulocytes     Status: Abnormal   Collection Time: 09/16/15  6:19 PM  Result Value Ref Range   Retic Ct Pct 1.1 0.4 - 3.1 %   RBC. 3.56 (L) 3.87 - 5.11 MIL/uL   Retic Count, Manual 39.2 19.0 - 186.0 K/uL  CBC     Status: Abnormal   Collection Time: 09/17/15  5:50 AM  Result Value Ref Range   WBC 26.7 (H) 4.0 - 10.5 K/uL   RBC 3.22 (L) 3.87 - 5.11 MIL/uL   Hemoglobin 10.7 (L) 12.0 - 15.0 g/dL   HCT 30.4 (L) 36.0 - 46.0 %   MCV 94.4 78.0 - 100.0 fL   MCH 33.2 26.0 - 34.0 pg   MCHC 35.2 30.0 - 36.0 g/dL   RDW 14.4 11.5 - 15.5 %   Platelets 97 (L) 150 - 400 K/uL    Comment: SPECIMEN CHECKED FOR CLOTS PLATELET COUNT CONFIRMED BY SMEAR   Save smear     Status: None   Collection Time: 09/18/15 10:42 AM  Result Value Ref Range   Smear Review SMEAR STAINED AND AVAILABLE FOR REVIEW   CBC with Differential/Platelet     Status: Abnormal   Collection Time: 09/18/15 10:42 AM  Result Value Ref Range   WBC 30.1 (H) 4.0 - 10.5 K/uL   RBC 3.24 (L) 3.87 - 5.11 MIL/uL   Hemoglobin 10.7 (L) 12.0 - 15.0 g/dL   HCT 29.3 (L) 36.0 - 46.0 %   MCV 90.4 78.0 - 100.0 fL   MCH 33.0 26.0 - 34.0 pg   MCHC 36.5 (H) 30.0 - 36.0 g/dL   RDW 14.5 11.5 - 15.5 %   Platelets 90 (L) 150 - 400 K/uL    Comment: RESULT REPEATED AND VERIFIED SPECIMEN CHECKED FOR CLOTS PLATELET COUNT CONFIRMED BY SMEAR    Neutrophils Relative % 46 %   Lymphocytes Relative 27 %   Monocytes Relative 18 %   Eosinophils Relative 0 %   Basophils Relative 0 %   Metamyelocytes Relative 4 %   Myelocytes 5 %   Neutro Abs 16.6 (H) 1.7 - 7.7 K/uL    Lymphs Abs 8.1 (H) 0.7 - 4.0 K/uL   Monocytes Absolute 5.4 (H) 0.1 - 1.0 K/uL   Eosinophils Absolute 0.0 0.0 - 0.7 K/uL   Basophils Absolute 0.0 0.0 - 0.1 K/uL   RBC Morphology RARE NRBCs    WBC Morphology      MODERATE LEFT SHIFT (>5% METAS AND MYELOS,OCC PRO NOTED)    Comment: VACUOLATED NEUTROPHILS  Lactic acid, plasma     Status: None   Collection Time: 09/18/15 10:42 AM  Result Value Ref Range   Lactic Acid, Venous 1.2 0.5 - 2.0 mmol/L  Comprehensive metabolic panel     Status: Abnormal   Collection Time: 09/18/15 10:57 AM  Result Value Ref Range   Sodium 136 135 - 145 mmol/L   Potassium 3.4 (L) 3.5 - 5.1 mmol/L   Chloride 106 101 - 111 mmol/L   CO2 22 22 - 32 mmol/L   Glucose, Bld 164 (H) 65 - 99 mg/dL   BUN 7 6 - 20 mg/dL   Creatinine, Ser 0.80 0.44 - 1.00 mg/dL   Calcium 7.9 (L) 8.9 - 10.3 mg/dL   Total Protein 5.5 (L) 6.5 - 8.1 g/dL   Albumin 2.5 (L) 3.5 - 5.0 g/dL  AST 24 15 - 41 U/L   ALT 24 14 - 54 U/L   Alkaline Phosphatase 35 (L) 38 - 126 U/L   Total Bilirubin 0.6 0.3 - 1.2 mg/dL   GFR calc non Af Amer >60 >60 mL/min   GFR calc Af Amer >60 >60 mL/min    Comment: (NOTE) The eGFR has been calculated using the CKD EPI equation. This calculation has not been validated in all clinical situations. eGFR's persistently <60 mL/min signify possible Chronic Kidney Disease.    Anion gap 8 5 - 15  Uric acid     Status: None   Collection Time: 09/18/15 10:57 AM  Result Value Ref Range   Uric Acid, Serum 2.3 2.3 - 6.6 mg/dL    RADIOGRAPHIC STUDIES: I have personally reviewed the radiological images as listed and agreed with the findings in the report. Dg Chest 2 View  09/18/2015  CLINICAL DATA:  Persistent fever.  Leukocytosis. EXAM: CHEST  2 VIEW COMPARISON:  Previous examinations. FINDINGS: Normal sized heart. Clear lungs. Minimal thoracic spine degenerative changes. IMPRESSION: No acute abnormality. Electronically Signed   By: Claudie Revering M.D.   On: 09/18/2015 13:53    US Transvaginal Non-ob  09/14/2015  CLINICAL DATA:  Cystic abnormality noted on CT EXAM: TRANSABDOMINAL AND TRANSVAGINAL ULTRASOUND OF PELVIS DOPPLER ULTRASOUND OF OVARIES TECHNIQUE: Both transabdominal and transvaginal ultrasound examinations of the pelvis were performed. Transabdominal technique was performed for global imaging of the pelvis including uterus, ovaries, adnexal regions, and pelvic cul-de-sac. It was necessary to proceed with endovaginal exam following the transabdominal exam to visualize the endometrium and adnexa. Color and duplex Doppler ultrasound was utilized to evaluate blood flow to the ovaries. COMPARISON:  Yesterday FINDINGS: Uterus Measurements: 9.8 x 4.6 x 6.6 cm. Multiple fibroids are identified. Posterior fundal fibroid measures 1.5 x 1.6 x 1.2 cm. Second posterior fundal fibroid measures 1.9 x 1.6 x 0.9 cm. Anterior lower fibroid measures 1.1 x 1.3 x 0.7 cm. Endometrium Thickness: 5 mm.  No focal abnormality visualized. Right ovary Measurements: 3.4 x 1.6 x 3.0 cm. No focal abnormality. Left ovary Measurements: 6.4 x 2.3 x 4.8 cm. There are 2 simple cysts in the left ovary. One measures 3.7 cm and the other measures 3.4 cm Pulsed Doppler evaluation of both ovaries demonstrates normal low-resistance arterial and venous waveforms. Other findings Trace free-fluid. IMPRESSION: No evidence of ovarian torsion. Multiple uterine fibroids as described above. Trace free fluid is likely physiologic. The CT abnormality corresponds to benign appearing cysts in the left ovary. The larger one measures 3.7 cm and does not require follow-up in a premenopausal patient. Electronically Signed   By: Marybelle Killings M.D.   On: 09/14/2015 10:44   US Pelvis Complete  09/14/2015  CLINICAL DATA:  Cystic abnormality noted on CT EXAM: TRANSABDOMINAL AND TRANSVAGINAL ULTRASOUND OF PELVIS DOPPLER ULTRASOUND OF OVARIES TECHNIQUE: Both transabdominal and transvaginal ultrasound examinations of the pelvis were  performed. Transabdominal technique was performed for global imaging of the pelvis including uterus, ovaries, adnexal regions, and pelvic cul-de-sac. It was necessary to proceed with endovaginal exam following the transabdominal exam to visualize the endometrium and adnexa. Color and duplex Doppler ultrasound was utilized to evaluate blood flow to the ovaries. COMPARISON:  Yesterday FINDINGS: Uterus Measurements: 9.8 x 4.6 x 6.6 cm. Multiple fibroids are identified. Posterior fundal fibroid measures 1.5 x 1.6 x 1.2 cm. Second posterior fundal fibroid measures 1.9 x 1.6 x 0.9 cm. Anterior lower fibroid measures 1.1 x 1.3 x 0.7 cm. Endometrium  Thickness: 5 mm.  No focal abnormality visualized. Right ovary Measurements: 3.4 x 1.6 x 3.0 cm. No focal abnormality. Left ovary Measurements: 6.4 x 2.3 x 4.8 cm. There are 2 simple cysts in the left ovary. One measures 3.7 cm and the other measures 3.4 cm Pulsed Doppler evaluation of both ovaries demonstrates normal low-resistance arterial and venous waveforms. Other findings Trace free-fluid. IMPRESSION: No evidence of ovarian torsion. Multiple uterine fibroids as described above. Trace free fluid is likely physiologic. The CT abnormality corresponds to benign appearing cysts in the left ovary. The larger one measures 3.7 cm and does not require follow-up in a premenopausal patient. Electronically Signed   By: Marybelle Killings M.D.   On: 09/14/2015 10:44   [place] Art/ven Flow Abd Pelv Doppler  09/14/2015  CLINICAL DATA:  Cystic abnormality noted on CT EXAM: TRANSABDOMINAL AND TRANSVAGINAL ULTRASOUND OF PELVIS DOPPLER ULTRASOUND OF OVARIES TECHNIQUE: Both transabdominal and transvaginal ultrasound examinations of the pelvis were performed. Transabdominal technique was performed for global imaging of the pelvis including uterus, ovaries, adnexal regions, and pelvic cul-de-sac. It was necessary to proceed with endovaginal exam following the transabdominal exam to visualize the  endometrium and adnexa. Color and duplex Doppler ultrasound was utilized to evaluate blood flow to the ovaries. COMPARISON:  Yesterday FINDINGS: Uterus Measurements: 9.8 x 4.6 x 6.6 cm. Multiple fibroids are identified. Posterior fundal fibroid measures 1.5 x 1.6 x 1.2 cm. Second posterior fundal fibroid measures 1.9 x 1.6 x 0.9 cm. Anterior lower fibroid measures 1.1 x 1.3 x 0.7 cm. Endometrium Thickness: 5 mm.  No focal abnormality visualized. Right ovary Measurements: 3.4 x 1.6 x 3.0 cm. No focal abnormality. Left ovary Measurements: 6.4 x 2.3 x 4.8 cm. There are 2 simple cysts in the left ovary. One measures 3.7 cm and the other measures 3.4 cm Pulsed Doppler evaluation of both ovaries demonstrates normal low-resistance arterial and venous waveforms. Other findings Trace free-fluid. IMPRESSION: No evidence of ovarian torsion. Multiple uterine fibroids as described above. Trace free fluid is likely physiologic. The CT abnormality corresponds to benign appearing cysts in the left ovary. The larger one measures 3.7 cm and does not require follow-up in a premenopausal patient. Electronically Signed   By: Marybelle Killings M.D.   On: 09/14/2015 10:44   Ct Renal Stone Study  09/13/2015  CLINICAL DATA:  Acute onset of right-sided back pain, with nausea, vomiting and hematuria. Initial encounter. EXAM: CT ABDOMEN AND PELVIS WITHOUT CONTRAST TECHNIQUE: Multidetector CT imaging of the abdomen and pelvis was performed following the standard protocol without IV contrast. COMPARISON:  Abdominal ultrasound performed 10/25/2014 FINDINGS: Minimal bibasilar scarring is noted. The liver and spleen are unremarkable in appearance. The gallbladder is within normal limits. The pancreas and adrenal glands are unremarkable. The kidneys are unremarkable in appearance. There is no evidence of hydronephrosis. No renal or ureteral stones are seen. No perinephric stranding is appreciated. No free fluid is identified. The small bowel is  unremarkable in appearance. The stomach is within normal limits. No acute vascular abnormalities are seen. The appendix is normal in caliber, without evidence of appendicitis. The colon is unremarkable in appearance. The bladder is mildly distended, with suggestion of minimal inflammation. Soft tissue inflammation is seen tracking about both sides of the uterus, with a cystic structure at the left adnexa measuring approximately 5.7 cm. Would correlate for any evidence of pelvic inflammatory disease. Pelvic ultrasound would be helpful for further evaluation, as deemed clinically appropriate. No inguinal lymphadenopathy is seen. No acute osseous  abnormalities are identified. IMPRESSION: 1. Soft tissue inflammation about both sides of the uterus, with a cystic structure at the left adnexa measuring 5.7 cm. Would correlate for any evidence of pelvic inflammatory disease. Pelvic ultrasound would be helpful for further evaluation, as deemed clinically appropriate. 2. Suggestion of minimal inflammation about the bladder. Electronically Signed   By: Garald Balding M.D.   On: 09/13/2015 21:15   I reviewed her peripheral blood smear today which showed leukoerythroblastic picture with estimated 20% blasts by my count. Some of the blasts have Auer Rods but they are not in abundance like an APL picture. Numerous nucleated RBC is seen. 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 I told the patient and family members she needs to be transferred ASAP to tertiary facility to facilitate workup and treatment for AML I do not recommend bone marrow aspirate and biopsy here as it will unnecessarily delay treatment.  #2 anemia and thrombocytopenia, could be related to AML She is not symptomatic. No transfusion is needed  #3 pelvic inflammatory disease Fever has resolved. Continue  broad-spectrum IV antibiotic therapy  #4 severe nausea/vomiting Could be related to infection. I will schedule IV Zofran  #5 discharge planning Pending transfer to [facility] I will sign off.  [facility], Jhonnie Aliano, MD 09/18/2015

## 2015-09-18 NOTE — Discharge Summary (Signed)
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 Alvy Bimler who recommended transfer to tertiary medical center for AML ASAP -spoke with Dr. Junius Finner at [facility] who accepted the patient in transfer -blood cultures x 2 -UA and urine culture -CXR -uric acid--plan to start allopurinol if uric acid >5 -continue IVF -continue zosyn D#2 -concerns for AML Pelvic inflammatory disease -NAAT for GC and chlamydia are negative on 09/13/15 -wet prep also showed Clue cells -Continue Zosyn and metronidazole -Pelvic ultrasound negative for tubo-ovarian abscess Persistent fever -Blood cultures 2 sets -UA and urine culture -Chest x-ray -Certainly, this may be related to the patient's AML -labs as discussed above Hyponatremia -Continue IV fluids -due to volume depletion  Discharge Condition: stable  Disposition: transfer to [facility] Readings from Last 3 Encounters:  09/17/15 106.958 kg (235 lb 12.8 oz)  04/29/15 109.317 kg (241 lb)  03/02/14 92.987 kg (205 lb)    History of present illness:  30 year old female with history of pulmonary embolus presented to the emergency department on 09/13/2015 with 1 week history of abdominal pain, urinary frequency, and nausea. CT of the abdomen and pelvis performed in the emergency department revealed soft tissue inflammatory changes in both sides of her uterus with a cystic structure in the left adnexa. Pelvic exam at that time revealed cervical motion tenderness and a friable cervix with bilateral adnexal tenderness. The patient was admitted to the hospital for intravenous antibiotics due to her fever and elevated WBC count of 22,000.  Follow-up vaginal ultrasound revealed that her cystic abnormality corresponded to left ovarian cysts. There was no ovarian torsion. The patient was started on Zosyn and metronidazole for treatment of pelvic inflammatory disease. Although her WBC count increased initially down to 16.5, it increased again up to 26.7. Review of the medical record shows that the patient had 4% blasts on a CBC differential. Medical oncology was contacted, and Robynn Pane, PA-C reviewed a peripheral smear with the pathologist who confirmed ~35% blasts on peripheral smear. Apparently, this was followed by peripheral FLOW and this report is not yet signed out as MPO is still pending showing 24% blasts appearing to be myeloid in nature.As result, the patient was sent to [facility] for bone marrow biopsy to be scheduled on 09/19/2015. In the interim, the patient continues to have fevers although she remains hemodynamically stable. Due to concerns of acute myelogenous leukemia, hematology/oncology was consulted. Dr. Alvy Bimler recommended transfer to a tertiary [facility]. I discussed with Dr. Junius Finner at [facility] who was willing to accept the patient in transfer. In the interim, the patient will continue on intravenous fluids and intravenous Zosyn. Blood cultures, urine cultures, chest x-ray, and uric acid will be checked prior to the transfer. She will continue on intravenous fluids.  Discharge Exam: Filed Vitals:   09/17/15 2200 09/18/15 0447  BP: 143/84 130/74  Pulse: 87 105  Temp: 99 F (37.2 C) 99 F (37.2 C)  Resp: 16 18   Filed Vitals:   09/17/15 1244 09/17/15 1705 09/17/15 2200 09/18/15 0447  BP: 144/90 137/64 143/84 130/74  Pulse: 96 92 87 105  Temp: 100.4 F (38 C) 98.8 F (37.1 C) 99 F (37.2 C) 99 F (37.2 C)  TempSrc: Oral Oral Oral Oral  Resp: '20 18 16 18  ' Height:  '5\' 10"'  (1.778 m)    Weight:  106.958 kg (235 lb 12.8 oz)    SpO2: 100% 99% 100% 100%   General: A&O x 3, NAD,  pleasant, cooperative Cardiovascular: RRR, no rub, no gallop, no S3 Respiratory: CTAB, no wheeze, no rhonchi Abdomen:soft, nontender, nondistended, positive bowel sounds Extremities: No edema, No lymphangitis, no petechiae  Discharge Instructions      Discharge Instructions    Diet - low sodium heart healthy    Complete by:  As directed      Increase activity slowly    Complete by:  As directed             Medication List    TAKE these medications        acetaminophen 500 MG tablet  Commonly known as:  TYLENOL  Take 1,000 mg by mouth every 6 (six) hours as needed for moderate pain.     busPIRone 5 MG tablet  Commonly known as:  BUSPAR  Take 5 mg by mouth 2 (two) times daily.     etonogestrel 68 MG Impl implant  Commonly known as:  NEXPLANON  1 each by Subdermal route once.     metroNIDAZOLE 5-0.79 MG/ML-% IVPB  Commonly known as:  FLAGYL  Inject 100 mLs (500 mg total) into the vein every 8 (eight) hours.     oxyCODONE 5 MG immediate release tablet  Commonly known as:  Oxy IR/ROXICODONE  Take 1 tablet (5 mg total) by mouth every 4 (four) hours as needed for moderate pain.     piperacillin-tazobactam 3.375 GM/50ML IVPB  Commonly known as:  ZOSYN  Inject 50 mLs (3.375 g total) into the vein every 8 (eight) hours.     traZODone 100 MG tablet  Commonly known as:  DESYREL  Take 100 mg by mouth at bedtime as needed for sleep.     ZYRTEC PO  Take 1 tablet by mouth daily.         The results of significant diagnostics from this hospitalization (including imaging, microbiology, ancillary and laboratory) are listed below for reference.    Significant Diagnostic Studies: US Transvaginal Non-ob  09/23/2015  CLINICAL DATA:  Cystic abnormality noted on CT EXAM: TRANSABDOMINAL AND TRANSVAGINAL ULTRASOUND OF PELVIS DOPPLER ULTRASOUND OF OVARIES TECHNIQUE: Both transabdominal and transvaginal ultrasound examinations of the pelvis were performed. Transabdominal technique  was performed for global imaging of the pelvis including uterus, ovaries, adnexal regions, and pelvic cul-de-sac. It was necessary to proceed with endovaginal exam following the transabdominal exam to visualize the endometrium and adnexa. Color and duplex Doppler ultrasound was utilized to evaluate blood flow to the ovaries. COMPARISON:  Yesterday FINDINGS: Uterus Measurements: 9.8 x 4.6 x 6.6 cm. Multiple fibroids are identified. Posterior fundal fibroid measures 1.5 x 1.6 x 1.2 cm. Second posterior fundal fibroid measures 1.9 x 1.6 x 0.9 cm. Anterior lower fibroid measures 1.1 x 1.3 x 0.7 cm. Endometrium Thickness: 5 mm.  No focal abnormality visualized. Right ovary Measurements: 3.4 x 1.6 x 3.0 cm. No focal abnormality. Left ovary Measurements: 6.4 x 2.3 x 4.8 cm. There are 2 simple cysts in the left ovary. One measures 3.7 cm and the other measures 3.4 cm Pulsed Doppler evaluation of both ovaries demonstrates normal low-resistance arterial and venous waveforms. Other findings Trace free-fluid. IMPRESSION: No evidence of ovarian torsion. Multiple uterine fibroids as described above. Trace free fluid is likely physiologic. The CT abnormality corresponds to benign appearing cysts in  the left ovary. The larger one measures 3.7 cm and does not require follow-up in a premenopausal patient. Electronically Signed   By: Marybelle Killings M.D.   On: 09/14/2015 10:44   US Pelvis Complete  09/14/2015  CLINICAL DATA:  Cystic abnormality noted on CT EXAM: TRANSABDOMINAL AND TRANSVAGINAL ULTRASOUND OF PELVIS DOPPLER ULTRASOUND OF OVARIES TECHNIQUE: Both transabdominal and transvaginal ultrasound examinations of the pelvis were performed. Transabdominal technique was performed for global imaging of the pelvis including uterus, ovaries, adnexal regions, and pelvic cul-de-sac. It was necessary to proceed with endovaginal exam following the transabdominal exam to visualize the endometrium and adnexa. Color and duplex Doppler  ultrasound was utilized to evaluate blood flow to the ovaries. COMPARISON:  Yesterday FINDINGS: Uterus Measurements: 9.8 x 4.6 x 6.6 cm. Multiple fibroids are identified. Posterior fundal fibroid measures 1.5 x 1.6 x 1.2 cm. Second posterior fundal fibroid measures 1.9 x 1.6 x 0.9 cm. Anterior lower fibroid measures 1.1 x 1.3 x 0.7 cm. Endometrium Thickness: 5 mm.  No focal abnormality visualized. Right ovary Measurements: 3.4 x 1.6 x 3.0 cm. No focal abnormality. Left ovary Measurements: 6.4 x 2.3 x 4.8 cm. There are 2 simple cysts in the left ovary. One measures 3.7 cm and the other measures 3.4 cm Pulsed Doppler evaluation of both ovaries demonstrates normal low-resistance arterial and venous waveforms. Other findings Trace free-fluid. IMPRESSION: No evidence of ovarian torsion. Multiple uterine fibroids as described above. Trace free fluid is likely physiologic. The CT abnormality corresponds to benign appearing cysts in the left ovary. The larger one measures 3.7 cm and does not require follow-up in a premenopausal patient. Electronically Signed   By: Marybelle Killings M.D.   On: 09/14/2015 10:44   [place] Art/ven Flow Abd Pelv Doppler  09/14/2015  CLINICAL DATA:  Cystic abnormality noted on CT EXAM: TRANSABDOMINAL AND TRANSVAGINAL ULTRASOUND OF PELVIS DOPPLER ULTRASOUND OF OVARIES TECHNIQUE: Both transabdominal and transvaginal ultrasound examinations of the pelvis were performed. Transabdominal technique was performed for global imaging of the pelvis including uterus, ovaries, adnexal regions, and pelvic cul-de-sac. It was necessary to proceed with endovaginal exam following the transabdominal exam to visualize the endometrium and adnexa. Color and duplex Doppler ultrasound was utilized to evaluate blood flow to the ovaries. COMPARISON:  Yesterday FINDINGS: Uterus Measurements: 9.8 x 4.6 x 6.6 cm. Multiple fibroids are identified. Posterior fundal fibroid measures 1.5 x 1.6 x 1.2 cm. Second posterior fundal fibroid  measures 1.9 x 1.6 x 0.9 cm. Anterior lower fibroid measures 1.1 x 1.3 x 0.7 cm. Endometrium Thickness: 5 mm.  No focal abnormality visualized. Right ovary Measurements: 3.4 x 1.6 x 3.0 cm. No focal abnormality. Left ovary Measurements: 6.4 x 2.3 x 4.8 cm. There are 2 simple cysts in the left ovary. One measures 3.7 cm and the other measures 3.4 cm Pulsed Doppler evaluation of both ovaries demonstrates normal low-resistance arterial and venous waveforms. Other findings Trace free-fluid. IMPRESSION: No evidence of ovarian torsion. Multiple uterine fibroids as described above. Trace free fluid is likely physiologic. The CT abnormality corresponds to benign appearing cysts in the left ovary. The larger one measures 3.7 cm and does not require follow-up in a premenopausal patient. Electronically Signed   By: Marybelle Killings M.D.   On: 09/14/2015 10:44   Ct Renal Stone Study  09/13/2015  CLINICAL DATA:  Acute onset of right-sided back pain, with nausea, vomiting and hematuria. Initial encounter. EXAM: CT ABDOMEN AND PELVIS WITHOUT CONTRAST TECHNIQUE: Multidetector CT imaging of the abdomen and pelvis  was performed following the standard protocol without IV contrast. COMPARISON:  Abdominal ultrasound performed 10/25/2014 FINDINGS: Minimal bibasilar scarring is noted. The liver and spleen are unremarkable in appearance. The gallbladder is within normal limits. The pancreas and adrenal glands are unremarkable. The kidneys are unremarkable in appearance. There is no evidence of hydronephrosis. No renal or ureteral stones are seen. No perinephric stranding is appreciated. No free fluid is identified. The small bowel is unremarkable in appearance. The stomach is within normal limits. No acute vascular abnormalities are seen. The appendix is normal in caliber, without evidence of appendicitis. The colon is unremarkable in appearance. The bladder is mildly distended, with suggestion of minimal inflammation. Soft tissue  inflammation is seen tracking about both sides of the uterus, with a cystic structure at the left adnexa measuring approximately 5.7 cm. Would correlate for any evidence of pelvic inflammatory disease. Pelvic ultrasound would be helpful for further evaluation, as deemed clinically appropriate. No inguinal lymphadenopathy is seen. No acute osseous abnormalities are identified. IMPRESSION: 1. Soft tissue inflammation about both sides of the uterus, with a cystic structure at the left adnexa measuring 5.7 cm. Would correlate for any evidence of pelvic inflammatory disease. Pelvic ultrasound would be helpful for further evaluation, as deemed clinically appropriate. 2. Suggestion of minimal inflammation about the bladder. Electronically Signed   By: Garald Balding M.D.   On: 09/13/2015 21:15     Microbiology: Recent Results (from the past 240 hour(s))  Wet prep, genital     Status: Abnormal   Collection Time: 09/13/15 11:00 PM  Result Value Ref Range Status   Yeast Wet Prep HPF POC NONE SEEN NONE SEEN Final   Trich, Wet Prep NONE SEEN NONE SEEN Final   Clue Cells Wet Prep HPF POC PRESENT (A) NONE SEEN Final   WBC, Wet Prep HPF POC MODERATE (A) NONE SEEN Final   Sperm NONE SEEN  Final     Labs: Basic Metabolic Panel:  Recent Labs Lab 09/13/15 2010  NA 134*  K 3.8  CL 104  CO2 22  GLUCOSE 107*  BUN 6  CREATININE 0.77  CALCIUM 8.4*   Liver Function Tests:  Recent Labs Lab 09/13/15 2010  AST 32  ALT 28  ALKPHOS 46  BILITOT 0.7  PROT 6.8  ALBUMIN 3.4*   No results for input(s): LIPASE, AMYLASE in the last 168 hours. No results for input(s): AMMONIA in the last 168 hours. CBC:  Recent Labs Lab 09/13/15 2010 09/15/15 0424 09/17/15 0550  WBC 22.0* 16.5* 26.7*  NEUTROABS 8.1* 6.9  --   HGB 11.4* 10.2* 10.7*  HCT 32.4* 28.8* 30.4*  MCV 94.7 96.0 94.4  PLT 120* 109* 97*   Cardiac Enzymes: No results for input(s): CKTOTAL, CKMB, CKMBINDEX, TROPONINI in the last 168  hours. BNP: Invalid input(s): POCBNP CBG:  Recent Labs Lab 09/14/15 2053  GLUCAP 149*    Time coordinating discharge:  Greater than 30 minutes  Signed:  Ixchel Duck, DO [facility] Pager: [phone] 09/18/2015, 11:10 AM

## 2015-09-19 LAB — CBC WITH DIFFERENTIAL/PLATELET
BASOS PCT: 0 %
Basophils Absolute: 0 10*3/uL (ref 0.0–0.1)
Blasts: 20 %
EOS ABS: 0 10*3/uL (ref 0.0–0.7)
Eosinophils Relative: 0 %
HCT: 29.3 % — ABNORMAL LOW (ref 36.0–46.0)
Hemoglobin: 10.7 g/dL — ABNORMAL LOW (ref 12.0–15.0)
LYMPHS PCT: 13 %
Lymphs Abs: 3.9 10*3/uL (ref 0.7–4.0)
MCH: 33 pg (ref 26.0–34.0)
MCHC: 36.5 g/dL — ABNORMAL HIGH (ref 30.0–36.0)
MCV: 90.4 fL (ref 78.0–100.0)
METAMYELOCYTES PCT: 0 %
MONO ABS: 10.5 10*3/uL — AB (ref 0.1–1.0)
MONOS PCT: 35 %
Myelocytes: 2 %
Neutro Abs: 9.6 10*3/uL — ABNORMAL HIGH (ref 1.7–7.7)
Neutrophils Relative %: 30 %
PLATELETS: 90 10*3/uL — AB (ref 150–400)
RBC: 3.24 MIL/uL — ABNORMAL LOW (ref 3.87–5.11)
RDW: 14.5 % (ref 11.5–15.5)
WBC: 30.1 10*3/uL — ABNORMAL HIGH (ref 4.0–10.5)
nRBC: 2 /100 WBC — ABNORMAL HIGH

## 2015-09-23 LAB — CULTURE, BLOOD (ROUTINE X 2)
CULTURE: NO GROWTH
Culture: NO GROWTH

## 2017-02-06 ENCOUNTER — Ambulatory Visit (INDEPENDENT_AMBULATORY_CARE_PROVIDER_SITE_OTHER): Payer: BC Managed Care – PPO | Admitting: Family Medicine

## 2017-02-06 ENCOUNTER — Encounter: Payer: Self-pay | Admitting: Family Medicine

## 2017-02-06 VITALS — BP 122/72 | HR 72 | Temp 98.1°F | Resp 16 | Ht 70.0 in | Wt 247.0 lb

## 2017-02-06 DIAGNOSIS — Z6835 Body mass index (BMI) 35.0-35.9, adult: Secondary | ICD-10-CM | POA: Diagnosis not present

## 2017-02-06 DIAGNOSIS — L300 Nummular dermatitis: Secondary | ICD-10-CM

## 2017-02-06 DIAGNOSIS — Z9109 Other allergy status, other than to drugs and biological substances: Secondary | ICD-10-CM | POA: Diagnosis not present

## 2017-02-06 DIAGNOSIS — Z6838 Body mass index (BMI) 38.0-38.9, adult: Secondary | ICD-10-CM | POA: Insufficient documentation

## 2017-02-06 DIAGNOSIS — D259 Leiomyoma of uterus, unspecified: Secondary | ICD-10-CM | POA: Diagnosis not present

## 2017-02-06 DIAGNOSIS — C9201 Acute myeloblastic leukemia, in remission: Secondary | ICD-10-CM | POA: Diagnosis not present

## 2017-02-06 DIAGNOSIS — W57XXXA Bitten or stung by nonvenomous insect and other nonvenomous arthropods, initial encounter: Secondary | ICD-10-CM | POA: Diagnosis not present

## 2017-02-06 DIAGNOSIS — E669 Obesity, unspecified: Secondary | ICD-10-CM | POA: Insufficient documentation

## 2017-02-06 DIAGNOSIS — Z8679 Personal history of other diseases of the circulatory system: Secondary | ICD-10-CM | POA: Diagnosis not present

## 2017-02-06 MED ORDER — FLUOCINONIDE 0.05 % EX CREA: 1.0000 "application " | TOPICAL_CREAM | Freq: Two times a day (BID) | CUTANEOUS | 0 refills | Status: DC

## 2017-02-06 NOTE — Patient Instructions (Addendum)
Need blood testing I will notify you of your test results Use the lidex cream twice a day Call if the rash does not go away in a couple of weeks  See me in 3 months Call sooner for problems

## 2017-02-06 NOTE — Progress Notes (Signed)
Chief Complaint  Patient presents with  . Follow-up    est   First visit Health history reviewed Recent treatment for AML - currently in remission.  Feels well. 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 Has had 2 pulmonary embolus, 2015 and 2017.  Will be on xarelto for life Environmental allergies. health maintenance and shots all up to date Has 2 issues of concern 1. Recurring pain in L lower lung at site of most recent PE.  Brief.  Unprovoked. Sharp.  It worries her - each time she thinks it is another clot.  Maybe once a week or two.  No shortness of breath.  No persistent symptoms 2. Tick bite R lower leg.  NOT on skin more than a few hours.  Removed.  She has a circular rash that itches terribly ever since at the site.  NO fever or malaise.  No Joint pain. Diet is balanced.  No reg exercise - recommended.   Patient Active Problem List   Diagnosis Date Noted  . Uterine fibroid 02/06/2017  . Environmental allergies 02/06/2017  . Adult BMI 35.0-35.9 kg/sq m 02/06/2017  . 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[place]) 09/16/2015  . History of pulmonary embolus (PE) 01/05/2013    Outpatient Encounter Prescriptions as of 02/06/2017  Medication Sig  . acetaminophen (TYLENOL) 500 MG tablet Take 1,000 mg by mouth every 6 (six) hours as needed for moderate pain.  . Cetirizine HCl (ZYRTEC PO) Take 1 tablet by mouth daily.  Alveda Reasons 20 MG TABS tablet Take 20 mg by mouth daily with supper.   . etonogestrel (NEXPLANON) 68 MG IMPL implant 68 mg.  . fluocinonide cream (LIDEX) 6.21 % Apply 1 application topically 2 (two) times daily.   No facility-administered encounter medications on file as of 02/06/2017.     Past Medical History:  Diagnosis Date  . Anxiety   . Blood transfusion without reported diagnosis   . Cancer ([place])   . Clotting disorder ([place])   . PE (pulmonary embolism)     Past Surgical History:  Procedure Laterality Date  . KNEE SURGERY    . PORT-A-CATH  REMOVAL      Social History   Social History  . Marital status: Significant Other    Spouse name: N/A  . Number of children: 1  . Years of education: 76   Occupational History  . biology teacher     [employer]   Social History Main Topics  . Smoking status: Never Smoker  . Smokeless tobacco: Never Used  . Alcohol use No  . Drug use: No  . Sexual activity: Yes    Birth control/ protection: Implant   Other Topics Concern  . Not on file   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 with 37 yo son Brubeck   His dad is Jeneen Rinks, lives with them       Family History  Problem Relation Age of Onset  . CAD Father   . Hypertension Father   . Heart disease Father   . Hypertension Mother   . Cancer Maternal Grandmother        lung  . Stroke Maternal Grandfather   . Hypertension Maternal Grandfather   . Kidney disease Maternal Grandfather     Review of Systems  Constitutional: Negative for chills, fever and weight loss.  HENT: Negative for congestion and hearing loss.   Eyes: Negative for blurred vision and pain.  Respiratory: Negative  for cough and shortness of breath.   Cardiovascular: Positive for chest pain. Negative for leg swelling.  Gastrointestinal: Negative for abdominal pain, constipation, diarrhea and heartburn.  Genitourinary: Negative for dysuria and frequency.  Musculoskeletal: Negative for falls, joint pain and myalgias.  Skin: Positive for rash.  Neurological: Negative for dizziness, seizures and headaches.  Psychiatric/Behavioral: Negative for depression. The patient is not nervous/anxious and does not have insomnia.     BP 122/72 (BP Location: Right Arm, Patient Position: Sitting, Cuff Size: Large)   Pulse 72   Temp 98.1 F (36.7 C) (Temporal)   Resp 16   Ht 5\' 10"  (1.778 m)   Wt 247 lb (112 kg)   LMP 01/15/2017 (Exact Date)   SpO2 96%   BMI 35.44 kg/m   Physical Exam  Constitutional: She is oriented to person, place, and  time. She appears well-developed and well-nourished.  HENT:  Head: Normocephalic and atraumatic.  Mouth/Throat: Oropharynx is clear and moist.  Eyes: Pupils are equal, round, and reactive to light. Conjunctivae are normal.  Neck: Normal range of motion. Neck supple. No thyromegaly present.  Cardiovascular: Normal rate, regular rhythm and normal heart sounds.   Pulmonary/Chest: Effort normal and breath sounds normal. No respiratory distress.  Lungs auscultated carefully - no rub or abnormal sound  Abdominal: Soft. Bowel sounds are normal.  Musculoskeletal: Normal range of motion. She exhibits no edema.  Lymphadenopathy:    She has no cervical adenopathy.  Neurological: She is alert and oriented to person, place, and time.  Gait normal  Skin: Skin is warm and dry.  Below medial knee on right there is a 2 cm indurated, hyperpigmented vesicular lesion - no central clearing, slight exudate, slight scale  Psychiatric: She has a normal mood and affect. Her behavior is normal. Thought content normal.  Nursing note and vitals reviewed.  ASSESSMENT/PLAN:  1. Uterine leiomyoma, unspecified location - CBC with Differential/Platelet  2. Environmental allergies  3. Acute myeloid leukemia in remission ([place])  4. Nummular eczema At site of tick bite?  Will Tx with steroid cream and check lyme titer  5. History of hypertension - Lipid panel - COMPLETE METABOLIC PANEL WITH GFR  6. Tick bite, initial encounter - Lyme Ab/Western Blot Reflex  7. Adult BMI 35.0-35.9 kg/sq m   Patient Instructions  Need blood testing I will notify you of your test results Use the lidex cream twice a day Call if the rash does not go away in a couple of weeks  See me in 3 months Call sooner for problems    Raylene Everts, MD

## 2017-05-07 ENCOUNTER — Ambulatory Visit: Payer: BC Managed Care – PPO | Admitting: Family Medicine

## 2017-05-07 ENCOUNTER — Telehealth: Payer: Self-pay | Admitting: Family Medicine

## 2017-05-07 ENCOUNTER — Ambulatory Visit (HOSPITAL_COMMUNITY)
Admission: RE | Admit: 2017-05-07 | Discharge: 2017-05-07 | Disposition: A | Discharge status: Home / Self Care | Payer: BC Managed Care – PPO | Source: Ambulatory Visit | Attending: Family Medicine | Admitting: Family Medicine

## 2017-05-07 ENCOUNTER — Encounter: Payer: Self-pay | Admitting: Family Medicine

## 2017-05-07 VITALS — BP 118/76 | HR 76 | Temp 98.4°F | Resp 16 | Ht 70.0 in | Wt 246.1 lb

## 2017-05-07 DIAGNOSIS — R0781 Pleurodynia: Secondary | ICD-10-CM | POA: Insufficient documentation

## 2017-05-07 MED ORDER — PREDNISONE 20 MG PO TABS: 20.0000 mg | ORAL_TABLET | Freq: Every day | ORAL | 0 refills | Status: DC

## 2017-05-07 NOTE — Telephone Encounter (Signed)
appt made

## 2017-05-07 NOTE — Telephone Encounter (Signed)
Can see her at 4 today

## 2017-05-07 NOTE — Telephone Encounter (Signed)
Patient states she has pain in her right lung and the last time she felt this way she has pneumonia. She is scheduled for Thursday already, there is nothing sooner. Is this okay? Cb#: [phone]

## 2017-05-07 NOTE — Progress Notes (Signed)
Chief Complaint  Patient presents with  . URI    x 3 days   Patient is here for an acute visit.  She states that she woke up yesterday with a pain in her left side.  Hurts to cough, hurts to take a deep breath.  She has had no recent upper respiratory infection, no sputum, no runny nose or sore throat.  She has had no fever or chills.  No nausea or change in her appetite.  She feels mildly short of breath, just because it hurts to take a deep breath.  She does have a history of 2 pulmonary embolus in the past.  This is not nearly as painful as her PE.  She also has a history of leukemia.  She is on long-term Xarelto.  She has not missed any doses of her blood thinner.  She has not been exposed to anybody who is sick.  Patient Active Problem List   Diagnosis Date Noted  . Uterine fibroid 02/06/2017  . Environmental allergies 02/06/2017  . Adult BMI 35.0-35.9 kg/sq m 02/06/2017  . 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[place]) 09/16/2015  . History of pulmonary embolus (PE) 01/05/2013    Outpatient Encounter Medications as of 05/07/2017  Medication Sig  . acetaminophen (TYLENOL) 500 MG tablet Take 1,000 mg by mouth every 6 (six) hours as needed for moderate pain.  . Cetirizine HCl (ZYRTEC PO) Take 1 tablet by mouth daily.  Marland Kitchen etonogestrel (NEXPLANON) 68 MG IMPL implant 68 mg.  . fluocinonide cream (LIDEX) 0.27 % Apply 1 application topically 2 (two) times daily.  Alveda Reasons 20 MG TABS tablet Take 20 mg by mouth daily with supper.   . predniSONE (DELTASONE) 20 MG tablet Take 1 tablet (20 mg total) daily with breakfast by mouth.   No facility-administered encounter medications on file as of 05/07/2017.     No Known Allergies  Review of Systems  Constitutional: Negative for activity change, appetite change, chills, fatigue and fever.  HENT: Negative for congestion, sinus pressure and sinus pain.        No symptoms other than usual allergies  Eyes: Negative for redness and visual disturbance.    Respiratory: Negative for cough, chest tightness and shortness of breath.   Cardiovascular: Positive for chest pain.       Left rib pain with deep breath or cough  Gastrointestinal: Negative for abdominal pain, constipation and diarrhea.  Genitourinary: Negative for dysuria and flank pain.  Neurological: Negative for dizziness and headaches.   BP 118/76 (BP Location: Right Arm, Patient Position: Sitting, Cuff Size: Large)   Pulse 76   Temp 98.4 F (36.9 C) (Temporal)   Resp 16   Ht 5\' 10"  (1.778 m)   Wt 246 lb 1.9 oz (111.6 kg)   LMP 04/17/2017   SpO2 100%   BMI 35.31 kg/m   Physical Exam  Constitutional: She is oriented to person, place, and time. She appears well-developed and well-nourished.  HENT:  Head: Normocephalic and atraumatic.  Right Ear: External ear normal.  Left Ear: External ear normal.  Nose: Nose normal.  Mouth/Throat: Oropharynx is clear and moist.  Eyes: Conjunctivae are normal. Pupils are equal, round, and reactive to light.  Neck: Normal range of motion. Neck supple. No thyromegaly present.  Cardiovascular: Normal rate, regular rhythm and normal heart sounds.  Pulmonary/Chest: Effort normal and breath sounds normal. No respiratory distress.  Lungs are clear throughout.  No pain with pressure on chest wall or ribs.  Abdominal:  Soft. Bowel sounds are normal.  Musculoskeletal: Normal range of motion. She exhibits no edema.  Lymphadenopathy:    She has no cervical adenopathy.  Neurological: She is alert and oriented to person, place, and time.  Gait normal  Skin: Skin is warm and dry.  Psychiatric: She has a normal mood and affect. Her behavior is normal. Thought content normal.  Nursing note and vitals reviewed. Chest x-ray is normal  ASSESSMENT/PLAN:  1. Pleuritic chest pain  - DG Chest 2 View; Future   Patient Instructions  NO IBUPROFEN on the Xarelto I prescribed prednisone for a few days  Get chest x ray I will call you with  result  Addendum: Chest x-ray is normal 5:17 PM.  Call patient's cell phone and left a message. Raylene Everts, MD

## 2017-05-07 NOTE — Patient Instructions (Signed)
NO IBUPROFEN on the Xarelto I prescribed prednisone for a few days  Get chest x ray I will call you with result

## 2017-05-09 ENCOUNTER — Ambulatory Visit: Payer: BC Managed Care – PPO | Admitting: Family Medicine

## 2017-06-06 ENCOUNTER — Other Ambulatory Visit: Payer: Self-pay

## 2017-06-06 ENCOUNTER — Ambulatory Visit: Payer: BC Managed Care – PPO | Admitting: Family Medicine

## 2017-06-06 ENCOUNTER — Encounter: Payer: Self-pay | Admitting: Family Medicine

## 2017-06-06 ENCOUNTER — Telehealth: Payer: Self-pay | Admitting: *Deleted

## 2017-06-06 VITALS — BP 116/70 | HR 72 | Temp 98.8°F | Resp 20 | Ht 70.0 in | Wt 243.0 lb

## 2017-06-06 DIAGNOSIS — J069 Acute upper respiratory infection, unspecified: Secondary | ICD-10-CM

## 2017-06-06 DIAGNOSIS — B9789 Other viral agents as the cause of diseases classified elsewhere: Secondary | ICD-10-CM

## 2017-06-06 MED ORDER — BENZONATATE 100 MG PO CAPS: 100.0000 mg | ORAL_CAPSULE | Freq: Two times a day (BID) | ORAL | 0 refills | Status: DC | PRN

## 2017-06-06 MED ORDER — DM-GUAIFENESIN ER 30-600 MG PO TB12: 1.0000 | ORAL_TABLET | Freq: Two times a day (BID) | ORAL | 0 refills | Status: DC

## 2017-06-06 MED ORDER — AZITHROMYCIN 250 MG PO TABS: ORAL_TABLET | ORAL | 0 refills | Status: DC

## 2017-06-06 NOTE — Telephone Encounter (Signed)
Patient called stating she is still sick and I made patient aware I will talk to [facility] and see when we can get her worked in. [phone]  Patient was last seen 11/6

## 2017-06-06 NOTE — Patient Instructions (Signed)
Rest  push fluids Take the z pak as instructed Take the tessalon pills 2-3 times a day Take the "DM" products in addition ( like mucinex ) Call if not better in a few days

## 2017-06-06 NOTE — Telephone Encounter (Signed)
Spoke to pt appt made for 1540 today.

## 2017-06-06 NOTE — Progress Notes (Signed)
Chief Complaint  Patient presents with  . URI    x 2 weeks  Patient is here for an upper respiratory infection.  She is a Pharmacist, hospital in the high school.  She was exposed to a number of illnesses.  Normally she tries to treat these at home, but after 2 weeks of over-the-counter medication she continues to have cough, fatigue, sweats, and feeling like her infection is not clearing.  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  She has remained in remission for the last year.  She did have pneumonia in January 2018.  She states that she is starting to have some feelings of shortness of breath, and wants her lungs checked.  She does have some sinus pressure and pain.  No ear pain.  No headache.  No dizziness.  No syncope or presyncope. She remains on Xarelto for history of pulmonary embolus  Patient Active Problem List   Diagnosis Date Noted  . Uterine fibroid 02/06/2017  . Environmental allergies 02/06/2017  . Adult BMI 35.0-35.9 kg/sq m 02/06/2017  . 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 History of pulmonary embolus (PE) 01/05/2013    Outpatient Encounter Medications as of 06/06/2017  Medication Sig  . acetaminophen (TYLENOL) 500 MG tablet Take 1,000 mg by mouth every 6 (six) hours as needed for moderate pain.  . Cetirizine HCl (ZYRTEC PO) Take 1 tablet by mouth daily.  Marland Kitchen etonogestrel (NEXPLANON) 68 MG IMPL implant 68 mg.  . fluocinonide cream (LIDEX) 4.25 % Apply 1 application topically 2 (two) times daily.  Alveda Reasons 20 MG TABS tablet Take 20 mg by mouth daily with supper.   Marland Kitchen azithromycin (ZITHROMAX) 250 MG tablet Tad  . benzonatate (TESSALON) 100 MG capsule Take 1 capsule (100 mg total) by mouth 2 (two) times daily as needed for cough.  . dextromethorphan-guaiFENesin (MUCINEX DM) 30-600 MG 12hr tablet Take 1 tablet by mouth 2 (two) times daily.   No facility-administered encounter medications on file as of 06/06/2017.     No  Known Allergies  Review of Systems  Constitutional: Positive for chills and fatigue. Negative for activity change and fever.  HENT: Positive for congestion, postnasal drip, rhinorrhea and sinus pressure. Negative for sore throat and voice change.   Eyes: Negative for redness and visual disturbance.  Respiratory: Positive for cough and shortness of breath. Negative for wheezing.   Cardiovascular: Negative for chest pain, palpitations and leg swelling.  Gastrointestinal: Negative for diarrhea, nausea and vomiting.  Skin: Negative for rash.  Neurological: Negative for light-headedness and headaches.  Psychiatric/Behavioral: Positive for sleep disturbance.    BP 116/70 (BP Location: Right Arm, Patient Position: Sitting, Cuff Size: Large)   Pulse 72   Temp 98.8 F (37.1 C) (Temporal)   Resp 20   Ht 5\' 10"  (1.778 m)   Wt 243 lb 0.6 oz (110.2 kg)   SpO2 99%   BMI 34.87 kg/m   Physical Exam  Constitutional: She is oriented to person, place, and time. She appears well-developed and well-nourished.  Appears tired.  Harsh cough  HENT:  Head: Normocephalic and atraumatic.  Right Ear: External ear normal.  Left Ear: External ear normal.  Nose: Nose normal.  Mouth/Throat: Oropharynx is clear and moist.  Eyes: Conjunctivae are normal. Pupils are equal, round, and reactive to light.  Neck: Normal range of motion.  Cardiovascular: Normal rate, regular rhythm and normal heart sounds.  Pulmonary/Chest: Effort normal and  breath sounds normal. She has no wheezes.  Few central rhonchi.  No rales  Abdominal: Soft. Bowel sounds are normal.  No organomegaly  Lymphadenopathy:    She has no cervical adenopathy.  Neurological: She is alert and oriented to person, place, and time.  Psychiatric: She has a normal mood and affect. Her behavior is normal.    ASSESSMENT/PLAN:  1. Viral URI with cough We discussed that most of these respiratory illnesses or viruses.  She has been sick for 2 weeks,  history of leukemia, and a history of pneumonia and pulmonary embolus.  She may be somewhat compromised.  I feel at this point is worthwhile to give her a course of azithromycin.  I did not promise that it would help, as it will not cure viruses.  I did talk about symptomatic care, cough suppression, fluids, and rest.  She will call if she has not better by next week.   Patient Instructions  Rest  push fluids Take the z pak as instructed Take the tessalon pills 2-3 times a day Take the "DM" products in addition ( like mucinex ) Call if not better in a few days    Raylene Everts, MD

## 2017-12-06 ENCOUNTER — Encounter: Payer: Self-pay | Admitting: Family Medicine

## 2018-03-10 ENCOUNTER — Ambulatory Visit: Payer: BC Managed Care – PPO | Admitting: Obstetrics and Gynecology

## 2018-03-10 ENCOUNTER — Encounter (INDEPENDENT_AMBULATORY_CARE_PROVIDER_SITE_OTHER): Payer: Self-pay

## 2018-03-10 ENCOUNTER — Other Ambulatory Visit (HOSPITAL_COMMUNITY)
Admission: RE | Admit: 2018-03-10 | Discharge: 2018-03-10 | Disposition: A | Discharge status: Home / Self Care | Payer: BC Managed Care – PPO | Source: Ambulatory Visit | Attending: Obstetrics and Gynecology | Admitting: Obstetrics and Gynecology

## 2018-03-10 ENCOUNTER — Encounter: Payer: Self-pay | Admitting: Obstetrics and Gynecology

## 2018-03-10 VITALS — BP 124/79 | HR 74 | Ht 70.5 in | Wt 245.8 lb

## 2018-03-10 DIAGNOSIS — Z01419 Encounter for gynecological examination (general) (routine) without abnormal findings: Secondary | ICD-10-CM | POA: Diagnosis not present

## 2018-03-10 NOTE — Progress Notes (Signed)
Patient ID: Denise Khan, female   DOB: September 06, 1985, 32 y.o.   MRN: [id number]   Assessment:  Annual Gyn Exam Hx Pulm embolus on xarelto Contraception: Nexplanon Plan:  1. pap smear done, next pap due 3 years 2. Considering conception, to discuss further before removing nexplanon Subjective:  Denise Khan is a 32 y.o. female G1P1001 who presents for annual exam. Patient's last menstrual period was 02/24/2018 (approximate). She has leaking and is unsure where is it coming from and can't tell if it is urine has been on and off for about a year. There is no color or odor. She doesn't feel it happen but when she goes to the bathroom her underwear is always wet. Sometimes to the point of having to change underwear. Discharge is worse in the morning if she showers the night before, than if she waits until the morning to shower.  In 2017 was diagnosed with leukemia, went through chemo finished in August 2017 and discharge started shortly after she finished chemo. 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 Has had 1 child vaginally. She is thinking about having another child, after removing her Nexplanon.  The following portions of the patient's history were reviewed and updated as appropriate: allergies, current medications, past family history, past medical history, past social history, past surgical history and problem list. Past Medical History:  Diagnosis Date  . Anxiety   . Blood transfusion without reported diagnosis   . Cancer ([place])   . Clotting disorder ([place])   . PE (pulmonary embolism)     Past Surgical History:  Procedure Laterality Date  . KNEE SURGERY    . PORT-A-CATH REMOVAL       Current Outpatient Medications:  .  benzonatate (TESSALON) 100 MG capsule, Take 1 capsule (100 mg total) by mouth 2 (two) times daily as needed for cough., Disp: 20 capsule, Rfl: 0 .  Cetirizine HCl (ZYRTEC PO), Take 1  tablet by mouth daily., Disp: , Rfl:  .  etonogestrel (NEXPLANON) 68 MG IMPL implant, 68 mg., Disp: , Rfl:  .  XARELTO 20 MG TABS tablet, Take 20 mg by mouth daily with supper. , Disp: , Rfl:   Review of Systems Constitutional: negative Gastrointestinal: negative Genitourinary: clear watery oderless discharge  Objective:  BP 124/79 (BP Location: Right Arm, Patient Position: Sitting, Cuff Size: Large)   Pulse 74   Ht 5' 10.5" (1.791 m)   Wt 245 lb 12.8 oz (111.5 kg)   LMP 02/24/2018 (Approximate)   BMI 34.77 kg/m    BMI: Body mass index is 34.77 kg/m.  General Appearance: Alert, appropriate appearance for age. No acute distress HEENT: Grossly normal Neck / Thyroid:  Cardiovascular: RRR; normal S1, S2, no murmur Lungs: CTA bilaterally Back: No CVAT Breast Exam: Checks her breast after her cycle. Tissues even Gastrointestinal: Soft, non-tender, no masses or organomegaly Pelvic Exam:  VAGINA: normal appearing vagina, no lesions CERVIX:, multip, well supported, anterior UTERUS: retroverted PAP: Pap smear done today, GC/CHL Rectal exam: stool guaiac negative. Lymphatic Exam: Non-palpable nodes in neck, clavicular, axillary, or inguinal regions  Skin: no rash or abnormalities Neurologic: Normal gait and speech, no tremor  Psychiatric: Alert and oriented, appropriate affect.  Urinalysis:Not done   By signing my name below, I, Samul Dada, attest that this documentation has been prepared under the direction and in the presence of Jonnie Kind, MD. Electronically Signed: Samul Dada  Medical Scribe. 03/10/18. 3:27 PM.  I personally performed the services described in this documentation, which was SCRIBED in my presence. The recorded information has been reviewed and considered accurate. It has been edited as necessary during review. Jonnie Kind, MD

## 2018-03-13 LAB — CYTOLOGY - PAP
Chlamydia: NEGATIVE
Diagnosis: NEGATIVE
HPV (WINDOPATH): NOT DETECTED
Neisseria Gonorrhea: NEGATIVE

## 2018-03-19 ENCOUNTER — Telehealth: Payer: Self-pay | Admitting: *Deleted

## 2018-03-19 NOTE — Telephone Encounter (Signed)
DOB verified. Informed pt that PAP smear was normal

## 2018-07-01 ENCOUNTER — Encounter: Payer: Self-pay | Admitting: Obstetrics and Gynecology

## 2018-07-01 ENCOUNTER — Ambulatory Visit (INDEPENDENT_AMBULATORY_CARE_PROVIDER_SITE_OTHER): Payer: BC Managed Care – PPO | Admitting: Obstetrics and Gynecology

## 2018-07-01 VITALS — BP 125/85 | HR 61 | Ht 70.0 in | Wt 232.4 lb

## 2018-07-01 DIAGNOSIS — Z3049 Encounter for surveillance of other contraceptives: Secondary | ICD-10-CM

## 2018-07-01 DIAGNOSIS — Z3046 Encounter for surveillance of implantable subdermal contraceptive: Secondary | ICD-10-CM

## 2018-07-01 MED ORDER — NORETHINDRONE 0.35 MG PO TABS
1.0000 | ORAL_TABLET | Freq: Every day | ORAL | 4 refills | Status: DC
Start: 2018-07-01 — End: 2019-10-28

## 2018-07-01 NOTE — Progress Notes (Signed)
Patient ID: Denise Khan, female   DOB: 1986/03/15, 32 y.o.   MRN: [id number]   [place] PROCEDURE NOTE  Denise Khan is a 32 y.o. G1P1001 here for Nexplanon removal  .  No other gynecologic concerns. She had a pulmonary embolism and is on xarelto  Nexplanon Removal Patient identified, informed consent performed, consent signed.   Appropriate time out taken. Nexplanon site identified.  Area prepped in usual sterile fashon. One ml of 1% lidocaine was used to anesthetize the area at the distal end of the implant. A small stab incision was made  beside the tip of the implant on the distal portion. The Nexplanon rod was grasped using hemostats, once the tip could be exposed and pushed through the skin incision and removed without difficulty.  There was minimal blood loss. There were no complications.  3 ml of 1% lidocaine was injected around the incision for post-procedure analgesia.  Steri-strips were applied over the small incision.  A pressure bandage was applied to reduce any bruising.  The patient tolerated the procedure well and was given post procedure instructions.  Patient is planning to use progesterone only oral birth control for contraception/attempt conception.  Discussion: 1. Discussed with pt risks and benefits of oral contraceptive Micronoir  At end of discussion, pt had opportunity to ask questions and has no further questions at this time.   Specific discussion of Micronoir as noted above. Greater than 50% was spent in counseling and coordination of care with the patient.   Total time greater than: 15 minutes.   P:  1. Begin Rx Micronoir 35 mg daily 2. F/u in 3 months for assessment  By signing my name below, I, Samul Dada, attest that this documentation has been prepared under the direction and in the presence of Jonnie Kind, MD. Electronically Signed: [place]. 07/01/18. 9:59 AM.  I personally performed the services described in this  documentation, which was SCRIBED in my presence. The recorded information has been reviewed and considered accurate. It has been edited as necessary during review. Jonnie Kind, MD

## 2019-05-04 ENCOUNTER — Emergency Department (HOSPITAL_COMMUNITY)
Admission: EM | Admit: 2019-05-04 | Discharge: 2019-05-04 | Disposition: A | Discharge status: Home / Self Care | Payer: BC Managed Care – PPO | Attending: Emergency Medicine | Admitting: Emergency Medicine

## 2019-05-04 ENCOUNTER — Emergency Department (HOSPITAL_COMMUNITY): Payer: BC Managed Care – PPO

## 2019-05-04 ENCOUNTER — Inpatient Hospital Stay
Admission: RE | Admit: 2019-05-04 | Discharge: 2019-05-04 | Disposition: A | Discharge status: Home / Self Care | Payer: BC Managed Care – PPO | Source: Ambulatory Visit

## 2019-05-04 ENCOUNTER — Telehealth: Payer: BC Managed Care – PPO

## 2019-05-04 ENCOUNTER — Ambulatory Visit
Admission: EM | Admit: 2019-05-04 | Discharge: 2019-05-04 | Disposition: A | Discharge status: Home / Self Care | Payer: BC Managed Care – PPO | Source: Home / Self Care

## 2019-05-04 ENCOUNTER — Other Ambulatory Visit: Payer: Self-pay

## 2019-05-04 ENCOUNTER — Encounter (HOSPITAL_COMMUNITY): Payer: Self-pay | Admitting: Emergency Medicine

## 2019-05-04 DIAGNOSIS — Z79899 Other long term (current) drug therapy: Secondary | ICD-10-CM | POA: Diagnosis not present

## 2019-05-04 DIAGNOSIS — R0789 Other chest pain: Secondary | ICD-10-CM | POA: Diagnosis present

## 2019-05-04 DIAGNOSIS — Z7901 Long term (current) use of anticoagulants: Secondary | ICD-10-CM | POA: Insufficient documentation

## 2019-05-04 DIAGNOSIS — R091 Pleurisy: Secondary | ICD-10-CM | POA: Diagnosis not present

## 2019-05-04 DIAGNOSIS — C9200 Acute myeloblastic leukemia, not having achieved remission: Secondary | ICD-10-CM | POA: Insufficient documentation

## 2019-05-04 DIAGNOSIS — R0602 Shortness of breath: Secondary | ICD-10-CM | POA: Insufficient documentation

## 2019-05-04 DIAGNOSIS — I2699 Other pulmonary embolism without acute cor pulmonale: Secondary | ICD-10-CM | POA: Insufficient documentation

## 2019-05-04 LAB — CBC WITH DIFFERENTIAL/PLATELET
Abs Immature Granulocytes: 0.01 10*3/uL (ref 0.00–0.07)
Basophils Absolute: 0 10*3/uL (ref 0.0–0.1)
Basophils Relative: 0 %
Eosinophils Absolute: 0 10*3/uL (ref 0.0–0.5)
Eosinophils Relative: 0 %
HCT: 38.2 % (ref 36.0–46.0)
Hemoglobin: 12.7 g/dL (ref 12.0–15.0)
Immature Granulocytes: 0 %
Lymphocytes Relative: 32 %
Lymphs Abs: 1.9 10*3/uL (ref 0.7–4.0)
MCH: 33.4 pg (ref 26.0–34.0)
MCHC: 33.2 g/dL (ref 30.0–36.0)
MCV: 100.5 fL — ABNORMAL HIGH (ref 80.0–100.0)
Monocytes Absolute: 0.5 10*3/uL (ref 0.1–1.0)
Monocytes Relative: 9 %
Neutro Abs: 3.5 10*3/uL (ref 1.7–7.7)
Neutrophils Relative %: 59 %
Platelets: 240 10*3/uL (ref 150–400)
RBC: 3.8 MIL/uL — ABNORMAL LOW (ref 3.87–5.11)
RDW: 12 % (ref 11.5–15.5)
WBC: 6 10*3/uL (ref 4.0–10.5)
nRBC: 0 % (ref 0.0–0.2)

## 2019-05-04 LAB — COMPREHENSIVE METABOLIC PANEL
ALT: 17 U/L (ref 0–44)
AST: 18 U/L (ref 15–41)
Albumin: 4.4 g/dL (ref 3.5–5.0)
Alkaline Phosphatase: 44 U/L (ref 38–126)
Anion gap: 6 (ref 5–15)
BUN: 12 mg/dL (ref 6–20)
CO2: 23 mmol/L (ref 22–32)
Calcium: 9.2 mg/dL (ref 8.9–10.3)
Chloride: 109 mmol/L (ref 98–111)
Creatinine, Ser: 0.87 mg/dL (ref 0.44–1.00)
GFR calc Af Amer: >60 mL/min (ref >60)
GFR calc non Af Amer: >60 mL/min (ref >60)
Glucose, Bld: 98 mg/dL (ref 70–99)
Potassium: 4.1 mmol/L (ref 3.5–5.1)
Sodium: 138 mmol/L (ref 135–145)
Total Bilirubin: 0.5 mg/dL (ref 0.3–1.2)
Total Protein: 7.5 g/dL (ref 6.5–8.1)

## 2019-05-04 LAB — TROPONIN I (HIGH SENSITIVITY)
Troponin I (High Sensitivity): <2 ng/L (ref <18)
Troponin I (High Sensitivity): <2 ng/L (ref <18)

## 2019-05-04 LAB — PROTIME-INR
INR: 1.2 (ref 0.8–1.2)
Prothrombin Time: 14.7 seconds (ref 11.4–15.2)

## 2019-05-04 LAB — APTT: aPTT: 29 seconds (ref 24–36)

## 2019-05-04 MED ORDER — MELOXICAM 7.5 MG PO TABS: 7.5000 mg | ORAL_TABLET | Freq: Two times a day (BID) | ORAL | 0 refills | Status: AC | PRN

## 2019-05-04 MED ORDER — IOHEXOL 350 MG/ML SOLN
100.0000 mL | Freq: Once | INTRAVENOUS | Status: AC | PRN
  Administered 2019-05-04: 100 mL via INTRAVENOUS

## 2019-05-04 NOTE — ED Triage Notes (Signed)
Pt presents with c/o CP that pt   has had increasing for past week, pt has H/O PE and is on blood thinners, pt also has co SOB. Pt states it feels similar to PE that she experienced 2018. VS WNL, respirations are unlabored. It is recommended that pt go to ED for further evaluation

## 2019-05-04 NOTE — Discharge Instructions (Signed)
Please continue all of your medications as prescribed by your doctor.  Your CT scan was normal and showed no signs of pulmonary embolism or blood clot!  You may take Mobic up to twice a day as needed for pain  Seek a medical exam for worsening or severe symptoms

## 2019-05-04 NOTE — ED Provider Notes (Signed)
[facility] EMERGENCY DEPARTMENT Provider Note   CSN: [id number] Arrival date & time: 05/04/19  1442     History   Chief Complaint Chief Complaint  Patient presents with  . Chest Pain    HPI Denise Khan is a 33 y.o. female.     HPI  This patient is a 33 year old female, she presents to the hospital today from the urgent care where she was sent because of having increasing shortness of breath for the last week.  She has a history of blood clots, she is taking Xarelto, she has been having sharp intermittent pains bilateral chest without coughing or significant swelling of the legs.  After developing blood clots in 2014 she was subsequently diagnosed with a second blood clot and has been on that medication since.  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  She is unsure exactly why she had the blood clots dating back to 2014.  Symptoms have been persistent, gradually worsening, pain increases with deep breathing.  No fever, no cough  Past Medical History:  Diagnosis Date  . Anxiety   . Blood transfusion without reported diagnosis   . Cancer ([place])   . Clotting disorder ([place])   . PE (pulmonary embolism)     Patient Active Problem List   Diagnosis Date Noted  . Uterine fibroid 02/06/2017  . Environmental allergies 02/06/2017  . Adult BMI 35.0-35.9 kg/sq m 02/06/2017  . 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 History of pulmonary embolus (PE) 01/05/2013    Past Surgical History:  Procedure Laterality Date  . KNEE SURGERY    . PORT-A-CATH REMOVAL       OB History    Gravida  1   Para  1   Term  1   Preterm      AB      Living  1     SAB      TAB      Ectopic      Multiple      Live Births  1            Home Medications    Prior to Admission medications   Medication Sig Start Date End Date Taking? Authorizing Provider  cetirizine (ZYRTEC) 10 MG tablet Take 10 mg  by mouth daily.   Yes [provider]  Lactobacillus Rhamnosus, GG, (CULTURELLE IMMUNITY SUPPORT) CAPS Take 2 tablets by mouth daily.   Yes [provider]  norethindrone (MICRONOR,CAMILA,ERRIN) 0.35 MG tablet Take 1 tablet (0.35 mg total) by mouth daily. 07/01/18  Yes Jonnie Kind, MD  XARELTO 20 MG TABS tablet Take 20 mg by mouth daily with supper.  12/13/16  Yes [provider]  meloxicam (MOBIC) 7.5 MG tablet Take 1 tablet (7.5 mg total) by mouth 2 (two) times daily as needed for up to 14 days for pain. 05/04/19 05/18/19  Noemi Chapel, MD    Family History Family History  Problem Relation Age of Onset  . CAD Father   . Hypertension Father   . Heart disease Father   . Hypertension Mother   . Cancer Maternal Grandmother        lung  . Stroke Maternal Grandfather   . Hypertension Maternal Grandfather   . Kidney disease Maternal Grandfather     Social History Social History   Tobacco Use  . Smoking status: Never Smoker  . Smokeless tobacco: Never Used  Substance Use Topics  .  Alcohol use: No  . Drug use: No     Allergies   Patient has no known allergies.   Review of Systems Review of Systems  All other systems reviewed and are negative.    Physical Exam Updated Vital Signs BP 110/72   Pulse 83   Temp 98.2 F (36.8 C) (Oral)   Resp 15   Ht 1.778 m (5\' 10" )   Wt 95.3 kg   SpO2 100%   BMI 30.13 kg/m   Physical Exam Vitals signs and nursing note reviewed.  Constitutional:      General: She is not in acute distress.    Appearance: She is well-developed.  HENT:     Head: Normocephalic and atraumatic.     Mouth/Throat:     Pharynx: No oropharyngeal exudate.  Eyes:     General: No scleral icterus.       Right eye: No discharge.        Left eye: No discharge.     Conjunctiva/sclera: Conjunctivae normal.     Pupils: Pupils are equal, round, and reactive to light.  Neck:     Musculoskeletal: Normal range of motion and neck  supple.     Thyroid: No thyromegaly.     Vascular: No JVD.  Cardiovascular:     Rate and Rhythm: Normal rate and regular rhythm.     Heart sounds: Normal heart sounds. No murmur. No friction rub. No gallop.   Pulmonary:     Effort: Pulmonary effort is normal. No respiratory distress.     Breath sounds: Normal breath sounds. No wheezing or rales.  Abdominal:     General: Bowel sounds are normal. There is no distension.     Palpations: Abdomen is soft. There is no mass.     Tenderness: There is no abdominal tenderness.  Musculoskeletal: Normal range of motion.        General: No tenderness.  Lymphadenopathy:     Cervical: No cervical adenopathy.  Skin:    General: Skin is warm and dry.     Findings: No erythema or rash.  Neurological:     Mental Status: She is alert.     Coordination: Coordination normal.  Psychiatric:        Behavior: Behavior normal.      ED Treatments / Results  Labs (all labs ordered are listed, but only abnormal results are displayed) Labs Reviewed  CBC WITH DIFFERENTIAL/PLATELET - Abnormal; Notable for the following components:      Result Value   RBC 3.80 (*)    MCV 100.5 (*)    All other components within normal limits  COMPREHENSIVE METABOLIC PANEL  PROTIME-INR  APTT  TROPONIN I (HIGH SENSITIVITY)  TROPONIN I (HIGH SENSITIVITY)    EKG EKG Interpretation  Date/Time:  Monday May 04 2019 15:13:00 EST Ventricular Rate:  80 PR Interval:  150 QRS Duration: 82 QT Interval:  392 QTC Calculation: 452 R Axis:   61 Text Interpretation: Normal sinus rhythm with sinus arrhythmia Normal ECG Normal ECG Confirmed by Noemi Chapel [phone]) on 05/04/2019 3:20:26 PM   Radiology Dg Chest 2 View  Result Date: 05/04/2019 CLINICAL DATA:  Shortness of breath for 4-5 days, history of pulmonary embolism, pneumonia EXAM: CHEST - 2 VIEW COMPARISON:  05/07/2017 FINDINGS: Normal heart size, mediastinal contours, and pulmonary vascularity. Lungs clear. No  infiltrate, pleural effusion or pneumothorax. Osseous structures unremarkable. IMPRESSION: No acute abnormalities. Electronically Signed   By: Lavonia Dana M.D.   On: 05/04/2019 15:50  Ct Angio Chest Pe W And/or Wo Contrast  Result Date: 05/04/2019 CLINICAL DATA:  Patient states left-sided chest pain underneath the left breast. Ongoing for 1 week EXAM: CT ANGIOGRAPHY CHEST WITH CONTRAST TECHNIQUE: Multidetector CT imaging of the chest was performed using the standard protocol during bolus administration of intravenous contrast. Multiplanar CT image reconstructions and MIPs were obtained to evaluate the vascular anatomy. CONTRAST:  115mL OMNIPAQUE IOHEXOL 350 MG/ML SOLN COMPARISON:  None. FINDINGS: Cardiovascular: Satisfactory opacification of the pulmonary arteries to the segmental level. No evidence of pulmonary embolism. Normal heart size. No pericardial effusion. Mediastinum/Nodes: No enlarged mediastinal, hilar, or axillary lymph nodes. Thyroid gland, trachea, and esophagus demonstrate no significant findings. Lungs/Pleura: Lungs are clear. No pleural effusion or pneumothorax. Upper Abdomen: No acute abnormality. Musculoskeletal: No chest wall abnormality. No acute or significant osseous findings. Review of the MIP images confirms the above findings. IMPRESSION: 1. No evidence of a pulmonary embolus. 2. No acute cardiopulmonary disease. Electronically Signed   By: Kathreen Devoid   On: 05/04/2019 17:59    Procedures Procedures (including critical care time)  Medications Ordered in ED Medications  iohexol (OMNIPAQUE) 350 MG/ML injection 100 mL (100 mLs Intravenous Contrast Given 05/04/19 1740)     Initial Impression / Assessment and Plan / ED Course  I have reviewed the triage vital signs and the nursing notes.  Pertinent labs & imaging results that were available during my care of the patient were reviewed by me and considered in my medical decision making (see chart for details).        The  patient's exam is very well-appearing, she has a normal chest x-ray, normal EKG and her lab work thus far is unremarkable.  I suspect that she has pleuritic pain, she is already anticoagulated on Xarelto making her pretest probability for clot below however given her history of clot if she did in fact have one on Xarelto we would need to readdress her medications.  CT scan will be ordered, the patient is in agreement.  I have personally looked at the CT scan, I have agreed with the radiologist that there is no signs of acute pulmonary embolism.  The patient can be discharged safely, she is already anticoagulated, will add an NSAID.  Final Clinical Impressions(s) / ED Diagnoses   Final diagnoses:  Pleurisy    ED Discharge Orders         Ordered    meloxicam (MOBIC) 7.5 MG tablet  2 times daily PRN     05/04/19 1837           Noemi Chapel, MD 05/04/19 Bosie Helper

## 2019-05-04 NOTE — ED Notes (Signed)
Patient states left sided chest pain underneath left breast has been intermittent x 1 week. States it starts off gradually and then becomes sharp. States previous PE had same symptoms.

## 2019-05-04 NOTE — ED Triage Notes (Signed)
Pt was sent over from [facility] urgent care for chest pain and increased sob x 1 week.  HX of PE and on blood thinners.

## 2019-09-06 ENCOUNTER — Ambulatory Visit: Payer: BC Managed Care – PPO | Attending: Internal Medicine

## 2019-09-06 DIAGNOSIS — Z23 Encounter for immunization: Secondary | ICD-10-CM

## 2019-09-06 NOTE — Progress Notes (Signed)
   Covid-19 Vaccination Clinic  Name:  Denise Khan    MRN: [id number] DOB: 02/08/1986  09/06/2019  Denise Khan was observed post Covid-19 immunization for 15 minutes without incident. She was provided with Vaccine Information Sheet and instruction to access the V-Safe system.   Denise Khan was instructed to call 911 with any severe reactions post vaccine: Marland Kitchen Difficulty breathing  . Swelling of face and throat  . A fast heartbeat  . A bad rash all over body  . Dizziness and weakness   Immunizations Administered    Name Date Dose VIS Date Route   Pfizer COVID-19 Vaccine 09/06/2019  6:26 PM 0.3 mL 06/12/2019 Intramuscular   Manufacturer: [place]   Lot: [id number]   [place]: [id number]

## 2019-09-27 ENCOUNTER — Ambulatory Visit: Payer: BC Managed Care – PPO | Attending: Internal Medicine

## 2019-09-27 ENCOUNTER — Other Ambulatory Visit: Payer: Self-pay

## 2019-09-27 DIAGNOSIS — Z23 Encounter for immunization: Secondary | ICD-10-CM

## 2019-09-27 NOTE — Progress Notes (Signed)
   Covid-19 Vaccination Clinic  Name:  Denise Khan    MRN: [id number] DOB: [birth year]  09/27/2019  [name] was observed post Covid-19 immunization for 15 minutes without incident. She was provided with Vaccine Information Sheet and instruction to access the V-Safe system.   [name] was instructed to call 911 with any severe reactions post vaccine: Marland Kitchen Difficulty breathing  . Swelling of face and throat  . A fast heartbeat  . A bad rash all over body  . Dizziness and weakness   Immunizations Administered    Name Date Dose VIS Date Route   Pfizer COVID-19 Vaccine 09/27/2019  3:08 PM 0.3 mL 06/12/2019 Intramuscular   Manufacturer: [employer], [employer]   Lot: [id number]   [place]: [id number]

## 2019-09-28 ENCOUNTER — Ambulatory Visit
Admission: EM | Admit: 2019-09-28 | Discharge: 2019-09-28 | Disposition: A | Discharge status: Home / Self Care | Payer: BC Managed Care – PPO | Attending: Emergency Medicine | Admitting: Emergency Medicine

## 2019-09-28 ENCOUNTER — Other Ambulatory Visit: Payer: Self-pay

## 2019-09-28 ENCOUNTER — Ambulatory Visit (INDEPENDENT_AMBULATORY_CARE_PROVIDER_SITE_OTHER)
Admission: RE | Admit: 2019-09-28 | Discharge: 2019-09-28 | Disposition: A | Discharge status: Other Acute Inpatient Hospital | Payer: BC Managed Care – PPO | Source: Ambulatory Visit

## 2019-09-28 ENCOUNTER — Encounter: Payer: Self-pay | Admitting: Emergency Medicine

## 2019-09-28 DIAGNOSIS — R21 Rash and other nonspecific skin eruption: Secondary | ICD-10-CM

## 2019-09-28 DIAGNOSIS — K130 Diseases of lips: Secondary | ICD-10-CM

## 2019-09-28 DIAGNOSIS — L299 Pruritus, unspecified: Secondary | ICD-10-CM

## 2019-09-28 MED ORDER — PERMETHRIN 5 % EX CREA: 1.0000 "application " | TOPICAL_CREAM | Freq: Once | CUTANEOUS | 0 refills | Status: AC

## 2019-09-28 MED ORDER — HYDROXYZINE HCL 25 MG PO TABS: 12.5000 mg | ORAL_TABLET | Freq: Three times a day (TID) | ORAL | 0 refills | Status: DC | PRN

## 2019-09-28 MED ORDER — PREDNISONE 10 MG (21) PO TBPK: ORAL_TABLET | Freq: Every day | ORAL | 0 refills | Status: DC

## 2019-09-28 NOTE — ED Provider Notes (Signed)
Virtual Visit via Video Note:  Denise Khan  initiated request for Telemedicine visit with Denise Khan. I connected with Denise Khan  on 09/28/2019 at 11:52 AM  for a synchronized telemedicine visit using a video enabled HIPPA compliant telemedicine application. I verified that I am speaking with Denise Khan  using two identifiers. Denise Eagles, PA-C  was physically located in a [facility]gent care site and Denise Khan.   The limitations of evaluation and management by telemedicine as well as the availability of in-person appointments were discussed. Patient was informed that she  may incur a bill ( including co-pay) for this virtual visit encounter. Denise Khan  expressed understanding and gave verbal consent to proceed with virtual visit.     History of Present Illness:Denise Khan  is a 34 y.o. female presents with 3 day hx of acute onset persistent moderately itchy rash. Rash started on her hands, has painful bumps, was 1-2 bumps but progressed rapidly to many bumps, spread to her elbows on extensor surfaces bilaterally. Then she developed lip swelling and cracking. Has a few bumps on her thighs but nowhere near as bad as her hands. Tried benadryl and hydrocortisone cream without relief. Sx are only painful over her hands. Otherwise, has significant itching. She did have throat pain for 1 day that is now resolved. No other family members have this rash.  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  ROS  No current facility-administered medications for this encounter.   Current Outpatient Medications  Medication Sig Dispense Refill  . cetirizine (ZYRTEC) 10 MG tablet Take 10 mg by mouth daily.    . Lactobacillus Rhamnosus, GG, (CULTURELLE IMMUNITY SUPPORT) CAPS Take 2 tablets by mouth daily.    . norethindrone (MICRONOR,CAMILA,ERRIN) 0.35 MG tablet Take 1 tablet (0.35  mg total) by mouth daily. 3 Package 4  . XARELTO 20 MG TABS tablet Take 20 mg by mouth daily with supper.        No Known Allergies   Past Medical History:  Diagnosis Date  . Anxiety   . Blood transfusion without reported diagnosis   . Cancer ([place])   . Clotting disorder ([place])   . PE (pulmonary embolism)     Past Surgical History:  Procedure Laterality Date  . KNEE SURGERY    . PORT-A-CATH REMOVAL      Observations/Objective: Physical Exam Constitutional:      General: She is not in acute distress.    Appearance: Normal appearance. She is well-developed. She is not ill-appearing, toxic-appearing or diaphoretic.  HENT:     Head:   Eyes:     Extraocular Movements: Extraocular movements intact.  Pulmonary:     Effort: Pulmonary effort is normal.  Skin:    Findings: Rash (multiple solitary nodules that are difficult to visualize on video; scattered throughout 1-3rd fingers, similar lesions over extensor surfaces of bilateral forearms) present.  Neurological:     General: No focal deficit present.     Mental Status: She is alert and oriented to person, place, and time.  Psychiatric:        Mood and Affect: Mood normal.        Behavior: Behavior normal.        Thought Content: Thought content normal.        Judgment: Judgment normal.      Assessment and Plan:  1. Rash and  nonspecific skin eruption   2. Cheilitis    Rash difficult to visualize and characterize.  Counseled patient on differential which is extensive including possible contact dermatitis, dyshidrotic eczema, scabies, dermatitis due to bedbugs, viral exanthem (especially given sore throat).  She does have colitis of her lower lip.  Counseled patient on need for supportive care at the very least with hydroxyzine for itching.  Offered patient trial of ivermectin to address this rash as an atypical scabies rash and recommended she have her home check for bedbugs.  Patient prefers to be examined in person for  better evaluation however as I could not fully visualize her rash and is nonspecific.   Follow Up Instructions:    I discussed the assessment and treatment plan with the patient. The patient was provided an opportunity to ask questions and all were answered. The patient agreed with the plan and demonstrated an understanding of the instructions.   The patient was advised to call back or seek an in-person evaluation if the symptoms worsen or if the condition fails to improve as anticipated.  I provided 10 minutes of non-face-to-face time during this encounter.    Denise Eagles, PA-C  09/28/2019 11:52 AM         Denise Eagles, PA-C 09/28/19 1213

## 2019-09-28 NOTE — Discharge Instructions (Signed)
Please head to the [facility] clinic for better in person evaluation. In the meantime, you are welcome to use hydroxyzine for itching.

## 2019-09-28 NOTE — Discharge Instructions (Signed)
Wash with warm water and mild soap Avoid scratching as this may cause infection Prednisone prescribed. Take as directed and to completion for itching Use OTC antihistamine like zyrtec, claritin, or allegra as needed during the daytime itching relief Vistaril or hydroxyzine at night.  This medication can make you drowsy.  DO NOT take prior to driving or operating heavy machinery Permethrin cream prescribed.  Use as directed.   Follow up with PCP or dermatology if symptoms persists.  Dermatology referral ordered.   Return or go to the ER if you have any new or worsening symptoms such as fever, chills, nausea, vomiting, redness, swelling, discharge, if symptoms do not improve with medications, etc..Marland Kitchen

## 2019-09-28 NOTE — ED Triage Notes (Signed)
Rash, elbows with rash, not getting any better.  Rash itches.  No one else in the home with symptoms.

## 2019-09-28 NOTE — ED Provider Notes (Signed)
[facility]   [id number] 09/28/19 Arrival Time: 1229  CC: Rash  SUBJECTIVE:  Aamani Coffey Bigford is a 34 y.o. female who presents with a itchy rash to bilateral hands, elbows, and around lips x 3 days.  Denies precipitating event or trauma.  Denies changes in soaps, detergents, close contacts with similar rash, trigger, or allergy.  Denies medications change or starting a new medication recently.  Describes it as progressive, itchy, bumpy and red.  Has tried OTC medications like benadryl and hydrocortisone without relief. Worse towards the evening.  Denies similar symptoms in the past.   Denies fever, chills, nausea, vomiting, discharge.    ROS: As per HPI.  All other pertinent ROS negative.     Past Medical History:  Diagnosis Date  . Anxiety   . Blood transfusion without reported diagnosis   . Cancer ([place])   . Clotting disorder ([place])   . PE (pulmonary embolism)    Past Surgical History:  Procedure Laterality Date  . KNEE SURGERY    . PORT-A-CATH REMOVAL     No Known Allergies No current facility-administered medications on file prior to encounter.   Current Outpatient Medications on File Prior to Encounter  Medication Sig Dispense Refill  . cetirizine (ZYRTEC) 10 MG tablet Take 10 mg by mouth daily.    . Lactobacillus Rhamnosus, GG, (CULTURELLE IMMUNITY SUPPORT) CAPS Take 2 tablets by mouth daily.    . norethindrone (MICRONOR,CAMILA,ERRIN) 0.35 MG tablet Take 1 tablet (0.35 mg total) by mouth daily. 3 Package 4  . XARELTO 20 MG TABS tablet Take 20 mg by mouth daily with supper.     . hydrOXYzine (ATARAX/VISTARIL) 25 MG tablet Take 0.5-1 tablets (12.5-25 mg total) by mouth every 8 (eight) hours as needed for itching. 30 tablet 0   Social History   Socioeconomic History  . Marital status: Significant Other    Spouse name: Not on file  . Number of children: 1  . Years of education: 21  . Highest education level: Not on file  Occupational History  . Occupation:  Patent attorney    Comment: [employer]  Tobacco Use  . Smoking status: Never Smoker  . Smokeless tobacco: Never Used  Substance and Sexual Activity  . Alcohol use: No  . Drug use: No  . Sexual activity: Yes  Other Topics Concern  . Not on file  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 with 51 yo son Benevides   His dad is Jeneen Rinks, lives with them   Social Determinants of Health   Financial Resource Strain:   . Difficulty of Paying Living Expenses:   Food Insecurity:   . Worried About Charity fundraiser in the Last Year:   . Arboriculturist in the Last Year:   Transportation Needs:   . Film/video editor (Medical):   Marland Kitchen Lack of Transportation (Non-Medical):   Physical Activity:   . Days of Exercise per Week:   . Minutes of Exercise per Session:   Stress:   . Feeling of Stress :   Social Connections:   . Frequency of Communication with Friends and Family:   . Frequency of Social Gatherings with Friends and Family:   . Attends Religious Services:   . Active Member of Clubs or Organizations:   . Attends Archivist Meetings:   Marland Kitchen Marital Status:   Intimate Partner Violence:   . Fear of Current or Ex-Partner:   . Emotionally Abused:   .  Physically Abused:   . Sexually Abused:    Family History  Problem Relation Age of Onset  . CAD Father   . Hypertension Father   . Heart disease Father   . Hypertension Mother   . Cancer Maternal Grandmother        lung  . Stroke Maternal Grandfather   . Hypertension Maternal Grandfather   . Kidney disease Maternal Grandfather     OBJECTIVE: Vitals:   09/28/19 1240  BP: 121/83  Pulse: 86  Resp: 16  Temp: 99.7 F (37.6 C)  TempSrc: Oral  SpO2: 97%    General appearance: alert; no distress Head: NCAT Lungs: normal respiratory effort Extremities: no edema Skin: warm and dry; papular pustular rash with surrounding erythema diffuse about the bilateral hands (dorsal and palmar aspects) and flexor  surfaces of the elbows, NTTP, no obvious drainage or bleeding, some involvement of the interdigit web spaces (see pictures below) Psychological: alert and cooperative; normal mood and affect            ASSESSMENT & PLAN:  1. Rash and nonspecific skin eruption   2. Itching     Meds ordered this encounter  Medications  . permethrin (ELIMITE) 5 % cream    Sig: Apply 1 application topically once for 1 dose.    Dispense:  60 g    Refill:  0    Order Specific Question:   Supervising Provider    Answer:   Raylene Everts [id number]  . predniSONE (STERAPRED UNI-PAK 21 TAB) 10 MG (21) TBPK tablet    Sig: Take by mouth daily. Take 6 tabs by mouth daily  for 2 days, then 5 tabs for 2 days, then 4 tabs for 2 days, then 3 tabs for 2 days, 2 tabs for 2 days, then 1 tab by mouth daily for 2 days    Dispense:  42 tablet    Refill:  0    Order Specific Question:   Supervising Provider    Answer:   Raylene Everts [id number]    @[facility] @  Wash with warm water and mild soap Avoid scratching as this may cause infection Prednisone prescribed. Take as directed and to completion for itching Use OTC antihistamine like zyrtec, claritin, or allegra as needed during the daytime itching relief Vistaril or hydroxyzine at night.  This medication can make you drowsy.  DO NOT take prior to driving or operating heavy machinery Permethrin cream prescribed.  Use as directed.  Cover for scabies.   Follow up with PCP or dermatology if symptoms persists.  Dermatology referral ordered.   Return or go to the ER if you have any new or worsening symptoms such as fever, chills, nausea, vomiting, redness, swelling, discharge, if symptoms do not improve with medications, etc...  Reviewed expectations re: course of current medical issues. Questions answered. Outlined signs and symptoms indicating need for more acute intervention. Patient verbalized understanding. After Visit Summary given.   Lestine Box,  PA-C 09/28/19 1303

## 2019-10-28 ENCOUNTER — Other Ambulatory Visit (HOSPITAL_COMMUNITY)
Admission: RE | Admit: 2019-10-28 | Discharge: 2019-10-28 | Disposition: A | Discharge status: Home / Self Care | Payer: BC Managed Care – PPO | Source: Ambulatory Visit | Attending: Obstetrics & Gynecology | Admitting: Obstetrics & Gynecology

## 2019-10-28 ENCOUNTER — Encounter: Payer: Self-pay | Admitting: Women's Health

## 2019-10-28 ENCOUNTER — Other Ambulatory Visit: Payer: Self-pay

## 2019-10-28 ENCOUNTER — Ambulatory Visit (INDEPENDENT_AMBULATORY_CARE_PROVIDER_SITE_OTHER): Payer: BC Managed Care – PPO | Admitting: Women's Health

## 2019-10-28 VITALS — BP 124/78 | HR 76 | Ht 70.0 in | Wt 230.4 lb

## 2019-10-28 DIAGNOSIS — N898 Other specified noninflammatory disorders of vagina: Secondary | ICD-10-CM

## 2019-10-28 DIAGNOSIS — Z113 Encounter for screening for infections with a predominantly sexual mode of transmission: Secondary | ICD-10-CM | POA: Insufficient documentation

## 2019-10-28 DIAGNOSIS — Z3201 Encounter for pregnancy test, result positive: Secondary | ICD-10-CM

## 2019-10-28 DIAGNOSIS — A599 Trichomoniasis, unspecified: Secondary | ICD-10-CM | POA: Diagnosis not present

## 2019-10-28 DIAGNOSIS — Z3491 Encounter for supervision of normal pregnancy, unspecified, first trimester: Secondary | ICD-10-CM

## 2019-10-28 LAB — POCT WET PREP (WET MOUNT): Clue Cells Wet Prep Whiff POC: POSITIVE

## 2019-10-28 LAB — POCT URINE PREGNANCY: Preg Test, Ur: POSITIVE — AB

## 2019-10-28 MED ORDER — ENOXAPARIN SODIUM 100 MG/ML ~~LOC~~ SOLN: 100.0000 mg | Freq: Two times a day (BID) | SUBCUTANEOUS | 10 refills | Status: DC

## 2019-10-28 MED ORDER — METRONIDAZOLE 500 MG PO TABS: 500.0000 mg | ORAL_TABLET | Freq: Two times a day (BID) | ORAL | 0 refills | Status: DC

## 2019-10-28 NOTE — Patient Instructions (Signed)
Denise Khan, I greatly value your feedback.  If you receive a survey following your visit with us today, we appreciate you taking the time to fill it out.  Thanks, Denise Khan, CNM, WHNP-BC   [facility] ([address] [place], [place] [address]) Entrance C, located off of [address] Free 24/7 valet parking   Nausea & Vomiting  Have saltine crackers or pretzels by your bed and eat a few bites before you raise your head out of bed in the morning  Eat small frequent meals throughout the day instead of large meals  Drink plenty of fluids throughout the day to stay hydrated, just don't drink a lot of fluids with your meals.  This can make your stomach fill up faster making you feel sick  Do not brush your teeth right after you eat  Products with real ginger are good for nausea, like ginger ale and ginger hard candy Make sure it says made with real ginger!  Sucking on sour candy like lemon heads is also good for nausea  If your prenatal vitamins make you nauseated, take them at night so you will sleep through the nausea  Sea Bands  If you feel like you need medicine for the nausea & vomiting please let us know  If you are unable to keep any fluids or food down please let us know   Constipation  Drink plenty of fluid, preferably water, throughout the day  Eat foods high in fiber such as fruits, vegetables, and grains  Exercise, such as walking, is a good way to keep your bowels regular  Drink warm fluids, especially warm prune juice, or decaf coffee  Eat a 1/2 cup of real oatmeal (not instant), 1/2 cup applesauce, and 1/2-1 cup warm prune juice every day  If needed, you may take Colace (docusate sodium) stool softener once or twice a day to help keep the stool soft.   If you still are having problems with constipation, you may take Miralax once daily as needed to help keep your bowels regular.   Home Blood Pressure Monitoring for Patients    Your provider has recommended that you check your blood pressure (BP) at least once a week at home. If you do not have a blood pressure cuff at home, one will be provided for you. Contact your provider if you have not received your monitor within 1 week.   Helpful Tips for Accurate Home Blood Pressure Checks  . Don't smoke, exercise, or drink caffeine 30 minutes before checking your BP . Use the restroom before checking your BP (a full bladder can raise your pressure) . Relax in a comfortable upright chair . Feet on the ground . Left arm resting comfortably on a flat surface at the level of your heart . Legs uncrossed . Back supported . Sit quietly and don't talk . Place the cuff on your bare arm . Adjust snuggly, so that only two fingertips can fit between your skin and the top of the cuff . Check 2 readings separated by at least one minute . Keep a log of your BP readings . For a visual, please reference this diagram: http://ccnc.care/bpdiagram  Provider Name: [facility]     Phone: [phone]  Zone 1: ALL CLEAR  Continue to monitor your symptoms:  . BP reading is less than 140 (top number) or less than 90 (bottom number)  . No right upper stomach pain . No headaches or   seeing spots . No feeling nauseated or throwing up . No swelling in face and hands  Zone 2: CAUTION Call your doctor's office for any of the following:  . BP reading is greater than 140 (top number) or greater than 90 (bottom number)  . Stomach pain under your ribs in the middle or right side . Headaches or seeing spots . Feeling nauseated or throwing up . Swelling in face and hands  Zone 3: EMERGENCY  Seek immediate medical care if you have any of the following:  . BP reading is greater than160 (top number) or greater than 110 (bottom number) . Severe headaches not improving with Tylenol . Serious difficulty catching your breath . Any worsening symptoms from Zone 2    First Trimester of  Pregnancy The first trimester of pregnancy is from week 1 until the end of week 12 (months 1 through 3). A week after a sperm fertilizes an egg, the egg will implant on the wall of the uterus. This embryo will begin to develop into a baby. Genes from you and your partner are forming the baby. The female genes determine whether the baby is a boy or a girl. At 6-8 weeks, the eyes and face are formed, and the heartbeat can be seen on ultrasound. At the end of 12 weeks, all the baby's organs are formed.  Now that you are pregnant, you will want to do everything you can to have a healthy baby. Two of the most important things are to get good prenatal care and to follow your health care provider's instructions. Prenatal care is all the medical care you receive before the baby's birth. This care will help prevent, find, and treat any problems during the pregnancy and childbirth. BODY CHANGES Your body goes through many changes during pregnancy. The changes vary from woman to woman.   You may gain or lose a couple of pounds at first.  You may feel sick to your stomach (nauseous) and throw up (vomit). If the vomiting is uncontrollable, call your health care provider.  You may tire easily.  You may develop headaches that can be relieved by medicines approved by your health care provider.  You may urinate more often. Painful urination may mean you have a bladder infection.  You may develop heartburn as a result of your pregnancy.  You may develop constipation because certain hormones are causing the muscles that push waste through your intestines to slow down.  You may develop hemorrhoids or swollen, bulging veins (varicose veins).  Your breasts may begin to grow larger and become tender. Your nipples may stick out more, and the tissue that surrounds them (areola) may become darker.  Your gums may bleed and may be sensitive to brushing and flossing.  Dark spots or blotches (chloasma, mask of pregnancy)  may develop on your face. This will likely fade after the baby is born.  Your menstrual periods will stop.  You may have a loss of appetite.  You may develop cravings for certain kinds of food.  You may have changes in your emotions from day to day, such as being excited to be pregnant or being concerned that something may go wrong with the pregnancy and baby.  You may have more vivid and strange dreams.  You may have changes in your hair. These can include thickening of your hair, rapid growth, and changes in texture. Some women also have hair loss during or after pregnancy, or hair that feels dry or thin. Your  hair will most likely return to normal after your baby is born. WHAT TO EXPECT AT YOUR PRENATAL VISITS During a routine prenatal visit:  You will be weighed to make sure you and the baby are growing normally.  Your blood pressure will be taken.  Your abdomen will be measured to track your baby's growth.  The fetal heartbeat will be listened to starting around week 10 or 12 of your pregnancy.  Test results from any previous visits will be discussed. Your health care provider may ask you:  How you are feeling.  If you are feeling the baby move.  If you have had any abnormal symptoms, such as leaking fluid, bleeding, severe headaches, or abdominal cramping.  If you have any questions. Other tests that may be performed during your first trimester include:  Blood tests to find your blood type and to check for the presence of any previous infections. They will also be used to check for low iron levels (anemia) and Rh antibodies. Later in the pregnancy, blood tests for diabetes will be done along with other tests if problems develop.  Urine tests to check for infections, diabetes, or protein in the urine.  An ultrasound to confirm the proper growth and development of the baby.  An amniocentesis to check for possible genetic problems.  Fetal screens for spina bifida and  Down syndrome.  You may need other tests to make sure you and the baby are doing well. HOME CARE INSTRUCTIONS  Medicines  Follow your health care provider's instructions regarding medicine use. Specific medicines may be either safe or unsafe to take during pregnancy.  Take your prenatal vitamins as directed.  If you develop constipation, try taking a stool softener if your health care provider approves. Diet  Eat regular, well-balanced meals. Choose a variety of foods, such as meat or vegetable-based protein, fish, milk and low-fat dairy products, vegetables, fruits, and whole grain breads and cereals. Your health care provider will help you determine the amount of weight gain that is right for you.  Avoid raw meat and uncooked cheese. These carry germs that can cause birth defects in the baby.  Eating four or five small meals rather than three large meals a day may help relieve nausea and vomiting. If you start to feel nauseous, eating a few soda crackers can be helpful. Drinking liquids between meals instead of during meals also seems to help nausea and vomiting.  If you develop constipation, eat more high-fiber foods, such as fresh vegetables or fruit and whole grains. Drink enough fluids to keep your urine clear or pale yellow. Activity and Exercise  Exercise only as directed by your health care provider. Exercising will help you:  Control your weight.  Stay in shape.  Be prepared for labor and delivery.  Experiencing pain or cramping in the lower abdomen or low back is a good sign that you should stop exercising. Check with your health care provider before continuing normal exercises.  Try to avoid standing for long periods of time. Move your legs often if you must stand in one place for a long time.  Avoid heavy lifting.  Wear low-heeled shoes, and practice good posture.  You may continue to have sex unless your health care provider directs you otherwise. Relief of Pain  or Discomfort  Wear a good support bra for breast tenderness.    Take warm sitz baths to soothe any pain or discomfort caused by hemorrhoids. Use hemorrhoid cream if your health care provider  approves.    Rest with your legs elevated if you have leg cramps or low back pain.  If you develop varicose veins in your legs, wear support hose. Elevate your feet for 15 minutes, 3-4 times a day. Limit salt in your diet. Prenatal Care  Schedule your prenatal visits by the twelfth week of pregnancy. They are usually scheduled monthly at first, then more often in the last 2 months before delivery.  Write down your questions. Take them to your prenatal visits.  Keep all your prenatal visits as directed by your health care provider. Safety  Wear your seat belt at all times when driving.  Make a list of emergency phone numbers, including numbers for family, friends, the hospital, and police and fire departments. General Tips  Ask your health care provider for a referral to a local prenatal education class. Begin classes no later than at the beginning of month 6 of your pregnancy.  Ask for help if you have counseling or nutritional needs during pregnancy. Your health care provider can offer advice or refer you to specialists for help with various needs.  Do not use hot tubs, steam rooms, or saunas.  Do not douche or use tampons or scented sanitary pads.  Do not cross your legs for long periods of time.  Avoid cat litter boxes and soil used by cats. These carry germs that can cause birth defects in the baby and possibly loss of the fetus by miscarriage or stillbirth.  Avoid all smoking, herbs, alcohol, and medicines not prescribed by your health care provider. Chemicals in these affect the formation and growth of the baby.  Schedule a dentist appointment. At home, brush your teeth with a soft toothbrush and be gentle when you floss. SEEK MEDICAL CARE IF:   You have dizziness.  You have mild  pelvic cramps, pelvic pressure, or nagging pain in the abdominal area.  You have persistent nausea, vomiting, or diarrhea.  You have a bad smelling vaginal discharge.  You have pain with urination.  You notice increased swelling in your face, hands, legs, or ankles. SEEK IMMEDIATE MEDICAL CARE IF:   You have a fever.  You are leaking fluid from your vagina.  You have spotting or bleeding from your vagina.  You have severe abdominal cramping or pain.  You have rapid weight gain or loss.  You vomit blood or material that looks like coffee grounds.  You are exposed to [place] measles and have never had them.  You are exposed to fifth disease or chickenpox.  You develop a severe headache.  You have shortness of breath.  You have any kind of trauma, such as from a fall or a car accident. Document Released: 06/12/2001 Document Revised: 11/02/2013 Document Reviewed: 04/28/2013 [facility] Patient Information 2015 [place], [place]. This information is not intended to replace advice given to you by your health care provider. Make sure you discuss any questions you have with your health care provider.

## 2019-10-28 NOTE — Progress Notes (Signed)
GYN VISIT Patient name: Denise Khan MRN [id number]  Date of birth: 11/10/1985 Chief Complaint:   Vaginal Discharge (? yeast infection)  History of Present Illness:   Denise Khan is a 34 y.o. G17P1001 African American female being seen today for vulvovaginal itching, thought she had yeast infection, tried otc yeast med which helped for about 1-2 days then itching returned. LMP 3/17, so UPT done and is positive! Pt surprised, states she wasn't trying to get pregnant, but wasn't preventing it. Not on pnv. No n/v. Had AML in 2017, has finished all treatments. 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 Neg workup. Is on xarelto.  Patient's last menstrual period was 09/16/2019 (exact date). The current method of family planning is none.  Last pap 03/10/18. Results were:  normal Review of Systems:   Pertinent items are noted in HPI Denies fever/chills, dizziness, headaches, visual disturbances, fatigue, shortness of breath, chest pain, abdominal pain, vomiting, abnormal vaginal discharge/itching/odor/irritation, problems with periods, bowel movements, urination, or intercourse unless otherwise stated above.  Pertinent History Reviewed:  Reviewed past medical,surgical, social, obstetrical and family history.  Reviewed problem list, medications and allergies. Physical Assessment:   Vitals:   10/28/19 1007  BP: 124/78  Pulse: 76  Weight: 230 lb 6.4 oz (104.5 kg)  Height: 5\' 10"  (1.778 m)  Body mass index is 33.06 kg/m.       Physical Examination:   General appearance: alert, well appearing, and in no distress  Mental status: alert, oriented to person, place, and time  Skin: warm & dry   Cardiovascular: normal heart rate noted  Respiratory: normal respiratory effort, no distress  Abdomen: soft, non-tender   Pelvic: VULVA: normal appearing vulva with no masses, tenderness or lesions, VAGINA: normal  appearing vagina with normal color and thin malodorous frothy discharge, no lesions, CERVIX: normal appearing cervix without discharge or lesions  Extremities: no edema   Chaperone: Rolena Infante    Results for orders placed or performed in visit on 10/28/19 (from the past 24 hour(s))  POCT urine pregnancy   Collection Time: 10/28/19 10:13 AM  Result Value Ref Range   Preg Test, Ur Positive (A) Negative  POCT Wet Prep [facility])   Collection Time: 10/28/19  5:17 PM  Result Value Ref Range   Source Wet Prep POC vaginal    WBC, Wet Prep HPF POC many    Bacteria Wet Prep HPF POC Few Few   BACTERIA WET PREP MORPHOLOGY POC     Clue Cells Wet Prep HPF POC Few (A) None   Clue Cells Wet Prep Whiff POC Positive Whiff    Yeast Wet Prep HPF POC None None   KOH Wet Prep POC     Trichomonas Wet Prep HPF POC Present (A) Absent    Assessment & Plan:  1) [redacted]wk pregnant by LMP> start pnv, return in 2wks for dating u/s  2) Trichomonas> rx flagyl for pt and partner, no sex x at least 7d from time both finished taking meds  3) H/O PE x 2 on xarelto> discussed w/ LHE, switch to Lovenox 100mg  BID, rx sent, pt to come tomorrow for instruction on self-administration  4) H/O AML  Meds:  Meds ordered this encounter  Medications  . metroNIDAZOLE (FLAGYL) 500 MG tablet    Sig: Take 1 tablet (500 mg total) by mouth 2 (two) times daily.    Dispense:  14  tablet    Refill:  0    Order Specific Question:   Supervising Provider    Answer:   Elonda Husky, LUTHER H [[id number]]  . enoxaparin (LOVENOX) 100 MG/ML injection    Sig: Inject 1 mL (100 mg total) into the skin every 12 (twelve) hours.    Dispense:  60 mL    Refill:  10    Dispense pen if possible    Order Specific Question:   Supervising Provider    Answer:   Tania Ade H [[id number]]    Orders Placed This Encounter  Procedures  . US OB Comp Less 14 Wks  . POCT urine pregnancy  . POCT Wet Prep [facility])    Return in about 2 weeks (around 11/11/2019)  for dating u/s; tomorrow for injection teaching w/ Tish.  [place], [facility] 10/28/2019 5:18 PM

## 2019-10-29 ENCOUNTER — Ambulatory Visit (INDEPENDENT_AMBULATORY_CARE_PROVIDER_SITE_OTHER): Payer: BC Managed Care – PPO | Admitting: *Deleted

## 2019-10-29 DIAGNOSIS — Z7189 Other specified counseling: Secondary | ICD-10-CM | POA: Diagnosis not present

## 2019-10-29 DIAGNOSIS — Z86711 Personal history of pulmonary embolism: Secondary | ICD-10-CM | POA: Diagnosis not present

## 2019-10-29 DIAGNOSIS — Z3A01 Less than 8 weeks gestation of pregnancy: Secondary | ICD-10-CM

## 2019-10-29 NOTE — Progress Notes (Signed)
   NURSE VISIT- INJECTION Education  SUBJECTIVE:  Denise Khan is a 34 y.o. G60P1001 female here for Lovenox injection education and administration for history of PE x2. She is [redacted]w[redacted]d pregnant.   OBJECTIVE:  LMP 09/21/2019   Appears well, in no apparent distress  Injection administered in: Left lower quad. abdomen  Meds ordered this encounter  Medications  . enoxaparin (LOVENOX) injection 100 mg    ASSESSMENT: Pregnancy [redacted]w[redacted]d for Lovenox injection education and administration for hx PEx2. PLAN: Follow-up: as scheduled   Alice Rieger  10/30/2019 1:57 PM

## 2019-10-30 LAB — CERVICOVAGINAL ANCILLARY ONLY
Chlamydia: NEGATIVE
Comment: NEGATIVE
Comment: NORMAL
Neisseria Gonorrhea: NEGATIVE

## 2019-10-30 MED ORDER — ENOXAPARIN SODIUM 100 MG/ML ~~LOC~~ SOLN
100.0000 mg | Freq: Once | SUBCUTANEOUS | Status: AC
  Administered 2019-10-29: 14:00:00 100 mg via SUBCUTANEOUS

## 2019-11-11 ENCOUNTER — Ambulatory Visit (INDEPENDENT_AMBULATORY_CARE_PROVIDER_SITE_OTHER): Payer: BC Managed Care – PPO

## 2019-11-11 DIAGNOSIS — Z3A08 8 weeks gestation of pregnancy: Secondary | ICD-10-CM

## 2019-11-11 DIAGNOSIS — Z3491 Encounter for supervision of normal pregnancy, unspecified, first trimester: Secondary | ICD-10-CM | POA: Diagnosis not present

## 2019-11-11 NOTE — Progress Notes (Signed)
[place] 8 wks,single IUP with YS,positive fht 141 bpm,normal ovaries,CRL 13.9 mm,mult fibroids (#1) fundal 3.1 x 3.6 x 3.1cm,(#2) posterior 3.3 x  2.9 x 2.6 cm,(#3) posterior 1.7 x 1.8 x 1.8 cm

## 2019-12-03 ENCOUNTER — Other Ambulatory Visit: Payer: Self-pay | Admitting: Obstetrics & Gynecology

## 2019-12-03 DIAGNOSIS — Z3682 Encounter for antenatal screening for nuchal translucency: Secondary | ICD-10-CM

## 2019-12-11 DIAGNOSIS — O0990 Supervision of high risk pregnancy, unspecified, unspecified trimester: Secondary | ICD-10-CM | POA: Insufficient documentation

## 2019-12-14 ENCOUNTER — Ambulatory Visit (INDEPENDENT_AMBULATORY_CARE_PROVIDER_SITE_OTHER): Payer: BC Managed Care – PPO

## 2019-12-14 ENCOUNTER — Ambulatory Visit: Payer: BC Managed Care – PPO | Admitting: Women's Health

## 2019-12-14 ENCOUNTER — Other Ambulatory Visit (HOSPITAL_COMMUNITY)
Admission: RE | Admit: 2019-12-14 | Discharge: 2019-12-14 | Disposition: A | Discharge status: Home / Self Care | Payer: BC Managed Care – PPO | Source: Ambulatory Visit | Attending: Advanced Practice Midwife | Admitting: Advanced Practice Midwife

## 2019-12-14 ENCOUNTER — Encounter: Payer: Self-pay | Admitting: Women's Health

## 2019-12-14 VITALS — BP 129/85 | HR 67 | Wt 229.0 lb

## 2019-12-14 DIAGNOSIS — Z1389 Encounter for screening for other disorder: Secondary | ICD-10-CM

## 2019-12-14 DIAGNOSIS — A599 Trichomoniasis, unspecified: Secondary | ICD-10-CM | POA: Insufficient documentation

## 2019-12-14 DIAGNOSIS — Z331 Pregnant state, incidental: Secondary | ICD-10-CM

## 2019-12-14 DIAGNOSIS — Z3A12 12 weeks gestation of pregnancy: Secondary | ICD-10-CM

## 2019-12-14 DIAGNOSIS — Z7901 Long term (current) use of anticoagulants: Secondary | ICD-10-CM

## 2019-12-14 DIAGNOSIS — Z3682 Encounter for antenatal screening for nuchal translucency: Secondary | ICD-10-CM

## 2019-12-14 DIAGNOSIS — Z113 Encounter for screening for infections with a predominantly sexual mode of transmission: Secondary | ICD-10-CM

## 2019-12-14 DIAGNOSIS — O0991 Supervision of high risk pregnancy, unspecified, first trimester: Secondary | ICD-10-CM | POA: Diagnosis present

## 2019-12-14 LAB — POCT URINALYSIS DIPSTICK OB
Blood, UA: NEGATIVE
Glucose, UA: NEGATIVE
Ketones, UA: NEGATIVE
Leukocytes, UA: NEGATIVE
Nitrite, UA: NEGATIVE
POC,PROTEIN,UA: NEGATIVE

## 2019-12-14 MED ORDER — GLYCOPYRROLATE 2 MG PO TABS: 2.0000 mg | ORAL_TABLET | Freq: Two times a day (BID) | ORAL | 6 refills | Status: DC

## 2019-12-14 MED ORDER — BONJESTA 20-20 MG PO TBCR: 1.0000 | EXTENDED_RELEASE_TABLET | Freq: Every day | ORAL | 8 refills | Status: DC

## 2019-12-14 NOTE — Progress Notes (Signed)
[place] 12+5 wks,measurement c/w dates,crl 63.18 mm,fhr 160 bpm,anterior placenta,normal ovaries,NB present,NT 1.7 mm,mult fibroids (#1) fundal 3.1 x 2.4 x 3.4 cm,(#2) anterior 2.8 x 2.7 x 1.5 cm,(#3) posterior 3.6 x 4.1 cm

## 2019-12-14 NOTE — Progress Notes (Signed)
INITIAL OBSTETRICAL VISIT Patient name: Denise Khan MRN [id number]  Date of birth: 14-Aug-1985 Chief Complaint:   Initial Prenatal Visit (nt/it, "spitting")  History of Present Illness:   Denise Khan is a 34 y.o. G12P1001 African American female at [redacted]w[redacted]d by LMP, c/w 7wk u/s, with an Estimated Date of Delivery: 06/27/20 being seen today for her initial obstetrical visit.   Her obstetrical history is significant for term uncomplicated SVB x 1.   Today she reports nausea, no vomiting- requests meds. Lots of spitting, has to carry cup-requests meds.  H/O PE x 2- 1st w/ nexplanon in towards time to remove, neg work-up, 2nd after cancer tx- developed pneumonia then PE. H/O acute myeloblastic leukemia.  Trichomonas 10/28/19, pt and partner took all meds and waited 2wks to have sex Depression screen [facility] 2/9 12/14/2019 03/10/2018 03/10/2018 05/07/2017 02/06/2017  Decreased Interest 1 2 2  0 0  Down, Depressed, Hopeless 0 2 2 0 0  PHQ - 2 Score 1 4 4  0 0  Altered sleeping 0 1 - - -  Tired, decreased energy 1 1 - - -  Change in appetite 1 2 - - -  Feeling bad or failure about yourself  0 2 - - -  Trouble concentrating 0 2 - - -  Moving slowly or fidgety/restless 0 0 - - -  Suicidal thoughts 0 0 - - -  PHQ-9 Score 3 12 - - -  Difficult doing work/chores - Somewhat difficult - - -    Patient's last menstrual period was 09/18/2019. Last pap 03/10/18. Results were: normal Review of Systems:   Pertinent items are noted in HPI Denies cramping/contractions, leakage of fluid, vaginal bleeding, abnormal vaginal discharge w/ itching/odor/irritation, headaches, visual changes, shortness of breath, chest pain, abdominal pain, severe nausea/vomiting, or problems with urination or bowel movements unless otherwise stated above.  Pertinent History Reviewed:  Reviewed past medical,surgical, social, obstetrical and family history.  Reviewed problem list, medications and allergies. OB History  Gravida Para Term  Preterm AB Living  2 1 1     1   SAB TAB Ectopic Multiple Live Births          1    # Outcome Date GA Lbr Len/2nd Weight Sex Delivery Anes PTL Lv  2 Current           1 Term 06/28/10 [redacted]w[redacted]d  6 lb 14 oz (3.118 kg) M Vag-Spont EPI  LIV   Physical Assessment:   Vitals:   12/14/19 1214  BP: 129/85  Pulse: 67  Weight: 229 lb (103.9 kg)  Body mass index is 32.86 kg/m.       Physical Examination:  General appearance - well appearing, and in no distress  Mental status - alert, oriented to person, place, and time  Psych:  She has a normal mood and affect  Skin - warm and dry, normal color, no suspicious lesions noted  Chest - effort normal, all lung fields clear to auscultation bilaterally  Heart - normal rate and regular rhythm  Abdomen - soft, nontender  Extremities:  No swelling or varicosities noted  Pelvic - VULVA: normal appearing vulva with no masses, tenderness or lesions  VAGINA: normal appearing vagina with normal color and discharge, no lesions  CERVIX: normal appearing cervix without discharge or lesions, no CMT  Thin prep pap is not done   TODAY'S NT [place] 12+5 wks,measurement c/w dates,crl 63.18 mm,fhr 160 bpm,anterior placenta,normal ovaries,NB present,NT 1.7 mm,mult fibroids (#1) fundal 3.1 x 2.4 x  3.4 cm,(#2) anterior 2.8 x 2.7 x 1.5 cm,(#3) posterior 3.6 x 4.1 cm  Results for orders placed or performed in visit on 12/14/19 (from the past 24 hour(s))  POC Urinalysis Dipstick OB   Collection Time: 12/14/19 12:16 PM  Result Value Ref Range   Color, UA     Clarity, UA     Glucose, UA Negative Negative   Bilirubin, UA     Ketones, UA neg    Spec Grav, UA     Blood, UA neg    pH, UA     POC,PROTEIN,UA Negative Negative, Trace, Small (1+), Moderate (2+), Large (3+), 4+   Urobilinogen, UA     Nitrite, UA neg    Leukocytes, UA Negative Negative   Appearance     Odor      Assessment & Plan:  1) High-Risk Pregnancy G2P1001 at [redacted]w[redacted]d with an Estimated Date of Delivery:  06/27/20   2) Initial OB visit  3) H/O PE x 2>was on Xarelto, switched to Lovenox 100mg  BID on 10/28/19  4) H/O AML> in remission  5) Multiple fibroids> largest 4.1cm  6) Ptyalism> rx robinul  7) Nausea> rx Bonjesta  8) Recent trichomonas> poc today  Meds:  Meds ordered this encounter  Medications   Doxylamine-Pyridoxine ER (BONJESTA) 20-20 MG TBCR    Sig: Take 1 tablet by mouth at bedtime. Can add 1 tablet in the morning if needed for nausea and vomiting    Dispense:  60 tablet    Refill:  8    Order Specific Question:   Supervising Provider    Answer:   Tania Ade H [[id number]]   glycopyrrolate (ROBINUL-FORTE) 2 MG tablet    Sig: Take 1 tablet (2 mg total) by mouth 2 (two) times daily.    Dispense:  60 tablet    Refill:  6    Order Specific Question:   Supervising Provider    Answer:   Tania Ade H [[id number]]    Initial labs obtained Continue prenatal vitamins Reviewed n/v relief measures and warning s/s to report Reviewed recommended weight gain based on pre-gravid BMI Encouraged well-balanced diet Genetic & carrier screening discussed: requests Panorama, NT/IT and Horizon 14  Ultrasound discussed; fetal survey: requested CCNC completed> form faxed if has or is planning to apply for medicaid The nature of [place] for [employer] with multiple MDs and other Advanced Practice Providers was explained to patient; also emphasized that fellows, residents, and students are part of our team. Has home bp cuff. Check bp weekly, let us know if >140/90.   Follow-up: Return in about 3 weeks (around 01/04/2020) for LROB, MD or CNM, in person.   Orders Placed This Encounter  Procedures   Urine Culture   Integrated 1   CBC/D/Plt+RPR+Rh+ABO+Rub Ab...   Genetic Screening   POC Urinalysis Dipstick OB    Roma Schanz CNM, [facility] 12/14/2019 2:46 PM

## 2019-12-14 NOTE — Patient Instructions (Signed)
Denise Khan, I greatly value your feedback.  If you receive a survey following your visit with [place] today, we appreciate you taking the time to fill it out.  Thanks, [name], CNM, WHNP-BC   [facility] ([place], [place] [id number]) Entrance C, located off of [address] parking   Nausea & Vomiting  Have saltine crackers or pretzels by your bed and eat a few bites before you raise your head out of bed in the morning  Eat small frequent meals throughout the day instead of large meals  Drink plenty of fluids throughout the day to stay hydrated, just don't drink a lot of fluids with your meals.  This can make your stomach fill up faster making you feel sick  Do not brush your teeth right after you eat  Products with real ginger are good for nausea, like ginger ale and ginger hard candy Make sure it says made with real ginger!  Sucking on sour candy like lemon heads is also good for nausea  If your prenatal vitamins make you nauseated, take them at night so you will sleep through the nausea  Sea Bands  If you feel like you need medicine for the nausea & vomiting please let us know  If you are unable to keep any fluids or food down please let us know   Constipation  Drink plenty of fluid, preferably water, throughout the day  Eat foods high in fiber such as fruits, vegetables, and grains  Exercise, such as walking, is a good way to keep your bowels regular  Drink warm fluids, especially warm prune juice, or decaf coffee  Eat a 1/2 cup of real oatmeal (not instant), 1/2 cup applesauce, and 1/2-1 cup warm prune juice every day  If needed, you may take Colace (docusate sodium) stool softener once or twice a day to help keep the stool soft.   If you still are having problems with constipation, you may take Miralax once daily as needed to help keep your bowels regular.   Home Blood Pressure Monitoring for Patients    Your provider has recommended that you check your blood pressure (BP) at least once a week at home. If you do not have a blood pressure cuff at home, one will be provided for you. Contact your provider if you have not received your monitor within 1 week.   Helpful Tips for Accurate Home Blood Pressure Checks  . Don't smoke, exercise, or drink caffeine 30 minutes before checking your BP . Use the restroom before checking your BP (a full bladder can raise your pressure) . Relax in a comfortable upright chair . Feet on the ground . Left arm resting comfortably on a flat surface at the level of your heart . Legs uncrossed . Back supported . Sit quietly and don't talk . Place the cuff on your bare arm . Adjust snuggly, so that only two fingertips can fit between your skin and the top of the cuff . Check 2 readings separated by at least one minute . Keep a log of your BP readings . For a visual, please reference this diagram: http://ccnc.care/bpdiagram  Provider Name: [facility]     Phone: [phone]  Zone 1: ALL CLEAR  Continue to monitor your symptoms:  . BP reading is less than 140 (top number) or less than 90 (bottom number)  . No right upper stomach pain . No headaches or  seeing spots . No feeling nauseated or throwing up . No swelling in face and hands  Zone 2: CAUTION Call your doctor's office for any of the following:  . BP reading is greater than 140 (top number) or greater than 90 (bottom number)  . Stomach pain under your ribs in the middle or right side . Headaches or seeing spots . Feeling nauseated or throwing up . Swelling in face and hands  Zone 3: EMERGENCY  Seek immediate medical care if you have any of the following:  . BP reading is greater than160 (top number) or greater than 110 (bottom number) . Severe headaches not improving with Tylenol . Serious difficulty catching your breath . Any worsening symptoms from Zone 2    First Trimester of  Pregnancy The first trimester of pregnancy is from week 1 until the end of week 12 (months 1 through 3). A week after a sperm fertilizes an egg, the egg will implant on the wall of the uterus. This embryo will begin to develop into a baby. Genes from you and your partner are forming the baby. The female genes determine whether the baby is a boy or a girl. At 6-8 weeks, the eyes and face are formed, and the heartbeat can be seen on ultrasound. At the end of 12 weeks, all the baby's organs are formed.  Now that you are pregnant, you will want to do everything you can to have a healthy baby. Two of the most important things are to get good prenatal care and to follow your health care provider's instructions. Prenatal care is all the medical care you receive before the baby's birth. This care will help prevent, find, and treat any problems during the pregnancy and childbirth. BODY CHANGES Your body goes through many changes during pregnancy. The changes vary from woman to woman.   You may gain or lose a couple of pounds at first.  You may feel sick to your stomach (nauseous) and throw up (vomit). If the vomiting is uncontrollable, call your health care provider.  You may tire easily.  You may develop headaches that can be relieved by medicines approved by your health care provider.  You may urinate more often. Painful urination may mean you have a bladder infection.  You may develop heartburn as a result of your pregnancy.  You may develop constipation because certain hormones are causing the muscles that push waste through your intestines to slow down.  You may develop hemorrhoids or swollen, bulging veins (varicose veins).  Your breasts may begin to grow larger and become tender. Your nipples may stick out more, and the tissue that surrounds them (areola) may become darker.  Your gums may bleed and may be sensitive to brushing and flossing.  Dark spots or blotches (chloasma, mask of pregnancy)  may develop on your face. This will likely fade after the baby is born.  Your menstrual periods will stop.  You may have a loss of appetite.  You may develop cravings for certain kinds of food.  You may have changes in your emotions from day to day, such as being excited to be pregnant or being concerned that something may go wrong with the pregnancy and baby.  You may have more vivid and strange dreams.  You may have changes in your hair. These can include thickening of your hair, rapid growth, and changes in texture. Some women also have hair loss during or after pregnancy, or hair that feels dry or thin. Your  hair will most likely return to normal after your baby is born. WHAT TO EXPECT AT YOUR PRENATAL VISITS During a routine prenatal visit:  You will be weighed to make sure you and the baby are growing normally.  Your blood pressure will be taken.  Your abdomen will be measured to track your baby's growth.  The fetal heartbeat will be listened to starting around week 10 or 12 of your pregnancy.  Test results from any previous visits will be discussed. Your health care provider may ask you:  How you are feeling.  If you are feeling the baby move.  If you have had any abnormal symptoms, such as leaking fluid, bleeding, severe headaches, or abdominal cramping.  If you have any questions. Other tests that may be performed during your first trimester include:  Blood tests to find your blood type and to check for the presence of any previous infections. They will also be used to check for low iron levels (anemia) and Rh antibodies. Later in the pregnancy, blood tests for diabetes will be done along with other tests if problems develop.  Urine tests to check for infections, diabetes, or protein in the urine.  An ultrasound to confirm the proper growth and development of the baby.  An amniocentesis to check for possible genetic problems.  Fetal screens for spina bifida and  Down syndrome.  You may need other tests to make sure you and the baby are doing well. HOME CARE INSTRUCTIONS  Medicines  Follow your health care provider's instructions regarding medicine use. Specific medicines may be either safe or unsafe to take during pregnancy.  Take your prenatal vitamins as directed.  If you develop constipation, try taking a stool softener if your health care provider approves. Diet  Eat regular, well-balanced meals. Choose a variety of foods, such as meat or vegetable-based protein, fish, milk and low-fat dairy products, vegetables, fruits, and whole grain breads and cereals. Your health care provider will help you determine the amount of weight gain that is right for you.  Avoid raw meat and uncooked cheese. These carry germs that can cause birth defects in the baby.  Eating four or five small meals rather than three large meals a day may help relieve nausea and vomiting. If you start to feel nauseous, eating a few soda crackers can be helpful. Drinking liquids between meals instead of during meals also seems to help nausea and vomiting.  If you develop constipation, eat more high-fiber foods, such as fresh vegetables or fruit and whole grains. Drink enough fluids to keep your urine clear or pale yellow. Activity and Exercise  Exercise only as directed by your health care provider. Exercising will help you:  Control your weight.  Stay in shape.  Be prepared for labor and delivery.  Experiencing pain or cramping in the lower abdomen or low back is a good sign that you should stop exercising. Check with your health care provider before continuing normal exercises.  Try to avoid standing for long periods of time. Move your legs often if you must stand in one place for a long time.  Avoid heavy lifting.  Wear low-heeled shoes, and practice good posture.  You may continue to have sex unless your health care provider directs you otherwise. Relief of Pain  or Discomfort  Wear a good support bra for breast tenderness.    Take warm sitz baths to soothe any pain or discomfort caused by hemorrhoids. Use hemorrhoid cream if your health care provider  approves.    Rest with your legs elevated if you have leg cramps or low back pain.  If you develop varicose veins in your legs, wear support hose. Elevate your feet for 15 minutes, 3-4 times a day. Limit salt in your diet. Prenatal Care  Schedule your prenatal visits by the twelfth week of pregnancy. They are usually scheduled monthly at first, then more often in the last 2 months before delivery.  Write down your questions. Take them to your prenatal visits.  Keep all your prenatal visits as directed by your health care provider. Safety  Wear your seat belt at all times when driving.  Make a list of emergency phone numbers, including numbers for family, friends, the hospital, and police and fire departments. General Tips  Ask your health care provider for a referral to a local prenatal education class. Begin classes no later than at the beginning of month 6 of your pregnancy.  Ask for help if you have counseling or nutritional needs during pregnancy. Your health care provider can offer advice or refer you to specialists for help with various needs.  Do not use hot tubs, steam rooms, or saunas.  Do not douche or use tampons or scented sanitary pads.  Do not cross your legs for long periods of time.  Avoid cat litter boxes and soil used by cats. These carry germs that can cause birth defects in the baby and possibly loss of the fetus by miscarriage or stillbirth.  Avoid all smoking, herbs, alcohol, and medicines not prescribed by your health care provider. Chemicals in these affect the formation and growth of the baby.  Schedule a dentist appointment. At home, brush your teeth with a soft toothbrush and be gentle when you floss. SEEK MEDICAL CARE IF:   You have dizziness.  You have mild  pelvic cramps, pelvic pressure, or nagging pain in the abdominal area.  You have persistent nausea, vomiting, or diarrhea.  You have a bad smelling vaginal discharge.  You have pain with urination.  You notice increased swelling in your face, hands, legs, or ankles. SEEK IMMEDIATE MEDICAL CARE IF:   You have a fever.  You are leaking fluid from your vagina.  You have spotting or bleeding from your vagina.  You have severe abdominal cramping or pain.  You have rapid weight gain or loss.  You vomit blood or material that looks like coffee grounds.  You are exposed to [place] measles and have never had them.  You are exposed to fifth disease or chickenpox.  You develop a severe headache.  You have shortness of breath.  You have any kind of trauma, such as from a fall or a car accident. Document Released: 06/12/2001 Document Revised: 11/02/2013 Document Reviewed: 04/28/2013 [facility] Patient Information 2015 [place], [place]. This information is not intended to replace advice given to you by your health care provider. Make sure you discuss any questions you have with your health care provider.

## 2019-12-16 LAB — CERVICOVAGINAL ANCILLARY ONLY
Chlamydia: NEGATIVE
Comment: NEGATIVE
Comment: NEGATIVE
Comment: NORMAL
Neisseria Gonorrhea: NEGATIVE
Trichomonas: NEGATIVE

## 2019-12-16 LAB — URINE CULTURE

## 2019-12-16 LAB — SPECIMEN STATUS REPORT

## 2019-12-17 LAB — CBC/D/PLT+RPR+RH+ABO+RUB AB...
Antibody Screen: NEGATIVE
Basophils Absolute: 0 10*3/uL (ref 0.0–0.2)
Basos: 0 %
EOS (ABSOLUTE): 0.1 10*3/uL (ref 0.0–0.4)
Eos: 1 %
HCV Ab: <0.1 s/co ratio (ref 0.0–0.9)
HIV Screen 4th Generation wRfx: NONREACTIVE
Hematocrit: 37.7 % (ref 34.0–46.6)
Hemoglobin: 13.2 g/dL (ref 11.1–15.9)
Hepatitis B Surface Ag: NEGATIVE
Immature Grans (Abs): 0.1 10*3/uL (ref 0.0–0.1)
Immature Granulocytes: 1 %
Lymphocytes Absolute: 2.3 10*3/uL (ref 0.7–3.1)
Lymphs: 27 %
MCH: 34.1 pg — ABNORMAL HIGH (ref 26.6–33.0)
MCHC: 35 g/dL (ref 31.5–35.7)
MCV: 97 fL (ref 79–97)
Monocytes Absolute: 0.9 10*3/uL (ref 0.1–0.9)
Monocytes: 11 %
Neutrophils Absolute: 5.3 10*3/uL (ref 1.4–7.0)
Neutrophils: 60 %
Platelets: 269 10*3/uL (ref 150–450)
RBC: 3.87 x10E6/uL (ref 3.77–5.28)
RDW: 12.7 % (ref 11.7–15.4)
RPR Ser Ql: NONREACTIVE
Rh Factor: POSITIVE
Rubella Antibodies, IGG: 1.8 index (ref >0.99)
WBC: 8.7 10*3/uL (ref 3.4–10.8)

## 2019-12-17 LAB — INTEGRATED 1
Crown Rump Length: 63.2 mm
Gest. Age on Collection Date: 12.7 weeks
Maternal Age at EDD: 35.6 yr
Nuchal Translucency (NT): 1.7 mm
Number of Fetuses: 1
PAPP-A Value: 1147.2 ng/mL
Weight: 229 [lb_av]

## 2019-12-17 LAB — HCV INTERPRETATION

## 2020-01-05 ENCOUNTER — Other Ambulatory Visit: Payer: Self-pay

## 2020-01-05 ENCOUNTER — Encounter: Payer: Self-pay | Admitting: Women's Health

## 2020-01-05 ENCOUNTER — Ambulatory Visit (INDEPENDENT_AMBULATORY_CARE_PROVIDER_SITE_OTHER): Payer: BC Managed Care – PPO | Admitting: Women's Health

## 2020-01-05 VITALS — BP 133/76 | HR 81 | Wt 243.0 lb

## 2020-01-05 DIAGNOSIS — Z3A15 15 weeks gestation of pregnancy: Secondary | ICD-10-CM

## 2020-01-05 DIAGNOSIS — Z1389 Encounter for screening for other disorder: Secondary | ICD-10-CM

## 2020-01-05 DIAGNOSIS — Z86711 Personal history of pulmonary embolism: Secondary | ICD-10-CM

## 2020-01-05 DIAGNOSIS — Z363 Encounter for antenatal screening for malformations: Secondary | ICD-10-CM

## 2020-01-05 DIAGNOSIS — Z856 Personal history of leukemia: Secondary | ICD-10-CM

## 2020-01-05 DIAGNOSIS — Z331 Pregnant state, incidental: Secondary | ICD-10-CM

## 2020-01-05 DIAGNOSIS — O0992 Supervision of high risk pregnancy, unspecified, second trimester: Secondary | ICD-10-CM

## 2020-01-05 DIAGNOSIS — Z3482 Encounter for supervision of other normal pregnancy, second trimester: Secondary | ICD-10-CM

## 2020-01-05 LAB — POCT URINALYSIS DIPSTICK OB
Blood, UA: NEGATIVE
Glucose, UA: NEGATIVE
Ketones, UA: NEGATIVE
Leukocytes, UA: NEGATIVE
Nitrite, UA: NEGATIVE
POC,PROTEIN,UA: NEGATIVE

## 2020-01-05 NOTE — Progress Notes (Signed)
LOW-RISK PREGNANCY VISIT Patient name: Denise Khan MRN [id number]  Date of birth: 03-11-1986 Chief Complaint:   Routine Prenatal Visit  History of Present Illness:   Denise Khan is a 34 y.o. G75P1001 female at [redacted]w[redacted]d with an Estimated Date of Delivery: 06/24/20 being seen today for ongoing management of a low-risk pregnancy.  Depression screen [facility] 2/9 12/14/2019 03/10/2018 03/10/2018 05/07/2017 02/06/2017  Decreased Interest 1 2 2  0 0  Down, Depressed, Hopeless 0 2 2 0 0  PHQ - 2 Score 1 4 4  0 0  Altered sleeping 0 1 - - -  Tired, decreased energy 1 1 - - -  Change in appetite 1 2 - - -  Feeling bad or failure about yourself  0 2 - - -  Trouble concentrating 0 2 - - -  Moving slowly or fidgety/restless 0 0 - - -  Suicidal thoughts 0 0 - - -  PHQ-9 Score 3 12 - - -  Difficult doing work/chores - Somewhat difficult - - -    Today she reports no complaints. Ptyalism improved, only takes robinul prn. Taking Lovenox 100mg  BID as directed.  . Vag. Bleeding: None.  Movement: Absent. denies leaking of fluid. Review of Systems:   Pertinent items are noted in HPI Denies abnormal vaginal discharge w/ itching/odor/irritation, headaches, visual changes, shortness of breath, chest pain, abdominal pain, severe nausea/vomiting, or problems with urination or bowel movements unless otherwise stated above. Pertinent History Reviewed:  Reviewed past medical,surgical, social, obstetrical and family history.  Reviewed problem list, medications and allergies. Physical Assessment:   Vitals:   01/05/20 1418  BP: 133/76  Pulse: 81  Weight: 243 lb (110.2 kg)  Body mass index is 34.87 kg/m.        Physical Examination:   General appearance: Well appearing, and in no distress  Mental status: Alert, oriented to person, place, and time  Skin: Warm & dry  Cardiovascular: Normal heart rate noted  Respiratory: Normal respiratory effort, no distress  Abdomen: Soft, gravid, nontender  Pelvic: Cervical  exam deferred         Extremities: Edema: None  Fetal Status: Fetal Heart Rate (bpm): 140   Movement: Absent    Chaperone: n/a    Results for orders placed or performed in visit on 01/05/20 (from the past 24 hour(s))  POC Urinalysis Dipstick OB   Collection Time: 01/05/20  2:24 PM  Result Value Ref Range   Color, UA     Clarity, UA     Glucose, UA Negative Negative   Bilirubin, UA     Ketones, UA neg    Spec Grav, UA     Blood, UA neg    pH, UA     POC,PROTEIN,UA Negative Negative, Trace, Small (1+), Moderate (2+), Large (3+), 4+   Urobilinogen, UA     Nitrite, UA neg    Leukocytes, UA Negative Negative   Appearance     Odor      Assessment & Plan:  1) Low-risk pregnancy G2P1001 at [redacted]w[redacted]d with an Estimated Date of Delivery: 06/24/20   2) H/O PE x 2, 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  Labs/procedures today: 2nd IT  Plan:  Continue routine obstetrical care  Next visit: prefers will be in person for anatomy u/s    Reviewed: Preterm labor symptoms and general obstetric precautions including but not limited to vaginal  bleeding, contractions, leaking of fluid and fetal movement were reviewed in detail with the patient.  All questions were answered. Has home bp cuff. Check bp weekly, let us know if >140/90.   Follow-up: Return in about 3 weeks (around 01/26/2020) for [place], NO:MVEHMCN, CNM.  Orders Placed This Encounter  Procedures  . US OB Comp + 14 Wk  . INTEGRATED 2  . POC Urinalysis Dipstick OB   Roma Schanz CNM, [facility] 01/05/2020 4:33 PM

## 2020-01-05 NOTE — Patient Instructions (Signed)
Denise Khan, I greatly value your feedback.  If you receive a survey following your visit with [place] today, we appreciate you taking the time to fill it out.  Thanks, [name], CNM, WHNP-BC  [facility] ([place], [place] [address]) Entrance C, located off of [address] parking  Go to [email or web address] to register for FREE online childbirth classes  [place] Pediatricians/Family Doctors:  [facility] [place] [phone]                 [place] [phone] (usually not accepting new patients unless you have family there already, you are always welcome to call and ask)       [facility] Department [phone]       [facility] Pediatricians/Family Doctors:   [facility]: [phone]  [facility]: [phone]  [facility]: [place] Doctors:   [facility]: [place] [facility]: [place]:  [place]: [phone]    Home Blood Pressure Monitoring for Patients   Your provider has recommended that you check your blood pressure (BP) at least once a week at home. If you do not have a blood pressure cuff at home, one will be provided for you. Contact your provider if you have not received your monitor within 1 week.   Helpful Tips for Accurate Home Blood Pressure Checks   Don't smoke, exercise, or drink caffeine 30 minutes before checking your BP  Use the restroom before checking your BP (a full bladder can raise your pressure)  Relax in a comfortable upright chair  Feet on the ground  Left arm resting comfortably on a flat surface at the level of your heart  Legs uncrossed  Back supported  Sit quietly and don't talk  Place the cuff on your bare arm  Adjust snuggly, so  that only two fingertips can fit between your skin and the top of the cuff  Check 2 readings separated by at least one minute  Keep a log of your BP readings  For a visual, please reference this diagram: http://ccnc.care/bpdiagram  Provider Name: [facility]     Phone: [phone]  Zone 1: ALL CLEAR  Continue to monitor your symptoms:   BP reading is less than 140 (top number) or less than 90 (bottom number)   No right upper stomach pain  No headaches or seeing spots  No feeling nauseated or throwing up  No swelling in face and hands  Zone 2: CAUTION Call your doctor's office for any of the following:   BP reading is greater than 140 (top number) or greater than 90 (bottom number)   Stomach pain under your ribs in the middle or right side  Headaches or seeing spots  Feeling nauseated or throwing up  Swelling in face and hands  Zone 3: EMERGENCY  Seek immediate medical care if you have any of the following:   BP reading is greater than160 (top number) or greater than 110 (bottom number)  Severe headaches not improving with Tylenol  Serious difficulty catching your breath  Any worsening symptoms from Zone 2     Second Trimester of Pregnancy The second trimester is from week 14 through week 27 (months 4 through 6). The second trimester is often a time when you feel your  best. Your body has adjusted to being pregnant, and you begin to feel better physically. Usually, morning sickness has lessened or quit completely, you may have more energy, and you may have an increase in appetite. The second trimester is also a time when the fetus is growing rapidly. At the end of the sixth month, the fetus is about 9 inches long and weighs about 1 pounds. You will likely begin to feel the baby move (quickening) between 16 and 20 weeks of pregnancy. Body changes during your second trimester Your body continues to go through many changes during your second trimester. The  changes vary from woman to woman.  Your weight will continue to increase. You will notice your lower abdomen bulging out.  You may begin to get stretch marks on your hips, abdomen, and breasts.  You may develop headaches that can be relieved by medicines. The medicines should be approved by your health care provider.  You may urinate more often because the fetus is pressing on your bladder.  You may develop or continue to have heartburn as a result of your pregnancy.  You may develop constipation because certain hormones are causing the muscles that push waste through your intestines to slow down.  You may develop hemorrhoids or swollen, bulging veins (varicose veins).  You may have back pain. This is caused by: ? Weight gain. ? Pregnancy hormones that are relaxing the joints in your pelvis. ? A shift in weight and the muscles that support your balance.  Your breasts will continue to grow and they will continue to become tender.  Your gums may bleed and may be sensitive to brushing and flossing.  Dark spots or blotches (chloasma, mask of pregnancy) may develop on your face. This will likely fade after the baby is born.  A dark line from your belly button to the pubic area (linea nigra) may appear. This will likely fade after the baby is born.  You may have changes in your hair. These can include thickening of your hair, rapid growth, and changes in texture. Some women also have hair loss during or after pregnancy, or hair that feels dry or thin. Your hair will most likely return to normal after your baby is born.  What to expect at prenatal visits During a routine prenatal visit:  You will be weighed to make sure you and the fetus are growing normally.  Your blood pressure will be taken.  Your abdomen will be measured to track your baby's growth.  The fetal heartbeat will be listened to.  Any test results from the previous visit will be discussed.  Your health care  provider may ask you:  How you are feeling.  If you are feeling the baby move.  If you have had any abnormal symptoms, such as leaking fluid, bleeding, severe headaches, or abdominal cramping.  If you are using any tobacco products, including cigarettes, chewing tobacco, and electronic cigarettes.  If you have any questions.  Other tests that may be performed during your second trimester include:  Blood tests that check for: ? Low iron levels (anemia). ? High blood sugar that affects pregnant women (gestational diabetes) between 52 and 28 weeks. ? Rh antibodies. This is to check for a protein on red blood cells (Rh factor).  Urine tests to check for infections, diabetes, or protein in the urine.  An ultrasound to confirm the proper growth and development of the baby.  An amniocentesis to check for possible genetic problems.  Fetal  screens for spina bifida and Down syndrome.  HIV (human immunodeficiency virus) testing. Routine prenatal testing includes screening for HIV, unless you choose not to have this test.  Follow these instructions at home: Medicines  Follow your health care provider's instructions regarding medicine use. Specific medicines may be either safe or unsafe to take during pregnancy.  Take a prenatal vitamin that contains at least 600 micrograms (mcg) of folic acid.  If you develop constipation, try taking a stool softener if your health care provider approves. Eating and drinking  Eat a balanced diet that includes fresh fruits and vegetables, whole grains, good sources of protein such as meat, eggs, or tofu, and low-fat dairy. Your health care provider will help you determine the amount of weight gain that is right for you.  Avoid raw meat and uncooked cheese. These carry germs that can cause birth defects in the baby.  If you have low calcium intake from food, talk to your health care provider about whether you should take a daily calcium  supplement.  Limit foods that are high in fat and processed sugars, such as fried and sweet foods.  To prevent constipation: ? Drink enough fluid to keep your urine clear or pale yellow. ? Eat foods that are high in fiber, such as fresh fruits and vegetables, whole grains, and beans. Activity  Exercise only as directed by your health care provider. Most women can continue their usual exercise routine during pregnancy. Try to exercise for 30 minutes at least 5 days a week. Stop exercising if you experience uterine contractions.  Avoid heavy lifting, wear low heel shoes, and practice good posture.  A sexual relationship may be continued unless your health care provider directs you otherwise. Relieving pain and discomfort  Wear a good support bra to prevent discomfort from breast tenderness.  Take warm sitz baths to soothe any pain or discomfort caused by hemorrhoids. Use hemorrhoid cream if your health care provider approves.  Rest with your legs elevated if you have leg cramps or low back pain.  If you develop varicose veins, wear support hose. Elevate your feet for 15 minutes, 3-4 times a day. Limit salt in your diet. Prenatal Care  Write down your questions. Take them to your prenatal visits.  Keep all your prenatal visits as told by your health care provider. This is important. Safety  Wear your seat belt at all times when driving.  Make a list of emergency phone numbers, including numbers for family, friends, the hospital, and police and fire departments. General instructions  Ask your health care provider for a referral to a local prenatal education class. Begin classes no later than the beginning of month 6 of your pregnancy.  Ask for help if you have counseling or nutritional needs during pregnancy. Your health care provider can offer advice or refer you to specialists for help with various needs.  Do not use hot tubs, steam rooms, or saunas.  Do not douche or use  tampons or scented sanitary pads.  Do not cross your legs for long periods of time.  Avoid cat litter boxes and soil used by cats. These carry germs that can cause birth defects in the baby and possibly loss of the fetus by miscarriage or stillbirth.  Avoid all smoking, herbs, alcohol, and unprescribed drugs. Chemicals in these products can affect the formation and growth of the baby.  Do not use any products that contain nicotine or tobacco, such as cigarettes and e-cigarettes. If you need help  quitting, ask your health care provider.  Visit your dentist if you have not gone yet during your pregnancy. Use a soft toothbrush to brush your teeth and be gentle when you floss. Contact a health care provider if:  You have dizziness.  You have mild pelvic cramps, pelvic pressure, or nagging pain in the abdominal area.  You have persistent nausea, vomiting, or diarrhea.  You have a bad smelling vaginal discharge.  You have pain when you urinate. Get help right away if:  You have a fever.  You are leaking fluid from your vagina.  You have spotting or bleeding from your vagina.  You have severe abdominal cramping or pain.  You have rapid weight gain or weight loss.  You have shortness of breath with chest pain.  You notice sudden or extreme swelling of your face, hands, ankles, feet, or legs.  You have not felt your baby move in over an hour.  You have severe headaches that do not go away when you take medicine.  You have vision changes. Summary  The second trimester is from week 14 through week 27 (months 4 through 6). It is also a time when the fetus is growing rapidly.  Your body goes through many changes during pregnancy. The changes vary from woman to woman.  Avoid all smoking, herbs, alcohol, and unprescribed drugs. These chemicals affect the formation and growth your baby.  Do not use any tobacco products, such as cigarettes, chewing tobacco, and e-cigarettes. If you  need help quitting, ask your health care provider.  Contact your health care provider if you have any questions. Keep all prenatal visits as told by your health care provider. This is important. This information is not intended to replace advice given to you by your health care provider. Make sure you discuss any questions you have with your health care provider. Document Released: 06/12/2001 Document Revised: 11/24/2015 Document Reviewed: 08/19/2012 Elsevier Interactive Patient Education  2017 [place] FLU! Because you are pregnant, we at [facility], along with the Centers for Disease Control (CDC), recommend that you receive the flu vaccine to protect yourself and your baby from the flu. The flu is more likely to cause severe illness in pregnant women than in women of reproductive age who are not pregnant. Changes in the immune system, heart, and lungs during pregnancy make pregnant women (and women up to two weeks postpartum) more prone to severe illness from flu, including illness resulting in hospitalization. Flu also may be harmful for a pregnant womans developing baby. A common flu symptom is fever, which may be associated with neural tube defects and other adverse outcomes for a developing baby. Getting vaccinated can also help protect a baby after birth from flu. (Mom passes antibodies onto the developing baby during her pregnancy.)  A Flu Vaccine is the Best Protection Against Flu Getting a flu vaccine is the first and most important step in protecting against flu. Pregnant women should get a flu shot and not the live attenuated influenza vaccine (LAIV), also known as nasal spray flu vaccine. Flu vaccines given during pregnancy help protect both the mother and her baby from flu. Vaccination has been shown to reduce the risk of flu-associated acute respiratory infection in pregnant women by up to one-half. A 2018 study showed that getting a flu shot  reduced a pregnant womans risk of being hospitalized with flu by an average of 40 percent. Pregnant women who get a  flu vaccine are also helping to protect their babies from flu illness for the first several months after their birth, when they are too young to get vaccinated.   A Long Record of Safety for Flu Shots in Pregnant Women Flu shots have been given to millions of pregnant women over many years with a good safety record. There is a lot of evidence that flu vaccines can be given safely during pregnancy; though these data are limited for the first trimester. The CDC recommends that pregnant women get vaccinated during any trimester of their pregnancy. It is very important for pregnant women to get the flu shot.   Other Preventive Actions In addition to getting a flu shot, pregnant women should take the same everyday preventive actions the CDC recommends of everyone, including covering coughs, washing hands often, and avoiding people who are sick.  Symptoms and Treatment If you get sick with flu symptoms call your doctor right away. There are antiviral drugs that can treat flu illness and prevent serious flu complications. The CDC recommends prompt treatment for people who have influenza infection or suspected influenza infection and who are at high risk of serious flu complications, such as people with asthma, diabetes (including gestational diabetes), or heart disease. Early treatment of influenza in hospitalized pregnant women has been shown to reduce the length of the hospital stay.  Symptoms Flu symptoms include fever, cough, sore throat, runny or stuffy nose, body aches, headache, chills and fatigue. Some people may also have vomiting and diarrhea. People may be infected with the flu and have respiratory symptoms without a fever.  Early Treatment is Important for Pregnant Women Treatment should begin as soon as possible because antiviral drugs work best when started early (within 48 hours  after symptoms start). Antiviral drugs can make your flu illness milder and make you feel better faster. They may also prevent serious health problems that can result from flu illness. Oral oseltamivir (Tamiflu) is the preferred treatment for pregnant women because it has the most studies available to suggest that it is safe and beneficial. Antiviral drugs require a prescription from your provider. Having a fever caused by flu infection or other infections early in pregnancy may be linked to birth defects in a baby. In addition to taking antiviral drugs, pregnant women who get a fever should treat their fever with Tylenol (acetaminophen) and contact their provider immediately.  When to [place] If you are pregnant and have any of these signs, seek care immediately:  Difficulty breathing or shortness of breath  Pain or pressure in the chest or abdomen  Sudden dizziness  Confusion  Severe or persistent vomiting  High fever that is not responding to Tylenol (or store brand equivalent)  Decreased or no movement of your baby  [email or web address]

## 2020-01-07 LAB — INTEGRATED 2
AFP MoM: 1.46
Alpha-Fetoprotein: 35.7 ng/mL
Crown Rump Length: 63.2 mm
DIA MoM: 2.09
DIA Value: 263.4 pg/mL
Estriol, Unconjugated: 0.53 ng/mL
Gest. Age on Collection Date: 12.7 weeks
Gestational Age: 15.7 weeks
Maternal Age at EDD: 35.6 yr
Nuchal Translucency (NT): 1.7 mm
Nuchal Translucency MoM: 1.2
Number of Fetuses: 1
PAPP-A MoM: 1.78
PAPP-A Value: 1147.2 ng/mL
Test Results:: NEGATIVE
Weight: 229 [lb_av]
Weight: 229 [lb_av]
hCG MoM: 1.85
hCG Value: 52.5 IU/mL
uE3 MoM: 0.69

## 2020-01-19 ENCOUNTER — Encounter: Payer: Self-pay | Admitting: *Deleted

## 2020-01-26 ENCOUNTER — Ambulatory Visit (INDEPENDENT_AMBULATORY_CARE_PROVIDER_SITE_OTHER): Payer: BC Managed Care – PPO

## 2020-01-26 ENCOUNTER — Encounter: Payer: Self-pay | Admitting: Obstetrics & Gynecology

## 2020-01-26 ENCOUNTER — Ambulatory Visit (INDEPENDENT_AMBULATORY_CARE_PROVIDER_SITE_OTHER): Payer: BC Managed Care – PPO | Admitting: Obstetrics & Gynecology

## 2020-01-26 VITALS — BP 132/86 | HR 92 | Wt 243.0 lb

## 2020-01-26 DIAGNOSIS — Z3A18 18 weeks gestation of pregnancy: Secondary | ICD-10-CM | POA: Diagnosis not present

## 2020-01-26 DIAGNOSIS — Z331 Pregnant state, incidental: Secondary | ICD-10-CM

## 2020-01-26 DIAGNOSIS — Z363 Encounter for antenatal screening for malformations: Secondary | ICD-10-CM | POA: Diagnosis not present

## 2020-01-26 DIAGNOSIS — O0992 Supervision of high risk pregnancy, unspecified, second trimester: Secondary | ICD-10-CM

## 2020-01-26 DIAGNOSIS — Z3402 Encounter for supervision of normal first pregnancy, second trimester: Secondary | ICD-10-CM

## 2020-01-26 DIAGNOSIS — Z1389 Encounter for screening for other disorder: Secondary | ICD-10-CM

## 2020-01-26 DIAGNOSIS — Z86711 Personal history of pulmonary embolism: Secondary | ICD-10-CM

## 2020-01-26 DIAGNOSIS — O09522 Supervision of elderly multigravida, second trimester: Secondary | ICD-10-CM

## 2020-01-26 DIAGNOSIS — Z3482 Encounter for supervision of other normal pregnancy, second trimester: Secondary | ICD-10-CM

## 2020-01-26 NOTE — Progress Notes (Signed)
   HIGH-RISK PREGNANCY VISIT Patient name: Denise Khan MRN [id number]  Date of birth: 1985-11-26 Chief Complaint:   Routine Prenatal Visit ([place])  History of Present Illness:   Denise Khan is a 34 y.o. G54P1001 female at [redacted]w[redacted]d with an Estimated Date of Delivery: 06/24/20 being seen today for ongoing management of a high-risk pregnancy complicated by AMA, hx of PE x 2, hx of leukemia.  Today she reports no complaints.  Depression screen [facility] 2/9 12/14/2019 03/10/2018 03/10/2018 05/07/2017 02/06/2017  Decreased Interest 1 2 2  0 0  Down, Depressed, Hopeless 0 2 2 0 0  PHQ - 2 Score 1 4 4  0 0  Altered sleeping 0 1 - - -  Tired, decreased energy 1 1 - - -  Change in appetite 1 2 - - -  Feeling bad or failure about yourself  0 2 - - -  Trouble concentrating 0 2 - - -  Moving slowly or fidgety/restless 0 0 - - -  Suicidal thoughts 0 0 - - -  PHQ-9 Score 3 12 - - -  Difficult doing work/chores - Somewhat difficult - - -     . Vag. Bleeding: None.  Movement: Present. denies leaking of fluid.  Review of Systems:   Pertinent items are noted in HPI Denies abnormal vaginal discharge w/ itching/odor/irritation, headaches, visual changes, shortness of breath, chest pain, abdominal pain, severe nausea/vomiting, or problems with urination or bowel movements unless otherwise stated above. Pertinent History Reviewed:  Reviewed past medical,surgical, social, obstetrical and family history.  Reviewed problem list, medications and allergies. Physical Assessment:   Vitals:   01/26/20 1446  BP: (!) 132/86  Pulse: 92  Weight: (!) 243 lb (110.2 kg)  Body mass index is 34.87 kg/m.           Physical Examination:   General appearance: alert, well appearing, and in no distress  Mental status: alert, oriented to person, place, and time  Skin: warm & dry   Extremities: Edema: None    Cardiovascular: normal heart rate noted  Respiratory: normal respiratory effort, no distress  Abdomen: gravid, soft,  non-tender  Pelvic: Cervical exam deferred         Fetal Status: Fetal Heart Rate (bpm): 146   Movement: Present    Fetal Surveillance Testing today: sonogram   Chaperone: n/a      Assessment & Plan:  1) High-risk pregnancy G2P1001 at [redacted]w[redacted]d with an Estimated Date of Delivery: 06/24/20   2) Hx of PE x 2 on full dose lovenox, stable  3) AMA  4) fibroids stable  5) Hx of leukemia    Meds: No orders of the defined types were placed in this encounter.   Labs/procedures today: sonogram  Treatment Plan:  Repeat growth scan at 28 weeks  Reviewed:  labor symptoms and general obstetric precautions including but not limited to vaginal bleeding, contractions, leaking of fluid and fetal movement were reviewed in detail with the patient.  All questions were answered.  home bp cuff. Rx faxed to . Check bp weekly, let us know if >140/90.   Follow-up: Return in about 4 weeks (around 02/23/2020) for [place].  Orders Placed This Encounter  Procedures  . POC Urinalysis Dipstick OB   Florian Buff  01/26/2020 3:08 PM

## 2020-01-26 NOTE — Progress Notes (Signed)
[place] 58+3 wks,cephalic,anterior placenta gr 0,normal ovaries,cx 3.2 cm,mult fibroids (#1) fundal 3 x  3.4 x 3.1 cm,(#2) anterior retroplacental fibroid 3.3 x 1.6 x 3 cm,(#3) posterior 4.5 x 2.7 x 3.9 cm,left renal pelvic dilation 4 mm,right renal pelvis wnl 3.1 mm,fhr 161 bpm,EFW 281 g 82%,SVP of fluid 5 cm,anatomy complete

## 2020-02-23 ENCOUNTER — Encounter: Payer: Self-pay | Admitting: Women's Health

## 2020-02-23 ENCOUNTER — Ambulatory Visit (INDEPENDENT_AMBULATORY_CARE_PROVIDER_SITE_OTHER): Payer: BC Managed Care – PPO | Admitting: Women's Health

## 2020-02-23 VITALS — BP 124/75 | HR 97 | Wt 256.0 lb

## 2020-02-23 DIAGNOSIS — Z331 Pregnant state, incidental: Secondary | ICD-10-CM

## 2020-02-23 DIAGNOSIS — R030 Elevated blood-pressure reading, without diagnosis of hypertension: Secondary | ICD-10-CM

## 2020-02-23 DIAGNOSIS — Z1389 Encounter for screening for other disorder: Secondary | ICD-10-CM

## 2020-02-23 DIAGNOSIS — Z3482 Encounter for supervision of other normal pregnancy, second trimester: Secondary | ICD-10-CM

## 2020-02-23 LAB — POCT URINALYSIS DIPSTICK OB
Blood, UA: NEGATIVE
Glucose, UA: NEGATIVE
Leukocytes, UA: NEGATIVE
Nitrite, UA: NEGATIVE
POC,PROTEIN,UA: NEGATIVE

## 2020-02-23 NOTE — Patient Instructions (Addendum)
Denise Khan, I greatly value your feedback.  If you receive a survey following your visit with [place] today, we appreciate you taking the time to fill it out.  Thanks, [name], CNM, WHNP-BC   You will have your sugar test next visit.  Please do not eat or drink anything after midnight the night before you come, not even water.  You will be here for at least two hours.  Please make an appointment online for the bloodwork at [email or web address] for 8:30am (or as close to this as possible). Make sure you select the [facility] service center. The day of the appointment, check in with our office first, then you will go to [facility] [place] to start the sugar test.    [place] [facility]  [address] Entrance C, located off of [address] parking  Go to [email or web address] to register for FREE online childbirth classes   Call the office [phone]) or go to [facility] if:  You begin to have strong, frequent contractions  Your water breaks.  Sometimes it is a big gush of fluid, sometimes it is just a trickle that keeps getting your panties wet or running down your legs  You have vaginal bleeding.  It is normal to have a small amount of spotting if your cervix was checked.  You don't feel your baby moving like normal.  If you don't, get you something to eat and drink and lay down and focus on feeling your baby move.   If your baby is still not moving like normal, you should call the office or go to [facility].  Call the office [phone]) or go to [facility] hospital for these signs of pre-eclampsia:  Severe headache that does not go away with Tylenol  Visual changes- seeing spots, double, blurred vision  Pain under your right breast or upper abdomen that does not go away with Tums or heartburn medicine  Nausea and/or vomiting  Severe swelling in your hands, feet, and face     [place] Pediatricians/Family Doctors:  [place] [phone]                 [place] [phone] (usually not accepting new patients unless you have family there already, you are always welcome to call and ask)       [facility] Department [phone]       [facility] Pediatricians/Family Doctors:   [facility]: [phone]  [facility]: [place]: [place]:   [facility]: [place]: [place]:  [place]: [phone]   Home Blood Pressure Monitoring for Patients   Your provider has recommended that you check your blood pressure (BP) at least once a week at home. If you do not have a blood pressure cuff at home, one will be provided for you. Contact your provider if you have not received your monitor within 1 week.   Helpful Tips for Accurate Home Blood Pressure Checks  . Don't smoke, exercise, or drink caffeine 30 minutes before checking your BP . Use the restroom before checking your BP (a full bladder can raise your pressure) . Relax in a comfortable upright chair . Feet on the ground . Left arm resting comfortably on a flat surface at  the level of your heart . Legs uncrossed . Back supported . Sit quietly and don't talk . Place the cuff on your bare arm . Adjust snuggly, so that only two fingertips can fit between your skin and the top of the cuff . Check 2 readings separated by at least one minute . Keep a log of your BP readings . For a visual, please reference this diagram: http://ccnc.care/bpdiagram  Provider Name: [facility]     Phone: [phone]  Zone 1: ALL CLEAR  Continue to monitor your symptoms:  . BP reading is less than 140 (top number) or less than 90 (bottom number)  . No right upper stomach pain . No headaches or  seeing spots . No feeling nauseated or throwing up . No swelling in face and hands  Zone 2: CAUTION Call your doctor's office for any of the following:  . BP reading is greater than 140 (top number) or greater than 90 (bottom number)  . Stomach pain under your ribs in the middle or right side . Headaches or seeing spots . Feeling nauseated or throwing up . Swelling in face and hands  Zone 3: EMERGENCY  Seek immediate medical care if you have any of the following:  . BP reading is greater than160 (top number) or greater than 110 (bottom number) . Severe headaches not improving with Tylenol . Serious difficulty catching your breath . Any worsening symptoms from Zone 2   Second Trimester of Pregnancy The second trimester is from week 13 through week 28, months 4 through 6. The second trimester is often a time when you feel your best. Your body has also adjusted to being pregnant, and you begin to feel better physically. Usually, morning sickness has lessened or quit completely, you may have more energy, and you may have an increase in appetite. The second trimester is also a time when the fetus is growing rapidly. At the end of the sixth month, the fetus is about 9 inches long and weighs about 1 pounds. You will likely begin to feel the baby move (quickening) between 18 and 20 weeks of the pregnancy. BODY CHANGES Your body goes through many changes during pregnancy. The changes vary from woman to woman.   Your weight will continue to increase. You will notice your lower abdomen bulging out.  You may begin to get stretch marks on your hips, abdomen, and breasts.  You may develop headaches that can be relieved by medicines approved by your health care provider.  You may urinate more often because the fetus is pressing on your bladder.  You may develop or continue to have heartburn as a result of your pregnancy.  You may develop constipation because certain hormones are causing the  muscles that push waste through your intestines to slow down.  You may develop hemorrhoids or swollen, bulging veins (varicose veins).  You may have back pain because of the weight gain and pregnancy hormones relaxing your joints between the bones in your pelvis and as a result of a shift in weight and the muscles that support your balance.  Your breasts will continue to grow and be tender.  Your gums may bleed and may be sensitive to brushing and flossing.  Dark spots or blotches (chloasma, mask of pregnancy) may develop on your face. This will likely fade after the baby is born.  A dark line from your belly button to the pubic area (linea nigra) may appear. This will likely fade after the baby  is born.  You may have changes in your hair. These can include thickening of your hair, rapid growth, and changes in texture. Some women also have hair loss during or after pregnancy, or hair that feels dry or thin. Your hair will most likely return to normal after your baby is born. WHAT TO EXPECT AT YOUR PRENATAL VISITS During a routine prenatal visit:  You will be weighed to make sure you and the fetus are growing normally.  Your blood pressure will be taken.  Your abdomen will be measured to track your baby's growth.  The fetal heartbeat will be listened to.  Any test results from the previous visit will be discussed. Your health care provider may ask you:  How you are feeling.  If you are feeling the baby move.  If you have had any abnormal symptoms, such as leaking fluid, bleeding, severe headaches, or abdominal cramping.  If you have any questions. Other tests that may be performed during your second trimester include:  Blood tests that check for:  Low iron levels (anemia).  Gestational diabetes (between 24 and 28 weeks).  Rh antibodies.  Urine tests to check for infections, diabetes, or protein in the urine.  An ultrasound to confirm the proper growth and development of  the baby.  An amniocentesis to check for possible genetic problems.  Fetal screens for spina bifida and Down syndrome. HOME CARE INSTRUCTIONS   Avoid all smoking, herbs, alcohol, and unprescribed drugs. These chemicals affect the formation and growth of the baby.  Follow your health care provider's instructions regarding medicine use. There are medicines that are either safe or unsafe to take during pregnancy.  Exercise only as directed by your health care provider. Experiencing uterine cramps is a good sign to stop exercising.  Continue to eat regular, healthy meals.  Wear a good support bra for breast tenderness.  Do not use hot tubs, steam rooms, or saunas.  Wear your seat belt at all times when driving.  Avoid raw meat, uncooked cheese, cat litter boxes, and soil used by cats. These carry germs that can cause birth defects in the baby.  Take your prenatal vitamins.  Try taking a stool softener (if your health care provider approves) if you develop constipation. Eat more high-fiber foods, such as fresh vegetables or fruit and whole grains. Drink plenty of fluids to keep your urine clear or pale yellow.  Take warm sitz baths to soothe any pain or discomfort caused by hemorrhoids. Use hemorrhoid cream if your health care provider approves.  If you develop varicose veins, wear support hose. Elevate your feet for 15 minutes, 3-4 times a day. Limit salt in your diet.  Avoid heavy lifting, wear low heel shoes, and practice good posture.  Rest with your legs elevated if you have leg cramps or low back pain.  Visit your dentist if you have not gone yet during your pregnancy. Use a soft toothbrush to brush your teeth and be gentle when you floss.  A sexual relationship may be continued unless your health care provider directs you otherwise.  Continue to go to all your prenatal visits as directed by your health care provider. SEEK MEDICAL CARE IF:   You have dizziness.  You have  mild pelvic cramps, pelvic pressure, or nagging pain in the abdominal area.  You have persistent nausea, vomiting, or diarrhea.  You have a bad smelling vaginal discharge.  You have pain with urination. SEEK IMMEDIATE MEDICAL CARE IF:   You have  a fever.  You are leaking fluid from your vagina.  You have spotting or bleeding from your vagina.  You have severe abdominal cramping or pain.  You have rapid weight gain or loss.  You have shortness of breath with chest pain.  You notice sudden or extreme swelling of your face, hands, ankles, feet, or legs.  You have not felt your baby move in over an hour.  You have severe headaches that do not go away with medicine.  You have vision changes. Document Released: 06/12/2001 Document Revised: 06/23/2013 Document Reviewed: 08/19/2012 [facility] Patient Information 2015 [place], [place]. This information is not intended to replace advice given to you by your health care provider. Make sure you discuss any questions you have with your health care provider.

## 2020-02-23 NOTE — Progress Notes (Signed)
LOW-RISK PREGNANCY VISIT Patient name: Denise Khan MRN [id number]  Date of birth: 1985/11/23 Chief Complaint:   Routine Prenatal Visit  History of Present Illness:   Denise Khan is a 34 y.o. G93P1001 female at [redacted]w[redacted]d with an Estimated Date of Delivery: 06/24/20 being seen today for ongoing management of a low-risk pregnancy.  Depression screen [facility] 2/9 12/14/2019 03/10/2018 03/10/2018 05/07/2017 02/06/2017  Decreased Interest 1 2 2  0 0  Down, Depressed, Hopeless 0 2 2 0 0  PHQ - 2 Score 1 4 4  0 0  Altered sleeping 0 1 - - -  Tired, decreased energy 1 1 - - -  Change in appetite 1 2 - - -  Feeling bad or failure about yourself  0 2 - - -  Trouble concentrating 0 2 - - -  Moving slowly or fidgety/restless 0 0 - - -  Suicidal thoughts 0 0 - - -  PHQ-9 Score 3 12 - - -  Difficult doing work/chores - Somewhat difficult - - -    Today she reports no complaints. Contractions: Not present.  .  Movement: Present. denies leaking of fluid. Review of Systems:   Pertinent items are noted in HPI Denies abnormal vaginal discharge w/ itching/odor/irritation, headaches, visual changes, shortness of breath, chest pain, abdominal pain, severe nausea/vomiting, or problems with urination or bowel movements unless otherwise stated above. Pertinent History Reviewed:  Reviewed past medical,surgical, social, obstetrical and family history.  Reviewed problem list, medications and allergies. Physical Assessment:   Vitals:   02/23/20 1452  BP: 124/75  Pulse: 97  Weight: 256 lb (116.1 kg)  Body mass index is 36.73 kg/m.        Physical Examination:   General appearance: Well appearing, and in no distress  Mental status: Alert, oriented to person, place, and time  Skin: Warm & dry  Cardiovascular: Normal heart rate noted  Respiratory: Normal respiratory effort, no distress  Abdomen: Soft, gravid, nontender  Pelvic: Cervical exam deferred         Extremities: Edema: None  Fetal Status: Fetal  Heart Rate (bpm): 149 Fundal Height: 22 cm Movement: Present    Chaperone: n/a    Results for orders placed or performed in visit on 02/23/20 (from the past 24 hour(s))  POC Urinalysis Dipstick OB   Collection Time: 02/23/20  2:59 PM  Result Value Ref Range   Color, UA     Clarity, UA     Glucose, UA Negative Negative   Bilirubin, UA     Ketones, UA small    Spec Grav, UA     Blood, UA neg    pH, UA     POC,PROTEIN,UA Negative Negative, Trace, Small (1+), Moderate (2+), Large (3+), 4+   Urobilinogen, UA     Nitrite, UA neg    Leukocytes, UA Negative Negative   Appearance     Odor      Assessment & Plan:  1) Low-risk pregnancy G2P1001 at [redacted]w[redacted]d with an Estimated Date of Delivery: 06/24/20   2) H/O PE x 2, on Lovenox 100mg  BID  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  Labs/procedures today: none  Plan:  Continue routine obstetrical care  Next visit: prefers will be in person for pn2    Reviewed: Preterm labor symptoms and general obstetric precautions including but not limited to vaginal bleeding, contractions, leaking of fluid and fetal  movement were reviewed in detail with the patient.  All questions were answered. Has home bp cuff.  Check bp weekly, let us know if >140/90.   Follow-up: Return for 4wks LROB w/ PN2 in person w/ CNM.  Orders Placed This Encounter  Procedures  . POC Urinalysis Dipstick OB   Roma Schanz CNM, [facility] 02/23/2020 3:09 PM

## 2020-03-22 ENCOUNTER — Other Ambulatory Visit: Payer: BC Managed Care – PPO

## 2020-03-22 ENCOUNTER — Ambulatory Visit (INDEPENDENT_AMBULATORY_CARE_PROVIDER_SITE_OTHER): Payer: BC Managed Care – PPO | Admitting: Women's Health

## 2020-03-22 ENCOUNTER — Encounter: Payer: Self-pay | Admitting: Women's Health

## 2020-03-22 VITALS — BP 130/78 | HR 97 | Wt 255.0 lb

## 2020-03-22 DIAGNOSIS — Z23 Encounter for immunization: Secondary | ICD-10-CM | POA: Diagnosis not present

## 2020-03-22 DIAGNOSIS — Z3A26 26 weeks gestation of pregnancy: Secondary | ICD-10-CM | POA: Diagnosis not present

## 2020-03-22 DIAGNOSIS — Z3482 Encounter for supervision of other normal pregnancy, second trimester: Secondary | ICD-10-CM | POA: Diagnosis not present

## 2020-03-22 DIAGNOSIS — Z331 Pregnant state, incidental: Secondary | ICD-10-CM

## 2020-03-22 DIAGNOSIS — Z1389 Encounter for screening for other disorder: Secondary | ICD-10-CM | POA: Diagnosis not present

## 2020-03-22 DIAGNOSIS — Z131 Encounter for screening for diabetes mellitus: Secondary | ICD-10-CM

## 2020-03-22 LAB — POCT URINALYSIS DIPSTICK OB
Blood, UA: NEGATIVE
Ketones, UA: NEGATIVE
Leukocytes, UA: NEGATIVE
Nitrite, UA: NEGATIVE
POC,PROTEIN,UA: NEGATIVE

## 2020-03-22 NOTE — Patient Instructions (Signed)
Denise Khan, I greatly value your feedback.  If you receive a survey following your visit with [place] today, we appreciate you taking the time to fill it out.  Thanks, [name], CNM, WHNP-BC   [facility] [place] at [facility] ([place], [place] [address]) Entrance C, located off of [address] parking  Go to [email or web address] to register for FREE online childbirth classes   Call the office [phone]) or go to [facility] if:  You begin to have strong, frequent contractions  Your water breaks.  Sometimes it is a big gush of fluid, sometimes it is just a trickle that keeps getting your panties wet or running down your legs  You have vaginal bleeding.  It is normal to have a small amount of spotting if your cervix was checked.   You don't feel your baby moving like normal.  If you don't, get you something to eat and drink and lay down and focus on feeling your baby move.  You should feel at least 10 movements in 2 hours.  If you don't, you should call the office or go to [facility].    Tdap Vaccine  It is recommended that you get the Tdap vaccine during the third trimester of EACH pregnancy to help protect your baby from getting pertussis (whooping cough)  27-36 weeks is the BEST time to do this so that you can pass the protection on to your baby. During pregnancy is better than after pregnancy, but if you are unable to get it during pregnancy it will be offered at the hospital.   You can get this vaccine with [place], at the health department, your family doctor, or some local pharmacies  Everyone who will be around your baby should also be up-to-date on their vaccines before the baby comes. Adults (who are not pregnant) only need 1 dose of Tdap during adulthood.   [place] Pediatricians/Family Doctors:  [facility] [phone]                 [place]  [phone] (usually not accepting new patients unless you have family there already, you are always welcome to call and ask)       [facility] Department [phone]       [facility] Pediatricians/Family Doctors:   [facility]: [phone]  [facility]: [phone]  [facility]: [place] Doctors:   [facility]: [facility]: [place]:  [place]: [phone]   Home Blood Pressure Monitoring for Patients   Your provider has recommended that you check your blood pressure (BP) at least once a week at home. If you do not have a blood pressure cuff at home, one will be provided for you. Contact your provider if you have not received your monitor within 1 week.   Helpful Tips for Accurate Home Blood Pressure Checks   Don't smoke, exercise, or drink caffeine 30 minutes before checking your BP  Use the restroom before checking your BP (a full bladder can raise your pressure)  Relax in a comfortable upright chair  Feet on the ground  Left arm resting comfortably on a flat surface at the level of your heart  Legs uncrossed  Back supported  Sit quietly and don't talk  Place the cuff on your  bare arm  Adjust snuggly, so that only two fingertips can fit between your skin and the top of the cuff  Check 2 readings separated by at least one minute  Keep a log of your BP readings  For a visual, please reference this diagram: http://ccnc.care/bpdiagram  Provider Name: [facility]     Phone: [phone]  Zone 1: ALL CLEAR  Continue to monitor your symptoms:   BP reading is less than 140 (top number) or less than 90 (bottom number)   No right upper stomach pain  No headaches or seeing spots  No feeling nauseated or throwing up  No swelling in face and hands  Zone 2: CAUTION Call your  doctor's office for any of the following:   BP reading is greater than 140 (top number) or greater than 90 (bottom number)   Stomach pain under your ribs in the middle or right side  Headaches or seeing spots  Feeling nauseated or throwing up  Swelling in face and hands  Zone 3: EMERGENCY  Seek immediate medical care if you have any of the following:   BP reading is greater than160 (top number) or greater than 110 (bottom number)  Severe headaches not improving with Tylenol  Serious difficulty catching your breath  Any worsening symptoms from Zone 2   Third Trimester of Pregnancy The third trimester is from week 29 through week 42, months 7 through 9. The third trimester is a time when the fetus is growing rapidly. At the end of the ninth month, the fetus is about 20 inches in length and weighs 6-10 pounds.  BODY CHANGES Your body goes through many changes during pregnancy. The changes vary from woman to woman.   Your weight will continue to increase. You can expect to gain 25-35 pounds (11-16 kg) by the end of the pregnancy.  You may begin to get stretch marks on your hips, abdomen, and breasts.  You may urinate more often because the fetus is moving lower into your pelvis and pressing on your bladder.  You may develop or continue to have heartburn as a result of your pregnancy.  You may develop constipation because certain hormones are causing the muscles that push waste through your intestines to slow down.  You may develop hemorrhoids or swollen, bulging veins (varicose veins).  You may have pelvic pain because of the weight gain and pregnancy hormones relaxing your joints between the bones in your pelvis. Backaches may result from overexertion of the muscles supporting your posture.  You may have changes in your hair. These can include thickening of your hair, rapid growth, and changes in texture. Some women also have hair loss during or after pregnancy, or hair that  feels dry or thin. Your hair will most likely return to normal after your baby is born.  Your breasts will continue to grow and be tender. A yellow discharge may leak from your breasts called colostrum.  Your belly button may stick out.  You may feel short of breath because of your expanding uterus.  You may notice the fetus "dropping," or moving lower in your abdomen.  You may have a bloody mucus discharge. This usually occurs a few days to a week before labor begins.  Your cervix becomes thin and soft (effaced) near your due date. WHAT TO EXPECT AT YOUR PRENATAL EXAMS  You will have prenatal exams every 2 weeks until week 36. Then, you will have weekly prenatal exams. During a routine prenatal visit:  You will be weighed to make sure you and the fetus are growing normally.  Your blood pressure is taken.  Your abdomen will be measured to track your baby's growth.  The fetal heartbeat will be listened to.  Any test results from the previous visit will be discussed.  You may have a cervical check near your due date to see if you have effaced. At around 36 weeks, your caregiver will check your cervix. At the same time, your caregiver will also perform a test on the secretions of the vaginal tissue. This test is to determine if a type of bacteria, Group B streptococcus, is present. Your caregiver will explain this further. Your caregiver may ask you:  What your birth plan is.  How you are feeling.  If you are feeling the baby move.  If you have had any abnormal symptoms, such as leaking fluid, bleeding, severe headaches, or abdominal cramping.  If you have any questions. Other tests or screenings that may be performed during your third trimester include:  Blood tests that check for low iron levels (anemia).  Fetal testing to check the health, activity level, and growth of the fetus. Testing is done if you have certain medical conditions or if there are problems during the  pregnancy. FALSE LABOR You may feel small, irregular contractions that eventually go away. These are called Braxton Hicks contractions, or false labor. Contractions may last for hours, days, or even weeks before true labor sets in. If contractions come at regular intervals, intensify, or become painful, it is best to be seen by your caregiver.  SIGNS OF LABOR   Menstrual-like cramps.  Contractions that are 5 minutes apart or less.  Contractions that start on the top of the uterus and spread down to the lower abdomen and back.  A sense of increased pelvic pressure or back pain.  A watery or bloody mucus discharge that comes from the vagina. If you have any of these signs before the 37th week of pregnancy, call your caregiver right away. You need to go to the hospital to get checked immediately. HOME CARE INSTRUCTIONS   Avoid all smoking, herbs, alcohol, and unprescribed drugs. These chemicals affect the formation and growth of the baby.  Follow your caregiver's instructions regarding medicine use. There are medicines that are either safe or unsafe to take during pregnancy.  Exercise only as directed by your caregiver. Experiencing uterine cramps is a good sign to stop exercising.  Continue to eat regular, healthy meals.  Wear a good support bra for breast tenderness.  Do not use hot tubs, steam rooms, or saunas.  Wear your seat belt at all times when driving.  Avoid raw meat, uncooked cheese, cat litter boxes, and soil used by cats. These carry germs that can cause birth defects in the baby.  Take your prenatal vitamins.  Try taking a stool softener (if your caregiver approves) if you develop constipation. Eat more high-fiber foods, such as fresh vegetables or fruit and whole grains. Drink plenty of fluids to keep your urine clear or pale yellow.  Take warm sitz baths to soothe any pain or discomfort caused by hemorrhoids. Use hemorrhoid cream if your caregiver approves.  If you  develop varicose veins, wear support hose. Elevate your feet for 15 minutes, 3-4 times a day. Limit salt in your diet.  Avoid heavy lifting, wear low heal shoes, and practice good posture.  Rest a lot with your legs elevated if you have leg cramps or low  back pain.  Visit your dentist if you have not gone during your pregnancy. Use a soft toothbrush to brush your teeth and be gentle when you floss.  A sexual relationship may be continued unless your caregiver directs you otherwise.  Do not travel far distances unless it is absolutely necessary and only with the approval of your caregiver.  Take prenatal classes to understand, practice, and ask questions about the labor and delivery.  Make a trial run to the hospital.  Pack your hospital bag.  Prepare the baby's nursery.  Continue to go to all your prenatal visits as directed by your caregiver. SEEK MEDICAL CARE IF:  You are unsure if you are in labor or if your water has broken.  You have dizziness.  You have mild pelvic cramps, pelvic pressure, or nagging pain in your abdominal area.  You have persistent nausea, vomiting, or diarrhea.  You have a bad smelling vaginal discharge.  You have pain with urination. SEEK IMMEDIATE MEDICAL CARE IF:   You have a fever.  You are leaking fluid from your vagina.  You have spotting or bleeding from your vagina.  You have severe abdominal cramping or pain.  You have rapid weight loss or gain.  You have shortness of breath with chest pain.  You notice sudden or extreme swelling of your face, hands, ankles, feet, or legs.  You have not felt your baby move in over an hour.  You have severe headaches that do not go away with medicine.  You have vision changes. Document Released: 06/12/2001 Document Revised: 06/23/2013 Document Reviewed: 08/19/2012 [employer] Patient Information 2015 [place], [place]. This information is not intended to replace advice given to you by your health  care provider. Make sure you discuss any questions you have with your health care provider.

## 2020-03-22 NOTE — Progress Notes (Signed)
LOW-RISK PREGNANCY VISIT Patient name: Denise Khan MRN [id number]  Date of birth: [birth year] Chief Complaint:   Routine Prenatal Visit  History of Present Illness:   Denise Khan is a 34 y.o. G75P1001 female at [redacted]w[redacted]d with an Estimated Date of Delivery: 06/24/20 being seen today for ongoing management of a low-risk pregnancy.  Depression screen [facility] 2/9 03/22/2020 12/14/2019 03/10/2018 03/10/2018 05/07/2017  Decreased Interest 0 1 2 2  0  Down, Depressed, Hopeless 0 0 2 2 0  PHQ - 2 Score 0 1 4 4  0  Altered sleeping 0 0 1 - -  Tired, decreased energy 0 1 1 - -  Change in appetite 0 1 2 - -  Feeling bad or failure about yourself  0 0 2 - -  Trouble concentrating 0 0 2 - -  Moving slowly or fidgety/restless 0 0 0 - -  Suicidal thoughts 0 0 0 - -  PHQ-9 Score 0 3 12 - -  Difficult doing work/chores - - Somewhat difficult - -    Today she reports has to change underwear b/c they're wet multiple times during day for last 4wks. Bilateral hand/feet numbness. Contractions: Not present. Vag. Bleeding: None.  Movement: Present. reports leaking of fluid. Review of Systems:   Pertinent items are noted in HPI Denies abnormal vaginal discharge w/ itching/odor/irritation, headaches, visual changes, shortness of breath, chest pain, abdominal pain, severe nausea/vomiting, or problems with urination or bowel movements unless otherwise stated above. Pertinent History Reviewed:  Reviewed past medical,surgical, social, obstetrical and family history.  Reviewed problem list, medications and allergies. Physical Assessment:   Vitals:   03/22/20 0953  BP: 130/78  Pulse: 97  Weight: 255 lb (115.7 kg)  Body mass index is 36.59 kg/m.        Physical Examination:   General appearance: Well appearing, and in no distress  Mental status: Alert, oriented to person, place, and time  Skin: Warm & dry  Cardiovascular: Normal heart rate noted  Respiratory: Normal respiratory effort, no distress  Abdomen:  Soft, gravid, nontender  Pelvic: SSE: cx visually closed, normal nonodorous d/c, no pooling, no change w/ valsalva, fern & nitrazine neg         Extremities: Edema: None  Fetal Status:     Movement: Present    Chaperone: Amanda Rash    Results for orders placed or performed in visit on 03/22/20 (from the past 24 hour(s))  POC Urinalysis Dipstick OB   Collection Time: 03/22/20  9:56 AM  Result Value Ref Range   Color, UA     Clarity, UA     Glucose, UA Small (1+) (A) Negative   Bilirubin, UA     Ketones, UA neg    Spec Grav, UA     Blood, UA neg    pH, UA     POC,PROTEIN,UA Negative Negative, Trace, Small (1+), Moderate (2+), Large (3+), 4+   Urobilinogen, UA     Nitrite, UA neg    Leukocytes, UA Negative Negative   Appearance     Odor      Assessment & Plan:  1) Low-risk pregnancy G2P1001 at [redacted]w[redacted]d with an Estimated Date of Delivery: 06/24/20   2) Normal vaginal d/c  3) H/O PEx2> on Lovenox 100mg  BID  4) Mulitple fibroids  5) Mild fetal renal pelvic dilation> repeat u/s @ 32wks   Meds: No orders of the defined types were placed in this encounter.  Labs/procedures today: pn2, tdap, spec exam  Plan:  Continue routine obstetrical care  Next visit: prefers online    Reviewed: Preterm labor symptoms and general obstetric precautions including but not limited to vaginal bleeding, contractions, leaking of fluid and fetal movement were reviewed in detail with the patient.  All questions were answered. Has home bp cuff. Check bp weekly, let us know if >140/90.   Follow-up: Return in about 4 weeks (around 04/19/2020) for [place] [place].  Orders Placed This Encounter  Procedures  . Tdap vaccine greater than or equal to 7yo IM  . POC Urinalysis Dipstick OB   Roma Schanz CNM, [facility] 03/22/2020 10:16 AM

## 2020-03-23 ENCOUNTER — Encounter: Payer: Self-pay | Admitting: Women's Health

## 2020-03-23 DIAGNOSIS — O24419 Gestational diabetes mellitus in pregnancy, unspecified control: Secondary | ICD-10-CM | POA: Insufficient documentation

## 2020-03-23 LAB — GLUCOSE TOLERANCE, 2 HOURS W/ 1HR
Glucose, 1 hour: 219 mg/dL — ABNORMAL HIGH (ref 65–179)
Glucose, 2 hour: 93 mg/dL (ref 65–152)
Glucose, Fasting: 84 mg/dL (ref 65–91)

## 2020-03-23 LAB — CBC
Hematocrit: 35.5 % (ref 34.0–46.6)
Hemoglobin: 12.1 g/dL (ref 11.1–15.9)
MCH: 34 pg — ABNORMAL HIGH (ref 26.6–33.0)
MCHC: 34.1 g/dL (ref 31.5–35.7)
MCV: 100 fL — ABNORMAL HIGH (ref 79–97)
Platelets: 256 10*3/uL (ref 150–450)
RBC: 3.56 x10E6/uL — ABNORMAL LOW (ref 3.77–5.28)
RDW: 12 % (ref 11.7–15.4)
WBC: 7.3 10*3/uL (ref 3.4–10.8)

## 2020-03-23 LAB — HIV ANTIBODY (ROUTINE TESTING W REFLEX): HIV Screen 4th Generation wRfx: NONREACTIVE

## 2020-03-23 LAB — RPR: RPR Ser Ql: NONREACTIVE

## 2020-03-23 LAB — ANTIBODY SCREEN: Antibody Screen: NEGATIVE

## 2020-03-28 ENCOUNTER — Other Ambulatory Visit: Payer: Self-pay | Admitting: *Deleted

## 2020-03-28 DIAGNOSIS — O0990 Supervision of high risk pregnancy, unspecified, unspecified trimester: Secondary | ICD-10-CM

## 2020-03-28 DIAGNOSIS — O24410 Gestational diabetes mellitus in pregnancy, diet controlled: Secondary | ICD-10-CM

## 2020-03-28 MED ORDER — ACCU-CHEK SOFTCLIX LANCETS MISC: 12 refills | Status: DC

## 2020-03-28 MED ORDER — ACCU-CHEK GUIDE VI STRP: ORAL_STRIP | 12 refills | Status: DC

## 2020-03-28 MED ORDER — ACCU-CHEK GUIDE ME W/DEVICE KIT: 1.0000 | PACK | Freq: Four times a day (QID) | 0 refills | Status: DC

## 2020-03-29 ENCOUNTER — Telehealth: Payer: Self-pay | Admitting: *Deleted

## 2020-03-29 NOTE — Telephone Encounter (Signed)
LMOVM for patient to review mychart message from [facility] regarding abnormal glucose test.  Informed I also sent in order for diabetic supplies to her pharmacy.

## 2020-03-30 ENCOUNTER — Encounter: Payer: Self-pay | Admitting: *Deleted

## 2020-04-06 ENCOUNTER — Other Ambulatory Visit: Payer: Self-pay

## 2020-04-06 ENCOUNTER — Encounter: Payer: BC Managed Care – PPO | Attending: Obstetrics & Gynecology

## 2020-04-06 DIAGNOSIS — O24410 Gestational diabetes mellitus in pregnancy, diet controlled: Secondary | ICD-10-CM | POA: Diagnosis not present

## 2020-04-06 NOTE — Progress Notes (Signed)
Patient was seen on 04/06/20 for Gestational Diabetes self-management class at [facility]. The following learning objectives were met by the patient during this course:   States the definition of Gestational Diabetes  States why dietary management is important in controlling blood glucose  Describes the effects each nutrient has on blood glucose levels  Demonstrates ability to create a balanced meal plan  Demonstrates carbohydrate counting   States when to check blood glucose levels  Demonstrates proper blood glucose monitoring techniques  States the effect of stress and exercise on blood glucose levels  States the importance of limiting caffeine and abstaining from alcohol and smoking  Blood glucose monitor given: none. Has meter and is checking  Patient instructed to monitor glucose levels: FBS: 60 - <95; 1 hour: <140; 2 hour: <120  Patient received handouts:  Nutrition Diabetes and Pregnancy, including carb counting list  Patient will be seen for follow-up as needed.

## 2020-04-12 ENCOUNTER — Other Ambulatory Visit: Payer: BC Managed Care – PPO

## 2020-04-19 ENCOUNTER — Telehealth (INDEPENDENT_AMBULATORY_CARE_PROVIDER_SITE_OTHER): Payer: BC Managed Care – PPO | Admitting: Obstetrics and Gynecology

## 2020-04-19 ENCOUNTER — Encounter: Payer: Self-pay | Admitting: Obstetrics and Gynecology

## 2020-04-19 VITALS — BP 120/77 | HR 90

## 2020-04-19 DIAGNOSIS — Z3A30 30 weeks gestation of pregnancy: Secondary | ICD-10-CM

## 2020-04-19 DIAGNOSIS — O283 Abnormal ultrasonic finding on antenatal screening of mother: Secondary | ICD-10-CM

## 2020-04-19 DIAGNOSIS — N2889 Other specified disorders of kidney and ureter: Secondary | ICD-10-CM

## 2020-04-19 DIAGNOSIS — Z86711 Personal history of pulmonary embolism: Secondary | ICD-10-CM | POA: Insufficient documentation

## 2020-04-19 DIAGNOSIS — Z9109 Other allergy status, other than to drugs and biological substances: Secondary | ICD-10-CM

## 2020-04-19 DIAGNOSIS — O24410 Gestational diabetes mellitus in pregnancy, diet controlled: Secondary | ICD-10-CM

## 2020-04-19 DIAGNOSIS — O0990 Supervision of high risk pregnancy, unspecified, unspecified trimester: Secondary | ICD-10-CM

## 2020-04-19 NOTE — Progress Notes (Signed)
   TELEHEALTH OBSTETRICS VISIT ENCOUNTER NOTE  I connected with Triana R Goldbach on 04/19/20 at  2:10 PM EDT by telephone at home and verified that I am speaking with the correct person using two identifiers.   I discussed the limitations, risks, security and privacy concerns of performing an evaluation and management service by telephone and the availability of in person appointments. I also discussed with the patient that there may be a patient responsible charge related to this service. The patient expressed understanding and agreed to proceed.  Subjective:  Denise Khan is a 34 y.o. G2P1001 at [redacted]w[redacted]d being followed for ongoing prenatal care.  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  Patient reports general discomforts of pregnancy. Reports fetal movement. Denies any contractions, bleeding or leaking of fluid.   The following portions of the patient's history were reviewed and updated as appropriate: allergies, current medications, past family history, past medical history, past social history, past surgical history and problem list.   Objective:   General:  Alert, oriented and cooperative.   Mental Status: Normal mood and affect perceived. Normal judgment and thought content.  Rest of physical exam deferred due to type of encounter  Assessment and Plan:  Pregnancy: G2P1001 at [redacted]w[redacted]d 1. Supervision of high risk pregnancy, antepartum Stable   2. Gestational diabetes mellitus, class A1 CBG's in goal range    3. Uterine fibroids  4. History of pulmonary embolism Stable, on Lovenox  5. Dilation  of renal pelvis F/U U/S next visit   Preterm labor symptoms and general obstetric precautions including but not limited to vaginal bleeding, contractions, leaking of fluid and fetal movement were reviewed in detail with the patient.  I discussed the assessment and treatment plan with the patient. The patient was provided an opportunity to ask questions and all were answered. The patient agreed with the plan and demonstrated an understanding of the instructions. The patient was advised to call back or seek an in-person office evaluation/go to [facility] at [facility] for any urgent or concerning symptoms. Please refer to After Visit Summary for other counseling recommendations.   I provided 8 minutes of non-face-to-face time during this encounter.  Return in about 2 weeks (around 05/03/2020) for OB visit, face to face, any provider.  No future appointments.  Chancy Milroy, MD [facility] [place]

## 2020-04-19 NOTE — Addendum Note (Signed)
Addended by: Chancy Milroy on: 04/19/2020 03:13 PM   Modules accepted: Orders

## 2020-05-03 ENCOUNTER — Ambulatory Visit (INDEPENDENT_AMBULATORY_CARE_PROVIDER_SITE_OTHER): Payer: BC Managed Care – PPO | Admitting: Women's Health

## 2020-05-03 ENCOUNTER — Encounter: Payer: Self-pay | Admitting: Women's Health

## 2020-05-03 VITALS — BP 117/71 | HR 88 | Wt 265.2 lb

## 2020-05-03 DIAGNOSIS — Z3A32 32 weeks gestation of pregnancy: Secondary | ICD-10-CM

## 2020-05-03 DIAGNOSIS — O0990 Supervision of high risk pregnancy, unspecified, unspecified trimester: Secondary | ICD-10-CM

## 2020-05-03 DIAGNOSIS — O24419 Gestational diabetes mellitus in pregnancy, unspecified control: Secondary | ICD-10-CM

## 2020-05-03 DIAGNOSIS — Z1389 Encounter for screening for other disorder: Secondary | ICD-10-CM

## 2020-05-03 DIAGNOSIS — O0993 Supervision of high risk pregnancy, unspecified, third trimester: Secondary | ICD-10-CM

## 2020-05-03 DIAGNOSIS — Z331 Pregnant state, incidental: Secondary | ICD-10-CM

## 2020-05-03 LAB — POCT URINALYSIS DIPSTICK OB
Blood, UA: NEGATIVE
Glucose, UA: NEGATIVE
Leukocytes, UA: NEGATIVE
Nitrite, UA: NEGATIVE
POC,PROTEIN,UA: NEGATIVE

## 2020-05-03 MED ORDER — METFORMIN HCL 500 MG PO TABS: 500.0000 mg | ORAL_TABLET | Freq: Two times a day (BID) | ORAL | 3 refills | Status: DC

## 2020-05-03 NOTE — Progress Notes (Signed)
HIGH-RISK PREGNANCY VISIT Patient name: Denise Khan MRN [id number]  Date of birth: Mar 23, 1986 Chief Complaint:   Routine Prenatal Visit  History of Present Illness:   Denise Khan is a 34 y.o. G75P1001 female at [redacted]w[redacted]d with an Estimated Date of Delivery: 06/24/20 being seen today for ongoing management of a high-risk pregnancy complicated by diabetes mellitus A1DM.  Today she reports fbs 88-117 (8 of 14 >95), 2hr pp 73-182 (10 of 21 >120). Denies ha, visual changes, ruq/epigastric pain, n/v.   Depression screen [facility] 2/9 04/06/2020 03/22/2020 12/14/2019 03/10/2018 03/10/2018  Decreased Interest 0 0 1 2 2   Down, Depressed, Hopeless 0 0 0 2 2  PHQ - 2 Score 0 0 1 4 4   Altered sleeping - 0 0 1 -  Tired, decreased energy - 0 1 1 -  Change in appetite - 0 1 2 -  Feeling bad or failure about yourself  - 0 0 2 -  Trouble concentrating - 0 0 2 -  Moving slowly or fidgety/restless - 0 0 0 -  Suicidal thoughts - 0 0 0 -  PHQ-9 Score - 0 3 12 -  Difficult doing work/chores - - - Somewhat difficult -    Contractions: Not present. Vag. Bleeding: None.  Movement: Present. denies leaking of fluid.  Review of Systems:   Pertinent items are noted in HPI Denies abnormal vaginal discharge w/ itching/odor/irritation, headaches, visual changes, shortness of breath, chest pain, abdominal pain, severe nausea/vomiting, or problems with urination or bowel movements unless otherwise stated above. Pertinent History Reviewed:  Reviewed past medical,surgical, social, obstetrical and family history.  Reviewed problem list, medications and allergies. Physical Assessment:   Vitals:   05/03/20 1440 05/03/20 1505  BP: (!) 143/76 117/71  Pulse: 100 88  Weight: 265 lb 3.2 oz (120.3 kg)   Body mass index is 38.05 kg/m.           Physical Examination:   General appearance: alert, well appearing, and in no distress  Mental status: alert, oriented to person, place, and time  Skin: warm & dry   Extremities:  Edema: Trace    Cardiovascular: normal heart rate noted  Respiratory: normal respiratory effort, no distress  Abdomen: gravid, soft, non-tender  Pelvic: Cervical exam deferred         Fetal Status: Fetal Heart Rate (bpm): 148 Fundal Height: 32 cm Movement: Present    Fetal Surveillance Testing today: doppler   Chaperone: N/A    Results for orders placed or performed in visit on 05/03/20 (from the past 24 hour(s))  POC Urinalysis Dipstick OB   Collection Time: 05/03/20  3:07 PM  Result Value Ref Range   Color, UA     Clarity, UA     Glucose, UA Negative Negative   Bilirubin, UA     Ketones, UA mod    Spec Grav, UA     Blood, UA neg    pH, UA     POC,PROTEIN,UA Negative Negative, Trace, Small (1+), Moderate (2+), Large (3+), 4+   Urobilinogen, UA     Nitrite, UA neg    Leukocytes, UA Negative Negative   Appearance     Odor      Assessment & Plan:  1) High-risk pregnancy G2P1001 at [redacted]w[redacted]d with an Estimated Date of Delivery: 06/24/20   2) A2DM, unstable, rx metformin 500mg  BID, start 2x/wk testing  3) Elevated bp , normal on repeat, reviewed pre-e s/s, reasons to seek care  4) Multiple uterine fibroids  5) H/O PEx2> on Lovenox  6) Fetal RPD> repeat u/s scheduled today  Meds:  Meds ordered this encounter  Medications  . metFORMIN (GLUCOPHAGE) 500 MG tablet    Sig: Take 1 tablet (500 mg total) by mouth 2 (two) times daily with a meal.    Dispense:  60 tablet    Refill:  3    Order Specific Question:   Supervising Provider    Answer:   Florian Buff [[id number]]    Labs/procedures today: none  Treatment Plan:  Growth u/s q4wks     2x/wk testing @ 32wks or weekly BPP    Deliver @ 39wks:______   Reviewed: Preterm labor symptoms and general obstetric precautions including but not limited to vaginal bleeding, contractions, leaking of fluid and fetal movement were reviewed in detail with the patient.  All questions were answered. Has home bp cuff. Check bp daily, let us know  if >140/90.   Follow-up: Return for Thurs US:EFW, US:BPP & f/u kidney (no visit), then Mon nst & hrob w/ md/cnm, and Thurs nst w/ nurse.  Orders Placed This Encounter  Procedures  . US OB Follow Up  . US FETAL BPP WO NON STRESS  . POC Urinalysis Dipstick OB   Roma Schanz CNM, [facility] 05/03/2020 3:15 PM

## 2020-05-03 NOTE — Patient Instructions (Signed)
Denise Khan, I greatly value your feedback.  If you receive a survey following your visit with [place] today, we appreciate you taking the time to fill it out.  Thanks, [name], CNM, WHNP-BC  [facility] ([place], [place] [address]) Entrance C, located off of [address] parking   Go to [email or web address] to register for FREE online childbirth classes    Call the office [phone]) or go to [facility] if:  You begin to have strong, frequent contractions  Your water breaks.  Sometimes it is a big gush of fluid, sometimes it is just a trickle that keeps getting your panties wet or running down your legs  You have vaginal bleeding.  It is normal to have a small amount of spotting if your cervix was checked.   You don't feel your baby moving like normal.  If you don't, get you something to eat and drink and lay down and focus on feeling your baby move.  You should feel at least 10 movements in 2 hours.  If you don't, you should call the office or go to [facility].   Call the office [phone]) or go to [facility] hospital for these signs of pre-eclampsia:  Severe headache that does not go away with Tylenol  Visual changes- seeing spots, double, blurred vision  Pain under your right breast or upper abdomen that does not go away with Tums or heartburn medicine  Nausea and/or vomiting  Severe swelling in your hands, feet, and face    Home Blood Pressure Monitoring for Patients   Your provider has recommended that you check your blood pressure (BP) at least once a week at home. If you do not have a blood pressure cuff at home, one will be provided for you. Contact your provider if you have not received your monitor within 1 week.   Helpful Tips for Accurate Home Blood Pressure Checks  . Don't smoke, exercise, or drink caffeine 30 minutes before checking your BP . Use the restroom before checking your BP (a full  bladder can raise your pressure) . Relax in a comfortable upright chair . Feet on the ground . Left arm resting comfortably on a flat surface at the level of your heart . Legs uncrossed . Back supported . Sit quietly and don't talk . Place the cuff on your bare arm . Adjust snuggly, so that only two fingertips can fit between your skin and the top of the cuff . Check 2 readings separated by at least one minute . Keep a log of your BP readings . For a visual, please reference this diagram: http://ccnc.care/bpdiagram  Provider Name: [facility]     Phone: [phone]  Zone 1: ALL CLEAR  Continue to monitor your symptoms:  . BP reading is less than 140 (top number) or less than 90 (bottom number)  . No right upper stomach pain . No headaches or seeing spots . No feeling nauseated or throwing up . No swelling in face and hands  Zone 2: CAUTION Call your doctor's office for any of the following:  . BP reading is greater than 140 (top number) or greater than 90 (bottom number)  . Stomach pain under your ribs in the middle or right side . Headaches or seeing spots . Feeling nauseated or throwing up . Swelling in face and hands  Zone 3: EMERGENCY  Seek immediate medical care if you have any of  the following:  . BP reading is greater than160 (top number) or greater than 110 (bottom number) . Severe headaches not improving with Tylenol . Serious difficulty catching your breath . Any worsening symptoms from Zone 2  Preterm Labor and Birth Information  The normal length of a pregnancy is 39-41 weeks. Preterm labor is when labor starts before 37 completed weeks of pregnancy. What are the risk factors for preterm labor? Preterm labor is more likely to occur in women who:  Have certain infections during pregnancy such as a bladder infection, sexually transmitted infection, or infection inside the uterus (chorioamnionitis).  Have a shorter-than-normal cervix.  Have gone into  preterm labor before.  Have had surgery on their cervix.  Are younger than age 54 or older than age 25.  Are African American.  Are pregnant with twins or multiple babies (multiple gestation).  Take street drugs or smoke while pregnant.  Do not gain enough weight while pregnant.  Became pregnant shortly after having been pregnant. What are the symptoms of preterm labor? Symptoms of preterm labor include:  Cramps similar to those that can happen during a menstrual period. The cramps may happen with diarrhea.  Pain in the abdomen or lower back.  Regular uterine contractions that may feel like tightening of the abdomen.  A feeling of increased pressure in the pelvis.  Increased watery or bloody mucus discharge from the vagina.  Water breaking (ruptured amniotic sac). Why is it important to recognize signs of preterm labor? It is important to recognize signs of preterm labor because babies who are born prematurely may not be fully developed. This can put them at an increased risk for:  Long-term (chronic) heart and lung problems.  Difficulty immediately after birth with regulating body systems, including blood sugar, body temperature, heart rate, and breathing rate.  Bleeding in the brain.  Cerebral palsy.  Learning difficulties.  Death. These risks are highest for babies who are born before 48 weeks of pregnancy. How is preterm labor treated? Treatment depends on the length of your pregnancy, your condition, and the health of your baby. It may involve: 1. Having a stitch (suture) placed in your cervix to prevent your cervix from opening too early (cerclage). 2. Taking or being given medicines, such as: ? Hormone medicines. These may be given early in pregnancy to help support the pregnancy. ? Medicine to stop contractions. ? Medicines to help mature the baby's lungs. These may be prescribed if the risk of delivery is high. ? Medicines to prevent your baby from  developing cerebral palsy. If the labor happens before 34 weeks of pregnancy, you may need to stay in the hospital. What should I do if I think I am in preterm labor? If you think that you are going into preterm labor, call your health care provider right away. How can I prevent preterm labor in future pregnancies? To increase your chance of having a full-term pregnancy:  Do not use any tobacco products, such as cigarettes, chewing tobacco, and e-cigarettes. If you need help quitting, ask your health care provider.  Do not use street drugs or medicines that have not been prescribed to you during your pregnancy.  Talk with your health care provider before taking any herbal supplements, even if you have been taking them regularly.  Make sure you gain a healthy amount of weight during your pregnancy.  Watch for infection. If you think that you might have an infection, get it checked right away.  Make sure to  tell your health care provider if you have gone into preterm labor before. This information is not intended to replace advice given to you by your health care provider. Make sure you discuss any questions you have with your health care provider. Document Revised: 10/10/2018 Document Reviewed: 11/09/2015 Elsevier Patient Education  [place].

## 2020-05-05 ENCOUNTER — Ambulatory Visit (INDEPENDENT_AMBULATORY_CARE_PROVIDER_SITE_OTHER): Payer: BC Managed Care – PPO

## 2020-05-05 DIAGNOSIS — O24419 Gestational diabetes mellitus in pregnancy, unspecified control: Secondary | ICD-10-CM | POA: Diagnosis not present

## 2020-05-05 DIAGNOSIS — O0993 Supervision of high risk pregnancy, unspecified, third trimester: Secondary | ICD-10-CM

## 2020-05-05 DIAGNOSIS — O0990 Supervision of high risk pregnancy, unspecified, unspecified trimester: Secondary | ICD-10-CM

## 2020-05-05 NOTE — Progress Notes (Signed)
[place] 32+6 wks,complete breech,anterior placenta gr 0,cx 1.6 cm,afi 9.2 cm,mult fibroids N/C,fhr 135 bpm,BPP 8/8,EFW 2269 g 69%

## 2020-05-09 ENCOUNTER — Encounter: Payer: Self-pay | Admitting: Advanced Practice Midwife

## 2020-05-09 ENCOUNTER — Ambulatory Visit (INDEPENDENT_AMBULATORY_CARE_PROVIDER_SITE_OTHER): Payer: BC Managed Care – PPO | Admitting: Advanced Practice Midwife

## 2020-05-09 VITALS — BP 123/72 | HR 83 | Wt 264.0 lb

## 2020-05-09 DIAGNOSIS — Z331 Pregnant state, incidental: Secondary | ICD-10-CM

## 2020-05-09 DIAGNOSIS — O0990 Supervision of high risk pregnancy, unspecified, unspecified trimester: Secondary | ICD-10-CM | POA: Diagnosis not present

## 2020-05-09 DIAGNOSIS — Z1389 Encounter for screening for other disorder: Secondary | ICD-10-CM

## 2020-05-09 DIAGNOSIS — O24419 Gestational diabetes mellitus in pregnancy, unspecified control: Secondary | ICD-10-CM

## 2020-05-09 DIAGNOSIS — Z3A33 33 weeks gestation of pregnancy: Secondary | ICD-10-CM

## 2020-05-09 LAB — POCT URINALYSIS DIPSTICK OB
Blood, UA: NEGATIVE
Glucose, UA: NEGATIVE
Ketones, UA: NEGATIVE
Leukocytes, UA: NEGATIVE
Nitrite, UA: NEGATIVE
POC,PROTEIN,UA: NEGATIVE

## 2020-05-09 NOTE — Progress Notes (Signed)
HIGH-RISK PREGNANCY VISIT Patient name: Denise Khan MRN [id number]  Date of birth: [birth year] Chief Complaint:   Routine Prenatal Visit (NST)  History of Present Illness:   Denise Khan is a 34 y.o. G71P1001 female at [redacted]w[redacted]d with an Estimated Date of Delivery: 06/24/20 being seen today for ongoing management of a high-risk pregnancy complicated by diabetes mellitus A2DM currently on metformin 500mg  bid.  Today she reports feeling well; no complaints.   Fastings: 90s 2hr: had 2 above 120 and the rest below  Depression screen [facility] 2/9 04/06/2020 03/22/2020 12/14/2019 03/10/2018 03/10/2018  Decreased Interest 0 0 1 2 2   Down, Depressed, Hopeless 0 0 0 2 2  PHQ - 2 Score 0 0 1 4 4   Altered sleeping - 0 0 1 -  Tired, decreased energy - 0 1 1 -  Change in appetite - 0 1 2 -  Feeling bad or failure about yourself  - 0 0 2 -  Trouble concentrating - 0 0 2 -  Moving slowly or fidgety/restless - 0 0 0 -  Suicidal thoughts - 0 0 0 -  PHQ-9 Score - 0 3 12 -  Difficult doing work/chores - - - Somewhat difficult -    Contractions: Not present. Vag. Bleeding: None.  Movement: Present. denies leaking of fluid.  Review of Systems:   Pertinent items are noted in HPI Denies abnormal vaginal discharge w/ itching/odor/irritation, headaches, visual changes, shortness of breath, chest pain, abdominal pain, severe nausea/vomiting, or problems with urination or bowel movements unless otherwise stated above. Pertinent History Reviewed:  Reviewed past medical,surgical, social, obstetrical and family history.  Reviewed problem list, medications and allergies. Physical Assessment:   Vitals:   05/09/20 1511  BP: 123/72  Pulse: 83  Weight: 264 lb (119.7 kg)  Body mass index is 37.88 kg/m.           Physical Examination:   General appearance: alert, well appearing, and in no distress  Mental status: alert, oriented to person, place, and time  Skin: warm & dry   Extremities: Edema: Trace      Cardiovascular: normal heart rate noted  Respiratory: normal respiratory effort, no distress  Abdomen: gravid, soft, non-tender  Pelvic: Cervical exam deferred         Fetal Status:     Movement: Present    Fetal Surveillance Testing today: NST reactive    Results for orders placed or performed in visit on 05/09/20 (from the past 24 hour(s))  POC Urinalysis Dipstick OB   Collection Time: 05/09/20  3:14 PM  Result Value Ref Range   Color, UA     Clarity, UA     Glucose, UA Negative Negative   Bilirubin, UA     Ketones, UA neg    Spec Grav, UA     Blood, UA neg    pH, UA     POC,PROTEIN,UA Negative Negative, Trace, Small (1+), Moderate (2+), Large (3+), 4+   Urobilinogen, UA     Nitrite, UA neg    Leukocytes, UA Negative Negative   Appearance     Odor      Assessment & Plan:  1) High-risk pregnancy G2P1001 at [redacted]w[redacted]d with an Estimated Date of Delivery: 06/24/20    2) GDMA2, keep metformin 500mg  bid for now; get growth scan @ 36-37wks with IOL at 39wks; encouraged to get qid values  3) Hx PE, continue Lovenox  4) Fetal RPD, resolved on scan last week  Meds: No orders of  the defined types were placed in this encounter.   Labs/procedures today: NST  Treatment Plan:  Continue 2x/wk testing  Reviewed: Preterm labor symptoms and general obstetric precautions including but not limited to vaginal bleeding, contractions, leaking of fluid and fetal movement were reviewed in detail with the patient.  All questions were answered. Has home bp cuff. Check bp weekly, let us know if >140/90.   Follow-up: Return for NST; twice weekly- schedule out with [facility] visits.  Orders Placed This Encounter  Procedures  . POC Urinalysis Dipstick OB   Myrtis Ser [facility] 05/09/2020 3:39 PM

## 2020-05-09 NOTE — Patient Instructions (Signed)
Denise Khan, I greatly value your feedback.  If you receive a survey following your visit with [place] today, we appreciate you taking the time to fill it out.  Thanks, Derrill Memo, [place] at [facility] ([place], [place] [address]) Entrance C, located off of [address] parking  Go to [email or web address] to register for FREE online childbirth classes   Call the office [phone]) or go to [facility] if:  You begin to have strong, frequent contractions  Your water breaks.  Sometimes it is a big gush of fluid, sometimes it is just a trickle that keeps getting your panties wet or running down your legs  You have vaginal bleeding.  It is normal to have a small amount of spotting if your cervix was checked.   You don't feel your baby moving like normal.  If you don't, get you something to eat and drink and lay down and focus on feeling your baby move.  You should feel at least 10 movements in 2 hours.  If you don't, you should call the office or go to [facility].    Tdap Vaccine  It is recommended that you get the Tdap vaccine during the third trimester of EACH pregnancy to help protect your baby from getting pertussis (whooping cough)  27-36 weeks is the BEST time to do this so that you can pass the protection on to your baby. During pregnancy is better than after pregnancy, but if you are unable to get it during pregnancy it will be offered at the hospital.   You can get this vaccine with [place], at the health department, your family doctor, or some local pharmacies  Everyone who will be around your baby should also be up-to-date on their vaccines before the baby comes. Adults (who are not pregnant) only need 1 dose of Tdap during adulthood.   [place] Pediatricians/Family Doctors:  [facility] [phone]                 [place] [phone]  (usually not accepting new patients unless you have family there already, you are always welcome to call and ask)       [facility] Department [phone]       [facility] Pediatricians/Family Doctors:   [facility]: [phone]  [facility]: [phone]  [facility]: [place] Doctors:   [facility]: [place] [facility]: [place]:  [place]: [phone]   Home Blood Pressure Monitoring for Patients   Your provider has recommended that you check your blood pressure (BP) at least once a week at home. If you do not have a blood pressure cuff at home, one will be provided for you. Contact your provider if you have not received your monitor within 1 week.   Helpful Tips for Accurate Home Blood Pressure Checks  . Don't smoke, exercise, or drink caffeine 30 minutes before checking your BP . Use the restroom before checking your BP (a full bladder can raise your pressure) . Relax in a comfortable upright chair . Feet on the ground . Left arm resting comfortably on a flat surface at the level of your heart . Legs uncrossed . Back supported . Sit quietly and don't talk . Place the cuff on your bare  arm . Adjust snuggly, so that only two fingertips can fit between your skin and the top of the cuff . Check 2 readings separated by at least one minute . Keep a log of your BP readings . For a visual, please reference this diagram: http://ccnc.care/bpdiagram  Provider Name: [facility]     Phone: [phone]  Zone 1: ALL CLEAR  Continue to monitor your symptoms:  . BP reading is less than 140 (top number) or less than 90 (bottom number)  . No right upper stomach pain . No headaches or seeing spots . No feeling nauseated or throwing up . No swelling in face and hands  Zone 2: CAUTION Call your doctor's office for  any of the following:  . BP reading is greater than 140 (top number) or greater than 90 (bottom number)  . Stomach pain under your ribs in the middle or right side . Headaches or seeing spots . Feeling nauseated or throwing up . Swelling in face and hands  Zone 3: EMERGENCY  Seek immediate medical care if you have any of the following:  . BP reading is greater than160 (top number) or greater than 110 (bottom number) . Severe headaches not improving with Tylenol . Serious difficulty catching your breath . Any worsening symptoms from Zone 2   Third Trimester of Pregnancy The third trimester is from week 29 through week 42, months 7 through 9. The third trimester is a time when the fetus is growing rapidly. At the end of the ninth month, the fetus is about 20 inches in length and weighs 6-10 pounds.  BODY CHANGES Your body goes through many changes during pregnancy. The changes vary from woman to woman.   Your weight will continue to increase. You can expect to gain 25-35 pounds (11-16 kg) by the end of the pregnancy.  You may begin to get stretch marks on your hips, abdomen, and breasts.  You may urinate more often because the fetus is moving lower into your pelvis and pressing on your bladder.  You may develop or continue to have heartburn as a result of your pregnancy.  You may develop constipation because certain hormones are causing the muscles that push waste through your intestines to slow down.  You may develop hemorrhoids or swollen, bulging veins (varicose veins).  You may have pelvic pain because of the weight gain and pregnancy hormones relaxing your joints between the bones in your pelvis. Backaches may result from overexertion of the muscles supporting your posture.  You may have changes in your hair. These can include thickening of your hair, rapid growth, and changes in texture. Some women also have hair loss during or after pregnancy, or hair that feels dry or thin.  Your hair will most likely return to normal after your baby is born.  Your breasts will continue to grow and be tender. A yellow discharge may leak from your breasts called colostrum.  Your belly button may stick out.  You may feel short of breath because of your expanding uterus.  You may notice the fetus "dropping," or moving lower in your abdomen.  You may have a bloody mucus discharge. This usually occurs a few days to a week before labor begins.  Your cervix becomes thin and soft (effaced) near your due date. WHAT TO EXPECT AT YOUR PRENATAL EXAMS  You will have prenatal exams every 2 weeks until week 36. Then, you will have weekly prenatal exams. During a routine prenatal visit:  You  will be weighed to make sure you and the fetus are growing normally.  Your blood pressure is taken.  Your abdomen will be measured to track your baby's growth.  The fetal heartbeat will be listened to.  Any test results from the previous visit will be discussed.  You may have a cervical check near your due date to see if you have effaced. At around 36 weeks, your caregiver will check your cervix. At the same time, your caregiver will also perform a test on the secretions of the vaginal tissue. This test is to determine if a type of bacteria, Group B streptococcus, is present. Your caregiver will explain this further. Your caregiver may ask you:  What your birth plan is.  How you are feeling.  If you are feeling the baby move.  If you have had any abnormal symptoms, such as leaking fluid, bleeding, severe headaches, or abdominal cramping.  If you have any questions. Other tests or screenings that may be performed during your third trimester include:  Blood tests that check for low iron levels (anemia).  Fetal testing to check the health, activity level, and growth of the fetus. Testing is done if you have certain medical conditions or if there are problems during the pregnancy. FALSE  LABOR You may feel small, irregular contractions that eventually go away. These are called Braxton Hicks contractions, or false labor. Contractions may last for hours, days, or even weeks before true labor sets in. If contractions come at regular intervals, intensify, or become painful, it is best to be seen by your caregiver.  SIGNS OF LABOR   Menstrual-like cramps.  Contractions that are 5 minutes apart or less.  Contractions that start on the top of the uterus and spread down to the lower abdomen and back.  A sense of increased pelvic pressure or back pain.  A watery or bloody mucus discharge that comes from the vagina. If you have any of these signs before the 37th week of pregnancy, call your caregiver right away. You need to go to the hospital to get checked immediately. HOME CARE INSTRUCTIONS   Avoid all smoking, herbs, alcohol, and unprescribed drugs. These chemicals affect the formation and growth of the baby.  Follow your caregiver's instructions regarding medicine use. There are medicines that are either safe or unsafe to take during pregnancy.  Exercise only as directed by your caregiver. Experiencing uterine cramps is a good sign to stop exercising.  Continue to eat regular, healthy meals.  Wear a good support bra for breast tenderness.  Do not use hot tubs, steam rooms, or saunas.  Wear your seat belt at all times when driving.  Avoid raw meat, uncooked cheese, cat litter boxes, and soil used by cats. These carry germs that can cause birth defects in the baby.  Take your prenatal vitamins.  Try taking a stool softener (if your caregiver approves) if you develop constipation. Eat more high-fiber foods, such as fresh vegetables or fruit and whole grains. Drink plenty of fluids to keep your urine clear or pale yellow.  Take warm sitz baths to soothe any pain or discomfort caused by hemorrhoids. Use hemorrhoid cream if your caregiver approves.  If you develop varicose  veins, wear support hose. Elevate your feet for 15 minutes, 3-4 times a day. Limit salt in your diet.  Avoid heavy lifting, wear low heal shoes, and practice good posture.  Rest a lot with your legs elevated if you have leg cramps or low back  pain.  Visit your dentist if you have not gone during your pregnancy. Use a soft toothbrush to brush your teeth and be gentle when you floss.  A sexual relationship may be continued unless your caregiver directs you otherwise.  Do not travel far distances unless it is absolutely necessary and only with the approval of your caregiver.  Take prenatal classes to understand, practice, and ask questions about the labor and delivery.  Make a trial run to the hospital.  Pack your hospital bag.  Prepare the baby's nursery.  Continue to go to all your prenatal visits as directed by your caregiver. SEEK MEDICAL CARE IF:  You are unsure if you are in labor or if your water has broken.  You have dizziness.  You have mild pelvic cramps, pelvic pressure, or nagging pain in your abdominal area.  You have persistent nausea, vomiting, or diarrhea.  You have a bad smelling vaginal discharge.  You have pain with urination. SEEK IMMEDIATE MEDICAL CARE IF:   You have a fever.  You are leaking fluid from your vagina.  You have spotting or bleeding from your vagina.  You have severe abdominal cramping or pain.  You have rapid weight loss or gain.  You have shortness of breath with chest pain.  You notice sudden or extreme swelling of your face, hands, ankles, feet, or legs.  You have not felt your baby move in over an hour.  You have severe headaches that do not go away with medicine.  You have vision changes. Document Released: 06/12/2001 Document Revised: 06/23/2013 Document Reviewed: 08/19/2012 [employer] Patient Information 2015 [place], [place]. This information is not intended to replace advice given to you by your health care provider. Make  sure you discuss any questions you have with your health care provider.

## 2020-05-09 NOTE — Addendum Note (Signed)
Addended by: Serita Grammes D on: 05/09/2020 03:45 PM   Modules accepted: Level of Service

## 2020-05-12 ENCOUNTER — Ambulatory Visit (INDEPENDENT_AMBULATORY_CARE_PROVIDER_SITE_OTHER): Payer: Self-pay | Admitting: *Deleted

## 2020-05-12 VITALS — BP 135/85 | HR 95

## 2020-05-12 DIAGNOSIS — O24419 Gestational diabetes mellitus in pregnancy, unspecified control: Secondary | ICD-10-CM

## 2020-05-12 DIAGNOSIS — Z029 Encounter for administrative examinations, unspecified: Secondary | ICD-10-CM

## 2020-05-12 DIAGNOSIS — Z331 Pregnant state, incidental: Secondary | ICD-10-CM

## 2020-05-12 DIAGNOSIS — O0990 Supervision of high risk pregnancy, unspecified, unspecified trimester: Secondary | ICD-10-CM

## 2020-05-12 DIAGNOSIS — Z1389 Encounter for screening for other disorder: Secondary | ICD-10-CM

## 2020-05-12 LAB — POCT URINALYSIS DIPSTICK OB
Blood, UA: NEGATIVE
Glucose, UA: NEGATIVE
Leukocytes, UA: NEGATIVE
Nitrite, UA: NEGATIVE
POC,PROTEIN,UA: NEGATIVE

## 2020-05-12 NOTE — Progress Notes (Addendum)
° °  NURSE VISIT- NST  SUBJECTIVE:  Denise Khan is a 34 y.o. G2P1001 female at [redacted]w[redacted]d, here for a NST for pregnancy complicated by [id number] currently on Metformin.  She reports active fetal movement, contractions: none, vaginal bleeding: none, membranes: intact.   OBJECTIVE:  BP 135/85    Pulse 95    LMP 09/18/2019   Appears well, no apparent distress  Results for orders placed or performed in visit on 05/12/20 (from the past 24 hour(s))  POC Urinalysis Dipstick OB   Collection Time: 05/12/20  3:40 PM  Result Value Ref Range   Color, UA     Clarity, UA     Glucose, UA Negative Negative   Bilirubin, UA     Ketones, UA mod    Spec Grav, UA     Blood, UA neg    pH, UA     POC,PROTEIN,UA Negative Negative, Trace, Small (1+), Moderate (2+), Large (3+), 4+   Urobilinogen, UA     Nitrite, UA neg    Leukocytes, UA Negative Negative   Appearance     Odor      NST: FHR baseline 135 bpm, Variability: moderate, Accelerations:present, Decelerations:  Absent= Cat 1/Reactive Toco: none   ASSESSMENT: G2P1001 at [redacted]w[redacted]d with A2DM currently on Metformin NST reactive  PLAN: EFM strip reviewed by Dr. Elonda Husky   Recommendations: keep next appointment as scheduled    Alice Rieger  05/12/2020 3:50 PM   Attestation of Attending Supervision of Nursing Visit Encounter: Evaluation and management procedures were performed by the nursing staff under my supervision and collaboration.  I have reviewed the nurse's note and chart, and I agree with the management and plan.  Jacelyn Grip MD Attending Physician for the Center for [facility] 05/13/2020 7:26 AM

## 2020-05-16 ENCOUNTER — Ambulatory Visit (INDEPENDENT_AMBULATORY_CARE_PROVIDER_SITE_OTHER): Payer: BC Managed Care – PPO | Admitting: Obstetrics & Gynecology

## 2020-05-16 ENCOUNTER — Other Ambulatory Visit: Payer: Self-pay

## 2020-05-16 ENCOUNTER — Encounter: Payer: Self-pay | Admitting: Obstetrics & Gynecology

## 2020-05-16 VITALS — BP 122/78 | HR 83 | Wt 269.2 lb

## 2020-05-16 DIAGNOSIS — Z331 Pregnant state, incidental: Secondary | ICD-10-CM

## 2020-05-16 DIAGNOSIS — O0990 Supervision of high risk pregnancy, unspecified, unspecified trimester: Secondary | ICD-10-CM

## 2020-05-16 DIAGNOSIS — O24419 Gestational diabetes mellitus in pregnancy, unspecified control: Secondary | ICD-10-CM | POA: Diagnosis not present

## 2020-05-16 DIAGNOSIS — Z3A34 34 weeks gestation of pregnancy: Secondary | ICD-10-CM

## 2020-05-16 DIAGNOSIS — Z1389 Encounter for screening for other disorder: Secondary | ICD-10-CM

## 2020-05-16 LAB — POCT URINALYSIS DIPSTICK OB
Blood, UA: NEGATIVE
Glucose, UA: NEGATIVE
Ketones, UA: NEGATIVE
Leukocytes, UA: NEGATIVE
Nitrite, UA: NEGATIVE

## 2020-05-16 NOTE — Progress Notes (Signed)
HIGH-RISK PREGNANCY VISIT Patient name: Denise Khan MRN [id number]  Date of birth: 01/11/1986 Chief Complaint:   Routine Prenatal Visit (NST/room # 12)  History of Present Illness:   Denise Khan is a 34 y.o. G33P1001 female at [redacted]w[redacted]d with an Estimated Date of Delivery: 06/24/20 being seen today for ongoing management of a high-risk pregnancy complicated by gestational hypertension currently on metformin 500 BID change to 500 am 1000 pm.  Today she reports no complaints.  Depression screen [facility] 2/9 04/06/2020 03/22/2020 12/14/2019 03/10/2018 03/10/2018  Decreased Interest 0 0 1 2 2   Down, Depressed, Hopeless 0 0 0 2 2  PHQ - 2 Score 0 0 1 4 4   Altered sleeping - 0 0 1 -  Tired, decreased energy - 0 1 1 -  Change in appetite - 0 1 2 -  Feeling bad or failure about yourself  - 0 0 2 -  Trouble concentrating - 0 0 2 -  Moving slowly or fidgety/restless - 0 0 0 -  Suicidal thoughts - 0 0 0 -  PHQ-9 Score - 0 3 12 -  Difficult doing work/chores - - - Somewhat difficult -    Contractions: Not present.  .  Movement: Present. denies leaking of fluid.  Review of Systems:   Pertinent items are noted in HPI Denies abnormal vaginal discharge w/ itching/odor/irritation, headaches, visual changes, shortness of breath, chest pain, abdominal pain, severe nausea/vomiting, or problems with urination or bowel movements unless otherwise stated above. Pertinent History Reviewed:  Reviewed past medical,surgical, social, obstetrical and family history.  Reviewed problem list, medications and allergies. Physical Assessment:   Vitals:   05/16/20 1505  BP: 122/78  Pulse: 83  Weight: 269 lb 3.2 oz (122.1 kg)  Body mass index is 38.63 kg/m.           Physical Examination:   General appearance: alert, well appearing, and in no distress  Mental status: alert, oriented to person, place, and time  Skin: warm & dry   Extremities: Edema: Trace    Cardiovascular: normal heart rate noted  Respiratory:  normal respiratory effort, no distress  Abdomen: gravid, soft, non-tender  Pelvic: Cervical exam deferred         Fetal Status: Fetal Heart Rate (bpm): 140 Fundal Height: 36 cm Movement: Present    Fetal Surveillance Testing today: Reactive NST   Chaperone: N/A    Results for orders placed or performed in visit on 05/16/20 (from the past 24 hour(s))  POC Urinalysis Dipstick OB   Collection Time: 05/16/20  3:07 PM  Result Value Ref Range   Color, UA     Clarity, UA     Glucose, UA Negative Negative   Bilirubin, UA     Ketones, UA neg    Spec Grav, UA     Blood, UA neg    pH, UA     POC,PROTEIN,UA Trace Negative, Trace, Small (1+), Moderate (2+), Large (3+), 4+   Urobilinogen, UA     Nitrite, UA neg    Leukocytes, UA Negative Negative   Appearance     Odor      Assessment & Plan:  1) High-risk pregnancy G2P1001 at [redacted]w[redacted]d with an Estimated Date of Delivery: 06/24/20   2) Class A2 DM, 500 am 1000 pm  3) Hx of PE, lovenox 100 mg BID,   Meds: No orders of the defined types were placed in this encounter.   Labs/procedures today: reactive NST  Denise Khan is  at [redacted]w[redacted]d Estimated Date of Delivery: 06/24/20  NST being performed due to A2DM  Today the NST is Reactive  Fetal Monitoring:  Baseline: 140 bpm, Variability: Good {> 6 bpm), Accelerations: Reactive and Decelerations: Absent   reactive  The accelerations are >15 bpm and more than 2 in 20 minutes  Final diagnosis:  Reactive NST  Florian Buff, MD     Treatment Plan:  twie weekly surveillance, NSTs, EFW 36-37 weeks, IOL 39 weeks or as clinically indicated  Reviewed: Preterm labor symptoms and general obstetric precautions including but not limited to vaginal bleeding, contractions, leaking of fluid and fetal movement were reviewed in detail with the patient.  All questions were answered. Has home bp cuff. Rx faxed to . Check bp weekly, let us know if >140/90.   Follow-up: Return in about 3 days (around  05/19/2020) for NST, HROB.  Orders Placed This Encounter  Procedures   POC Urinalysis Dipstick OB   Denise Khan  05/16/2020 4:11 PM

## 2020-05-19 ENCOUNTER — Other Ambulatory Visit: Payer: Self-pay

## 2020-05-19 ENCOUNTER — Other Ambulatory Visit: Payer: Self-pay | Admitting: Advanced Practice Midwife

## 2020-05-19 ENCOUNTER — Ambulatory Visit (INDEPENDENT_AMBULATORY_CARE_PROVIDER_SITE_OTHER): Payer: BC Managed Care – PPO | Admitting: Advanced Practice Midwife

## 2020-05-19 VITALS — BP 132/87 | HR 86

## 2020-05-19 DIAGNOSIS — O0990 Supervision of high risk pregnancy, unspecified, unspecified trimester: Secondary | ICD-10-CM

## 2020-05-19 DIAGNOSIS — O133 Gestational [pregnancy-induced] hypertension without significant proteinuria, third trimester: Secondary | ICD-10-CM

## 2020-05-19 DIAGNOSIS — Z1389 Encounter for screening for other disorder: Secondary | ICD-10-CM

## 2020-05-19 DIAGNOSIS — O24419 Gestational diabetes mellitus in pregnancy, unspecified control: Secondary | ICD-10-CM

## 2020-05-19 DIAGNOSIS — Z3A34 34 weeks gestation of pregnancy: Secondary | ICD-10-CM | POA: Diagnosis not present

## 2020-05-19 DIAGNOSIS — O139 Gestational [pregnancy-induced] hypertension without significant proteinuria, unspecified trimester: Secondary | ICD-10-CM | POA: Insufficient documentation

## 2020-05-19 DIAGNOSIS — Z331 Pregnant state, incidental: Secondary | ICD-10-CM

## 2020-05-19 LAB — POCT URINALYSIS DIPSTICK OB
Blood, UA: NEGATIVE
Glucose, UA: NEGATIVE
Leukocytes, UA: NEGATIVE
Nitrite, UA: NEGATIVE
POC,PROTEIN,UA: NEGATIVE

## 2020-05-19 NOTE — Progress Notes (Signed)
HIGH-RISK PREGNANCY VISIT Patient name: Denise Khan MRN [id number]  Date of birth: [birth year] Chief Complaint:   Routine Prenatal Visit (NST)  History of Present Illness:   Denise Khan is a 34 y.o. G13P1001 female at [redacted]w[redacted]d with an Estimated Date of Delivery: 06/24/20 being seen today for ongoing management of a high-risk pregnancy complicated by by gestational hypertension currently on metformin 500 BID change to 500 am 1000 pm.  FBS <95 and 2hr pp 2 >120 (145/151).  Today she reports no complaints. Contractions: Not present. Vag. Bleeding: None.  Movement: Present. denies leaking of fluid.  Review of Systems:   Pertinent items are noted in HPI Denies abnormal vaginal discharge w/ itching/odor/irritation, headaches, visual changes, shortness of breath, chest pain, abdominal pain, severe nausea/vomiting, or problems with urination or bowel movements unless otherwise stated above. Pertinent History Reviewed:  Reviewed past medical,surgical, social, obstetrical and family history.  Reviewed problem list, medications and allergies. Physical Assessment:   Vitals:   05/19/20 1503 05/19/20 1504  BP: 138/90 132/87  Pulse: 87 86  There is no height or weight on file to calculate BMI.           Physical Examination:   General appearance: alert, well appearing, and in no distress  Mental status: alert, oriented to person, place, and time  Skin: warm & dry   Extremities: Edema: Trace    Cardiovascular: normal heart rate noted  Respiratory: normal respiratory effort, no distress  Abdomen: gravid, soft, non-tender  Pelvic: Cervical exam deferred         Fetal Status:     Movement: Present    Fetal Surveillance Testing today: NST: FHR baseline 145 bpm, Variability: moderate, Accelerations:present, Decelerations:  Absent= Cat 1/Reactive   Results for orders placed or performed in visit on 05/19/20 (from the past 24 hour(s))  POC Urinalysis Dipstick OB   Collection Time: 05/19/20   3:10 PM  Result Value Ref Range   Color, UA     Clarity, UA     Glucose, UA Negative Negative   Bilirubin, UA     Ketones, UA large    Spec Grav, UA     Blood, UA neg    pH, UA     POC,PROTEIN,UA Negative Negative, Trace, Small (1+), Moderate (2+), Large (3+), 4+   Urobilinogen, UA     Nitrite, UA neg    Leukocytes, UA Negative Negative   Appearance     Odor      Assessment & Plan:  1) High-risk pregnancy G2P1001 at [redacted]w[redacted]d with an Estimated Date of Delivery: 06/24/20   2) GHTN, dx today,  Treatment plan  Twice weekly testing: NST/US. IOL 37 weeks  3) A2DM, stable  Treatment plan  QID BS testing; EFW next week d/t IOL at 37 weeks  Meds: No orders of the defined types were placed in this encounter.   Labs/procedures today: NST   Reviewed: Preterm labor symptoms and general obstetric precautions including but not limited to vaginal bleeding, contractions, leaking of fluid and fetal movement were reviewed in detail with the patient.  All questions were answered. Has home bp cuff. Continue daily BPs (have all been <140/90) Follow-up: Return for will need US/BPP/Dopplers q Mon or Thurs & NST the other day (schedule for 2 more wks)..  Future Appointments  Date Time Provider [place]  05/23/2020  2:30 PM Roma Schanz, [place] [facility]  05/30/2020  2:30 PM Florian Buff, MD [facility]  06/02/2020  2:50  PM Christin Fudge, [place] [facility]  06/06/2020  2:30 PM Florian Buff, MD [facility]  06/09/2020  2:30 PM Roma Schanz, CNM [facility]  06/13/2020  2:50 PM Roma Schanz, CNM [facility]  06/16/2020  3:10 PM Christin Fudge, CNM [facility]  06/20/2020  2:30 PM Cresenzo-Dishmon, Joaquim Lai, CNM [facility]    Orders Placed This Encounter  Procedures  . POC Urinalysis Dipstick OB   Christin Fudge DNP, CNM 05/19/2020 3:32 PM;

## 2020-05-19 NOTE — Patient Instructions (Signed)

## 2020-05-23 ENCOUNTER — Encounter: Payer: Self-pay | Admitting: Women's Health

## 2020-05-23 ENCOUNTER — Ambulatory Visit (INDEPENDENT_AMBULATORY_CARE_PROVIDER_SITE_OTHER): Payer: BC Managed Care – PPO

## 2020-05-23 ENCOUNTER — Other Ambulatory Visit: Payer: BC Managed Care – PPO | Admitting: Women's Health

## 2020-05-23 ENCOUNTER — Other Ambulatory Visit: Payer: Self-pay

## 2020-05-23 ENCOUNTER — Ambulatory Visit (INDEPENDENT_AMBULATORY_CARE_PROVIDER_SITE_OTHER): Payer: BC Managed Care – PPO | Admitting: Women's Health

## 2020-05-23 VITALS — BP 134/89 | HR 77 | Wt 267.0 lb

## 2020-05-23 DIAGNOSIS — Z3A35 35 weeks gestation of pregnancy: Secondary | ICD-10-CM

## 2020-05-23 DIAGNOSIS — Z86711 Personal history of pulmonary embolism: Secondary | ICD-10-CM

## 2020-05-23 DIAGNOSIS — O133 Gestational [pregnancy-induced] hypertension without significant proteinuria, third trimester: Secondary | ICD-10-CM

## 2020-05-23 DIAGNOSIS — O0990 Supervision of high risk pregnancy, unspecified, unspecified trimester: Secondary | ICD-10-CM

## 2020-05-23 DIAGNOSIS — Z1389 Encounter for screening for other disorder: Secondary | ICD-10-CM

## 2020-05-23 DIAGNOSIS — O24419 Gestational diabetes mellitus in pregnancy, unspecified control: Secondary | ICD-10-CM

## 2020-05-23 DIAGNOSIS — O0993 Supervision of high risk pregnancy, unspecified, third trimester: Secondary | ICD-10-CM

## 2020-05-23 DIAGNOSIS — Z331 Pregnant state, incidental: Secondary | ICD-10-CM

## 2020-05-23 LAB — POCT URINALYSIS DIPSTICK OB
Blood, UA: NEGATIVE
Glucose, UA: NEGATIVE
Ketones, UA: NEGATIVE
Leukocytes, UA: NEGATIVE
Nitrite, UA: NEGATIVE
POC,PROTEIN,UA: NEGATIVE

## 2020-05-23 NOTE — Patient Instructions (Signed)
Analyn R Gift, I greatly value your feedback.  If you receive a survey following your visit with [place] today, we appreciate you taking the time to fill it out.  Thanks, [name], CNM, WHNP-BC  [facility] [place] at [facility] ([place],  [address]) Entrance C, located off of [place] parking   Go to [email or web address] to register for FREE online childbirth classes    Call the office [phone]) or go to [facility] if:  You begin to have strong, frequent contractions  Your water breaks.  Sometimes it is a big gush of fluid, sometimes it is just a trickle that keeps getting your panties wet or running down your legs  You have vaginal bleeding.  It is normal to have a small amount of spotting if your cervix was checked.   You don't feel your baby moving like normal.  If you don't, get you something to eat and drink and lay down and focus on feeling your baby move.  You should feel at least 10 movements in 2 hours.  If you don't, you should call the office or go to [facility].   Call the office [phone]) or go to [facility] hospital for these signs of pre-eclampsia:  Severe headache that does not go away with Tylenol  Visual changes- seeing spots, double, blurred vision  Pain under your right breast or upper abdomen that does not go away with Tums or heartburn medicine  Nausea and/or vomiting  Severe swelling in your hands, feet, and face    Home Blood Pressure Monitoring for Patients   Your provider has recommended that you check your blood pressure (BP) at least once a week at home. If you do not have a blood pressure cuff at home, one will be provided for you. Contact your provider if you have not received your monitor within 1 week.   Helpful Tips for Accurate Home Blood Pressure Checks  . Don't smoke, exercise, or drink caffeine 30 minutes before checking your BP . Use the restroom before checking your BP (a full  bladder can raise your pressure) . Relax in a comfortable upright chair . Feet on the ground . Left arm resting comfortably on a flat surface at the level of your heart . Legs uncrossed . Back supported . Sit quietly and don't talk . Place the cuff on your bare arm . Adjust snuggly, so that only two fingertips can fit between your skin and the top of the cuff . Check 2 readings separated by at least one minute . Keep a log of your BP readings . For a visual, please reference this diagram: http://ccnc.care/bpdiagram  Provider Name: [facility]     Phone: [phone]  Zone 1: ALL CLEAR  Continue to monitor your symptoms:  . BP reading is less than 140 (top number) or less than 90 (bottom number)  . No right upper stomach pain . No headaches or seeing spots . No feeling nauseated or throwing up . No swelling in face and hands  Zone 2: CAUTION Call your doctor's office for any of the following:  . BP reading is greater than 140 (top number) or greater than 90 (bottom number)  . Stomach pain under your ribs in the middle or right side . Headaches or seeing spots . Feeling nauseated or throwing up . Swelling in face and hands  Zone 3: EMERGENCY  Seek immediate medical care if you have any of  the following:  . BP reading is greater than160 (top number) or greater than 110 (bottom number) . Severe headaches not improving with Tylenol . Serious difficulty catching your breath . Any worsening symptoms from Zone 2  Preterm Labor and Birth Information  The normal length of a pregnancy is 39-41 weeks. Preterm labor is when labor starts before 37 completed weeks of pregnancy. What are the risk factors for preterm labor? Preterm labor is more likely to occur in women who:  Have certain infections during pregnancy such as a bladder infection, sexually transmitted infection, or infection inside the uterus (chorioamnionitis).  Have a shorter-than-normal cervix.  Have gone into  preterm labor before.  Have had surgery on their cervix.  Are younger than age 54 or older than age 25.  Are African American.  Are pregnant with twins or multiple babies (multiple gestation).  Take street drugs or smoke while pregnant.  Do not gain enough weight while pregnant.  Became pregnant shortly after having been pregnant. What are the symptoms of preterm labor? Symptoms of preterm labor include:  Cramps similar to those that can happen during a menstrual period. The cramps may happen with diarrhea.  Pain in the abdomen or lower back.  Regular uterine contractions that may feel like tightening of the abdomen.  A feeling of increased pressure in the pelvis.  Increased watery or bloody mucus discharge from the vagina.  Water breaking (ruptured amniotic sac). Why is it important to recognize signs of preterm labor? It is important to recognize signs of preterm labor because babies who are born prematurely may not be fully developed. This can put them at an increased risk for:  Long-term (chronic) heart and lung problems.  Difficulty immediately after birth with regulating body systems, including blood sugar, body temperature, heart rate, and breathing rate.  Bleeding in the brain.  Cerebral palsy.  Learning difficulties.  Death. These risks are highest for babies who are born before 48 weeks of pregnancy. How is preterm labor treated? Treatment depends on the length of your pregnancy, your condition, and the health of your baby. It may involve: 1. Having a stitch (suture) placed in your cervix to prevent your cervix from opening too early (cerclage). 2. Taking or being given medicines, such as: ? Hormone medicines. These may be given early in pregnancy to help support the pregnancy. ? Medicine to stop contractions. ? Medicines to help mature the baby's lungs. These may be prescribed if the risk of delivery is high. ? Medicines to prevent your baby from  developing cerebral palsy. If the labor happens before 34 weeks of pregnancy, you may need to stay in the hospital. What should I do if I think I am in preterm labor? If you think that you are going into preterm labor, call your health care provider right away. How can I prevent preterm labor in future pregnancies? To increase your chance of having a full-term pregnancy:  Do not use any tobacco products, such as cigarettes, chewing tobacco, and e-cigarettes. If you need help quitting, ask your health care provider.  Do not use street drugs or medicines that have not been prescribed to you during your pregnancy.  Talk with your health care provider before taking any herbal supplements, even if you have been taking them regularly.  Make sure you gain a healthy amount of weight during your pregnancy.  Watch for infection. If you think that you might have an infection, get it checked right away.  Make sure to  tell your health care provider if you have gone into preterm labor before. This information is not intended to replace advice given to you by your health care provider. Make sure you discuss any questions you have with your health care provider. Document Revised: 10/10/2018 Document Reviewed: 11/09/2015 Elsevier Patient Education  [place].

## 2020-05-23 NOTE — Progress Notes (Signed)
HIGH-RISK PREGNANCY VISIT Patient name: Denise Khan MRN [id number]  Date of birth: 1985-11-18 Chief Complaint:   Routine Prenatal Visit  History of Present Illness:   Denise Khan is a 34 y.o. G98P1001 female at [redacted]w[redacted]d with an Estimated Date of Delivery: 06/24/20 being seen today for ongoing management of a high-risk pregnancy complicated by diabetes mellitus A2DM currently on metformin 500mg AM/1,000mg PM, gestational hypertension currently on no meds and H/O PE x 2, currently on Lovenox.  Today she reports all FBS <95, only one 2hr >120 (155, sweet tea and small sub).  Depression screen [facility] 2/9 04/06/2020 03/22/2020 12/14/2019 03/10/2018 03/10/2018  Decreased Interest 0 0 1 2 2   Down, Depressed, Hopeless 0 0 0 2 2  PHQ - 2 Score 0 0 1 4 4   Altered sleeping - 0 0 1 -  Tired, decreased energy - 0 1 1 -  Change in appetite - 0 1 2 -  Feeling bad or failure about yourself  - 0 0 2 -  Trouble concentrating - 0 0 2 -  Moving slowly or fidgety/restless - 0 0 0 -  Suicidal thoughts - 0 0 0 -  PHQ-9 Score - 0 3 12 -  Difficult doing work/chores - - - Somewhat difficult -    Contractions: Not present. Vag. Bleeding: None.  Movement: Present. denies leaking of fluid.  Review of Systems:   Pertinent items are noted in HPI Denies abnormal vaginal discharge w/ itching/odor/irritation, headaches, visual changes, shortness of breath, chest pain, abdominal pain, severe nausea/vomiting, or problems with urination or bowel movements unless otherwise stated above. Pertinent History Reviewed:  Reviewed past medical,surgical, social, obstetrical and family history.  Reviewed problem list, medications and allergies. Physical Assessment:   Vitals:   05/23/20 1443  BP: 134/89  Pulse: 77  Weight: 267 lb (121.1 kg)  Body mass index is 38.31 kg/m.           Physical Examination:   General appearance: alert, well appearing, and in no distress  Mental status: alert, oriented to person, place, and  time  Skin: warm & dry   Extremities: Edema: Trace    Cardiovascular: normal heart rate noted  Respiratory: normal respiratory effort, no distress  Abdomen: gravid, soft, non-tender  Pelvic: Cervical exam deferred         Fetal Status: Fetal Heart Rate (bpm): 138 u/s   Movement: Present Presentation: Complete Breech  Fetal Surveillance Testing today: [place] 35+3 wks,complete breech,fhr 138 bpm,mult fibroids,no significant change,AFI 10 cm,enlarged right testicle with color flow 4 x 3.2 x  3 cm,EFW 2784 g 61%,anterior placenta gr 2  Chaperone: N/A    Results for orders placed or performed in visit on 05/23/20 (from the past 24 hour(s))  POC Urinalysis Dipstick OB   Collection Time: 05/23/20  2:46 PM  Result Value Ref Range   Color, UA     Clarity, UA     Glucose, UA Negative Negative   Bilirubin, UA     Ketones, UA neg    Spec Grav, UA     Blood, UA neg    pH, UA     POC,PROTEIN,UA Negative Negative, Trace, Small (1+), Moderate (2+), Large (3+), 4+   Urobilinogen, UA     Nitrite, UA neg    Leukocytes, UA Negative Negative   Appearance     Odor      Assessment & Plan:  High-risk pregnancy: G2P1001 at [redacted]w[redacted]d with an Estimated Date of Delivery: 06/24/20   1) GHTN,  stable, reviewed pre-e s/s, reasons to seek care  2) A2DM, stable on metformin 500mg AM/1,000mg PM, EFW 61% today  4) H/O PE x 2> on Lovenox  5) Fetal enlarged Rt testicle> w/ color flow, reassess after birth  6) Breech> reassess next week, [email or web address]  Meds: No orders of the defined types were placed in this encounter.   Labs/procedures today: u/s  Treatment Plan:  nst alt w/ bpp/dopp, IOL @ 37wks  Reviewed: Preterm labor symptoms and general obstetric precautions including but not limited to vaginal bleeding, contractions, leaking of fluid and fetal movement were reviewed in detail with the patient.  All questions were answered.   Follow-up: Return for As scheduled.   Future Appointments  Date Time  Provider [place]  05/30/2020  2:30 PM Florian Buff, MD [facility]  06/02/2020  2:45 PM [place] - [facility] [place] [facility] None  06/02/2020  3:50 PM Christin Fudge, CNM [facility]  06/06/2020  2:30 PM Florian Buff, MD [facility]  06/09/2020  2:30 PM [facility] - [facility] [place] [facility] None  06/09/2020  3:30 PM Roma Schanz, CNM [facility]  06/13/2020  2:50 PM Roma Schanz, CNM [facility]  06/16/2020  3:00 PM [place] - [facility] [place] [facility] None  06/16/2020  3:50 PM Christin Fudge, CNM [facility]  06/20/2020  2:30 PM Cresenzo-Dishmon, Joaquim Lai, CNM [facility]    Orders Placed This Encounter  Procedures  . POC Urinalysis Dipstick OB   Roma Schanz CNM, [facility] 05/23/2020 3:08 PM

## 2020-05-23 NOTE — Progress Notes (Signed)
[place] 35+3 wks,complete breech,fhr 138 bpm,mult fibroids,no significant change,AFI 10 cm,enlarged right testicle with color flow 4 x 3.2 x  3 cm,EFW 2784 g 61%

## 2020-05-30 ENCOUNTER — Encounter: Payer: Self-pay | Admitting: Obstetrics & Gynecology

## 2020-05-30 ENCOUNTER — Other Ambulatory Visit: Payer: Self-pay

## 2020-05-30 ENCOUNTER — Ambulatory Visit (INDEPENDENT_AMBULATORY_CARE_PROVIDER_SITE_OTHER): Payer: BC Managed Care – PPO | Admitting: Obstetrics & Gynecology

## 2020-05-30 ENCOUNTER — Other Ambulatory Visit (HOSPITAL_COMMUNITY)
Admission: RE | Admit: 2020-05-30 | Discharge: 2020-05-30 | Disposition: A | Discharge status: Home / Self Care | Payer: BC Managed Care – PPO | Source: Ambulatory Visit | Attending: Obstetrics & Gynecology | Admitting: Obstetrics & Gynecology

## 2020-05-30 VITALS — BP 135/86 | HR 98 | Wt 270.0 lb

## 2020-05-30 DIAGNOSIS — O24419 Gestational diabetes mellitus in pregnancy, unspecified control: Secondary | ICD-10-CM

## 2020-05-30 DIAGNOSIS — Z331 Pregnant state, incidental: Secondary | ICD-10-CM | POA: Diagnosis present

## 2020-05-30 DIAGNOSIS — O0990 Supervision of high risk pregnancy, unspecified, unspecified trimester: Secondary | ICD-10-CM

## 2020-05-30 DIAGNOSIS — Z3A36 36 weeks gestation of pregnancy: Secondary | ICD-10-CM | POA: Insufficient documentation

## 2020-05-30 DIAGNOSIS — Z1389 Encounter for screening for other disorder: Secondary | ICD-10-CM

## 2020-05-30 LAB — POCT URINALYSIS DIPSTICK OB
Blood, UA: NEGATIVE
Glucose, UA: NEGATIVE
Leukocytes, UA: NEGATIVE
Nitrite, UA: NEGATIVE

## 2020-05-30 NOTE — Progress Notes (Signed)
HIGH-RISK PREGNANCY VISIT Patient name: Denise Khan MRN [id number]  Date of birth: Oct 26, 1985 Chief Complaint:   Routine Prenatal Visit and Non-stress Test  History of Present Illness:   Denise Khan is a 34 y.o. 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  Today she reports no complaints.  Depression screen [facility] 2/9 04/06/2020 03/22/2020 12/14/2019 03/10/2018 03/10/2018  Decreased Interest 0 0 1 2 2   Down, Depressed, Hopeless 0 0 0 2 2  PHQ - 2 Score 0 0 1 4 4   Altered sleeping - 0 0 1 -  Tired, decreased energy - 0 1 1 -  Change in appetite - 0 1 2 -  Feeling bad or failure about yourself  - 0 0 2 -  Trouble concentrating - 0 0 2 -  Moving slowly or fidgety/restless - 0 0 0 -  Suicidal thoughts - 0 0 0 -  PHQ-9 Score - 0 3 12 -  Difficult doing work/chores - - - Somewhat difficult -    Contractions: Irritability. Vag. Bleeding: None.  Movement: Present. denies leaking of fluid.  Review of Systems:   Pertinent items are noted in HPI Denies abnormal vaginal discharge w/ itching/odor/irritation, headaches, visual changes, shortness of breath, chest pain, abdominal pain, severe nausea/vomiting, or problems with urination or bowel movements unless otherwise stated above. Pertinent History Reviewed:  Reviewed past medical,surgical, social, obstetrical and family history.  Reviewed problem list, medications and allergies. Physical Assessment:   Vitals:   05/30/20 1444  BP: 135/86  Pulse: 98  Weight: 270 lb (122.5 kg)  Body mass index is 38.74 kg/m.           Physical Examination:   General appearance: alert, well appearing, and in no distress  Mental status: alert, oriented to person, place, and time  Skin: warm & dry   Extremities: Edema: None    Cardiovascular:  normal heart rate noted  Respiratory: normal respiratory effort, no distress  Abdomen: gravid, soft, non-tender  Pelvic: Cervical exam deferred         Fetal Status:     Movement: Present    Fetal Surveillance Testing today: Reactive NST   Chaperone: Celene Squibb    Results for orders placed or performed in visit on 05/30/20 (from the past 24 hour(s))  POC Urinalysis Dipstick OB   Collection Time: 05/30/20  2:44 PM  Result Value Ref Range   Color, UA     Clarity, UA     Glucose, UA Negative Negative   Bilirubin, UA     Ketones, UA large    Spec Grav, UA     Blood, UA neg    pH, UA     POC,PROTEIN,UA Trace Negative, Trace, Small (1+), Moderate (2+), Large (3+), 4+   Urobilinogen, UA     Nitrite, UA neg    Leukocytes, UA Negative Negative   Appearance     Odor      Assessment & Plan:  High-risk pregnancy: G2P1001 at [redacted]w[redacted]d with an Estimated Date of Delivery: 06/24/20   1) Class A2 DM metformin 500/1000 CBG overall well controlled  2) Hx of PE x 2, on lovenox BID,   Meds: No orders of the defined types were placed in this encounter.   Labs/procedures today: NST  Lucyann R Vitug  is at [redacted]w[redacted]d Estimated Date of Delivery: 06/24/20  NST being performed due to A2DM  Today the NST is Reactive  Fetal Monitoring:  Baseline: 140 bpm, Variability: Good {> 6 bpm), Accelerations: Reactive and Decelerations: Absent   reactive  The accelerations are >15 bpm and more than 2 in 20 minutes  Final diagnosis:  Reactive NST  Florian Buff, MD     Treatment Plan:  Twice weekly testing  I have reviewed her BP log for the entire pregnancy and really make the argument for borderline chronic hypertesnion, not de novo pregnancy related gestational hypertension.  As a result my opinion is she should be managed more as chronic hypertensive no meds, not gestational hypertension which impacts delivery timing  Reviewed: Preterm labor symptoms and general obstetric precautions including  but not limited to vaginal bleeding, contractions, leaking of fluid and fetal movement were reviewed in detail with the patient.  All questions were answered. Has home bp cuff. Rx faxed to . Check bp weekly, let us know if >140/90.   Follow-up: No follow-ups on file.   Future Appointments  Date Time Provider [place]  06/02/2020  2:45 PM [facility] - [facility] [place] [facility] None  06/02/2020  3:50 PM Christin Fudge, CNM [facility]  06/06/2020  2:30 PM Florian Buff, MD [facility]  06/09/2020  2:30 PM [place] - [facility] [place] [facility] None  06/09/2020  3:30 PM Roma Schanz, CNM [facility]  06/13/2020  2:50 PM Roma Schanz, CNM [facility]  06/16/2020  3:00 PM [place] - [facility] [place] [facility] None  06/16/2020  3:50 PM Christin Fudge, CNM [facility]  06/20/2020  2:30 PM Cresenzo-Dishmon, Joaquim Lai, CNM [facility]    Orders Placed This Encounter  Procedures  . Culture, beta strep (group b only)  . POC Urinalysis Dipstick OB   Florian Buff  05/30/2020 3:41 PM

## 2020-06-01 ENCOUNTER — Other Ambulatory Visit: Payer: Self-pay | Admitting: Obstetrics & Gynecology

## 2020-06-01 DIAGNOSIS — D219 Benign neoplasm of connective and other soft tissue, unspecified: Secondary | ICD-10-CM

## 2020-06-01 DIAGNOSIS — O24419 Gestational diabetes mellitus in pregnancy, unspecified control: Secondary | ICD-10-CM

## 2020-06-01 LAB — CERVICOVAGINAL ANCILLARY ONLY
Chlamydia: NEGATIVE
Comment: NEGATIVE
Comment: NORMAL
Neisseria Gonorrhea: NEGATIVE

## 2020-06-02 ENCOUNTER — Inpatient Hospital Stay (HOSPITAL_COMMUNITY)
Admission: AD | Admit: 2020-06-02 | Discharge: 2020-06-05 | DRG: 788 | Disposition: A | Discharge status: Home / Self Care | Payer: BC Managed Care – PPO | Attending: Family Medicine | Admitting: Family Medicine

## 2020-06-02 ENCOUNTER — Encounter (HOSPITAL_COMMUNITY): Payer: Self-pay | Admitting: Family Medicine

## 2020-06-02 ENCOUNTER — Inpatient Hospital Stay (HOSPITAL_COMMUNITY): Payer: BC Managed Care – PPO | Admitting: Anesthesiology

## 2020-06-02 ENCOUNTER — Encounter (HOSPITAL_COMMUNITY)
Admission: AD | Disposition: A | Discharge status: Home / Self Care | Payer: Self-pay | Source: Home / Self Care | Attending: Family Medicine

## 2020-06-02 ENCOUNTER — Ambulatory Visit (INDEPENDENT_AMBULATORY_CARE_PROVIDER_SITE_OTHER): Payer: BC Managed Care – PPO

## 2020-06-02 ENCOUNTER — Other Ambulatory Visit: Payer: BC Managed Care – PPO | Admitting: Advanced Practice Midwife

## 2020-06-02 ENCOUNTER — Other Ambulatory Visit: Payer: Self-pay

## 2020-06-02 ENCOUNTER — Ambulatory Visit (INDEPENDENT_AMBULATORY_CARE_PROVIDER_SITE_OTHER): Payer: BC Managed Care – PPO | Admitting: Advanced Practice Midwife

## 2020-06-02 VITALS — BP 129/83 | HR 92 | Wt 266.5 lb

## 2020-06-02 DIAGNOSIS — Z7901 Long term (current) use of anticoagulants: Secondary | ICD-10-CM | POA: Diagnosis not present

## 2020-06-02 DIAGNOSIS — O3413 Maternal care for benign tumor of corpus uteri, third trimester: Secondary | ICD-10-CM | POA: Diagnosis present

## 2020-06-02 DIAGNOSIS — O134 Gestational [pregnancy-induced] hypertension without significant proteinuria, complicating childbirth: Secondary | ICD-10-CM | POA: Diagnosis present

## 2020-06-02 DIAGNOSIS — O133 Gestational [pregnancy-induced] hypertension without significant proteinuria, third trimester: Secondary | ICD-10-CM

## 2020-06-02 DIAGNOSIS — Z86711 Personal history of pulmonary embolism: Secondary | ICD-10-CM | POA: Diagnosis not present

## 2020-06-02 DIAGNOSIS — O24425 Gestational diabetes mellitus in childbirth, controlled by oral hypoglycemic drugs: Secondary | ICD-10-CM | POA: Diagnosis present

## 2020-06-02 DIAGNOSIS — O99214 Obesity complicating childbirth: Secondary | ICD-10-CM | POA: Diagnosis present

## 2020-06-02 DIAGNOSIS — O0990 Supervision of high risk pregnancy, unspecified, unspecified trimester: Secondary | ICD-10-CM

## 2020-06-02 DIAGNOSIS — O321XX0 Maternal care for breech presentation, not applicable or unspecified: Principal | ICD-10-CM | POA: Diagnosis present

## 2020-06-02 DIAGNOSIS — A599 Trichomoniasis, unspecified: Secondary | ICD-10-CM | POA: Diagnosis present

## 2020-06-02 DIAGNOSIS — D219 Benign neoplasm of connective and other soft tissue, unspecified: Secondary | ICD-10-CM

## 2020-06-02 DIAGNOSIS — E669 Obesity, unspecified: Secondary | ICD-10-CM | POA: Diagnosis present

## 2020-06-02 DIAGNOSIS — O24419 Gestational diabetes mellitus in pregnancy, unspecified control: Secondary | ICD-10-CM

## 2020-06-02 DIAGNOSIS — Z20822 Contact with and (suspected) exposure to covid-19: Secondary | ICD-10-CM | POA: Diagnosis present

## 2020-06-02 DIAGNOSIS — Z3A36 36 weeks gestation of pregnancy: Secondary | ICD-10-CM

## 2020-06-02 DIAGNOSIS — O139 Gestational [pregnancy-induced] hypertension without significant proteinuria, unspecified trimester: Secondary | ICD-10-CM | POA: Diagnosis present

## 2020-06-02 DIAGNOSIS — Z1389 Encounter for screening for other disorder: Secondary | ICD-10-CM

## 2020-06-02 DIAGNOSIS — C9200 Acute myeloblastic leukemia, not having achieved remission: Secondary | ICD-10-CM | POA: Diagnosis present

## 2020-06-02 DIAGNOSIS — Z6838 Body mass index (BMI) 38.0-38.9, adult: Secondary | ICD-10-CM

## 2020-06-02 DIAGNOSIS — D259 Leiomyoma of uterus, unspecified: Secondary | ICD-10-CM | POA: Diagnosis present

## 2020-06-02 DIAGNOSIS — Z331 Pregnant state, incidental: Secondary | ICD-10-CM

## 2020-06-02 HISTORY — DX: Maternal care for breech presentation, not applicable or unspecified: O32.1XX0

## 2020-06-02 LAB — POCT URINALYSIS DIPSTICK OB
Glucose, UA: NEGATIVE
Leukocytes, UA: NEGATIVE
Nitrite, UA: NEGATIVE

## 2020-06-02 LAB — CBC
HCT: 37.7 % (ref 36.0–46.0)
Hemoglobin: 13.3 g/dL (ref 12.0–15.0)
MCH: 34 pg (ref 26.0–34.0)
MCHC: 35.3 g/dL (ref 30.0–36.0)
MCV: 96.4 fL (ref 80.0–100.0)
Platelets: 266 10*3/uL (ref 150–400)
RBC: 3.91 MIL/uL (ref 3.87–5.11)
RDW: 13.3 % (ref 11.5–15.5)
WBC: 10.5 10*3/uL (ref 4.0–10.5)
nRBC: 0 % (ref 0.0–0.2)

## 2020-06-02 LAB — PROTEIN / CREATININE RATIO, URINE
Creatinine, Urine: 152.31 mg/dL
Protein Creatinine Ratio: 0.12 mg/mg{Cre} (ref 0.00–0.15)
Total Protein, Urine: 19 mg/dL

## 2020-06-02 LAB — COMPREHENSIVE METABOLIC PANEL
ALT: 28 U/L (ref 0–44)
AST: 22 U/L (ref 15–41)
Albumin: 2.9 g/dL — ABNORMAL LOW (ref 3.5–5.0)
Alkaline Phosphatase: 129 U/L — ABNORMAL HIGH (ref 38–126)
Anion gap: 10 (ref 5–15)
BUN: 7 mg/dL (ref 6–20)
CO2: 20 mmol/L — ABNORMAL LOW (ref 22–32)
Calcium: 9.6 mg/dL (ref 8.9–10.3)
Chloride: 105 mmol/L (ref 98–111)
Creatinine, Ser: 0.84 mg/dL (ref 0.44–1.00)
GFR, Estimated: >60 mL/min (ref >60)
Glucose, Bld: 69 mg/dL — ABNORMAL LOW (ref 70–99)
Potassium: 4 mmol/L (ref 3.5–5.1)
Sodium: 135 mmol/L (ref 135–145)
Total Bilirubin: 0.4 mg/dL (ref 0.3–1.2)
Total Protein: 6.4 g/dL — ABNORMAL LOW (ref 6.5–8.1)

## 2020-06-02 LAB — TYPE AND SCREEN
ABO/RH(D): B POS
Antibody Screen: NEGATIVE

## 2020-06-02 LAB — RESP PANEL BY RT-PCR (FLU A&B, COVID) ARPGX2
Influenza A by PCR: NEGATIVE
Influenza B by PCR: NEGATIVE
SARS Coronavirus 2 by RT PCR: NEGATIVE

## 2020-06-02 LAB — GLUCOSE, CAPILLARY
Glucose-Capillary: 67 mg/dL — ABNORMAL LOW (ref 70–99)
Glucose-Capillary: 87 mg/dL (ref 70–99)

## 2020-06-02 SURGERY — Surgical Case
Anesthesia: Spinal

## 2020-06-02 MED ORDER — DIPHENHYDRAMINE HCL 25 MG PO CAPS: 25.0000 mg | ORAL_CAPSULE | ORAL | Status: DC | PRN

## 2020-06-02 MED ORDER — FENTANYL CITRATE (PF) 100 MCG/2ML IJ SOLN
INTRAMUSCULAR | Status: AC
  Filled 2020-06-02: qty 2

## 2020-06-02 MED ORDER — DEXTROSE 5 % IV SOLN
INTRAVENOUS | Status: DC | PRN
  Administered 2020-06-02: 3 g via INTRAVENOUS

## 2020-06-02 MED ORDER — PHENYLEPHRINE HCL-NACL 20-0.9 MG/250ML-% IV SOLN
INTRAVENOUS | Status: AC
  Filled 2020-06-02: qty 250

## 2020-06-02 MED ORDER — DIPHENHYDRAMINE HCL 50 MG/ML IJ SOLN: 12.5000 mg | INTRAMUSCULAR | Status: DC | PRN

## 2020-06-02 MED ORDER — PROMETHAZINE HCL 25 MG/ML IJ SOLN: 6.2500 mg | INTRAMUSCULAR | Status: DC | PRN

## 2020-06-02 MED ORDER — SOD CITRATE-CITRIC ACID 500-334 MG/5ML PO SOLN
30.0000 mL | ORAL | Status: AC
  Administered 2020-06-02: 30 mL via ORAL

## 2020-06-02 MED ORDER — BUPIVACAINE IN DEXTROSE 0.75-8.25 % IT SOLN
INTRATHECAL | Status: DC | PRN
  Administered 2020-06-02: 2 mL via INTRATHECAL

## 2020-06-02 MED ORDER — KETOROLAC TROMETHAMINE 30 MG/ML IJ SOLN: 30.0000 mg | Freq: Four times a day (QID) | INTRAMUSCULAR | Status: AC | PRN

## 2020-06-02 MED ORDER — SOD CITRATE-CITRIC ACID 500-334 MG/5ML PO SOLN
ORAL | Status: AC
  Filled 2020-06-02: qty 30

## 2020-06-02 MED ORDER — KETOROLAC TROMETHAMINE 30 MG/ML IJ SOLN
30.0000 mg | Freq: Four times a day (QID) | INTRAMUSCULAR | Status: AC | PRN
  Administered 2020-06-02: 30 mg via INTRAMUSCULAR

## 2020-06-02 MED ORDER — ONDANSETRON HCL 4 MG/2ML IJ SOLN
INTRAMUSCULAR | Status: AC
  Filled 2020-06-02: qty 2

## 2020-06-02 MED ORDER — ONDANSETRON HCL 4 MG/2ML IJ SOLN: 4.0000 mg | Freq: Three times a day (TID) | INTRAMUSCULAR | Status: DC | PRN

## 2020-06-02 MED ORDER — PHENYLEPHRINE HCL-NACL 20-0.9 MG/250ML-% IV SOLN
INTRAVENOUS | Status: DC | PRN
  Administered 2020-06-02: 60 ug/min via INTRAVENOUS

## 2020-06-02 MED ORDER — MEPERIDINE HCL 25 MG/ML IJ SOLN: 6.2500 mg | INTRAMUSCULAR | Status: DC | PRN

## 2020-06-02 MED ORDER — KETOROLAC TROMETHAMINE 30 MG/ML IJ SOLN: 30.0000 mg | Freq: Once | INTRAMUSCULAR | Status: DC | PRN

## 2020-06-02 MED ORDER — NALBUPHINE HCL 10 MG/ML IJ SOLN: 5.0000 mg | Freq: Once | INTRAMUSCULAR | Status: DC | PRN

## 2020-06-02 MED ORDER — PHENYLEPHRINE 40 MCG/ML (10ML) SYRINGE FOR IV PUSH (FOR BLOOD PRESSURE SUPPORT)
PREFILLED_SYRINGE | INTRAVENOUS | Status: AC
  Filled 2020-06-02: qty 10

## 2020-06-02 MED ORDER — KETOROLAC TROMETHAMINE 30 MG/ML IJ SOLN
INTRAMUSCULAR | Status: AC
  Filled 2020-06-02: qty 1

## 2020-06-02 MED ORDER — ACETAMINOPHEN 500 MG PO TABS
1000.0000 mg | ORAL_TABLET | Freq: Once | ORAL | Status: AC
  Administered 2020-06-02: 1000 mg via ORAL
  Filled 2020-06-02: qty 2

## 2020-06-02 MED ORDER — FENTANYL CITRATE (PF) 100 MCG/2ML IJ SOLN
INTRAMUSCULAR | Status: DC | PRN
  Administered 2020-06-02: 15 ug via INTRATHECAL

## 2020-06-02 MED ORDER — OXYCODONE HCL 5 MG PO TABS: 5.0000 mg | ORAL_TABLET | Freq: Once | ORAL | Status: DC | PRN

## 2020-06-02 MED ORDER — TERBUTALINE SULFATE 1 MG/ML IJ SOLN: 0.2500 mg | Freq: Once | INTRAMUSCULAR | Status: DC

## 2020-06-02 MED ORDER — TERBUTALINE SULFATE 1 MG/ML IJ SOLN
INTRAMUSCULAR | Status: AC
  Filled 2020-06-02: qty 1

## 2020-06-02 MED ORDER — NALOXONE HCL 0.4 MG/ML IJ SOLN: 0.4000 mg | INTRAMUSCULAR | Status: DC | PRN

## 2020-06-02 MED ORDER — SCOPOLAMINE 1 MG/3DAYS TD PT72
1.0000 | MEDICATED_PATCH | Freq: Once | TRANSDERMAL | Status: DC
  Administered 2020-06-02: 1.5 mg via TRANSDERMAL

## 2020-06-02 MED ORDER — NALBUPHINE HCL 10 MG/ML IJ SOLN: 5.0000 mg | INTRAMUSCULAR | Status: DC | PRN

## 2020-06-02 MED ORDER — PHENYLEPHRINE HCL (PRESSORS) 10 MG/ML IV SOLN
INTRAVENOUS | Status: DC | PRN
  Administered 2020-06-02 (×3): 80 ug via INTRAVENOUS

## 2020-06-02 MED ORDER — HYDROMORPHONE HCL 1 MG/ML IJ SOLN: 0.2500 mg | INTRAMUSCULAR | Status: DC | PRN

## 2020-06-02 MED ORDER — NALOXONE HCL 4 MG/10ML IJ SOLN
1.0000 ug/kg/h | INTRAVENOUS | Status: DC | PRN
  Filled 2020-06-02: qty 5

## 2020-06-02 MED ORDER — ACETAMINOPHEN 500 MG PO TABS
1000.0000 mg | ORAL_TABLET | Freq: Four times a day (QID) | ORAL | Status: AC
  Administered 2020-06-03 (×3): 1000 mg via ORAL
  Filled 2020-06-02 (×3): qty 2

## 2020-06-02 MED ORDER — LACTATED RINGERS IV SOLN: INTRAVENOUS | Status: DC | PRN

## 2020-06-02 MED ORDER — DEXTROSE 5 % IV SOLN: 3.0000 g | INTRAVENOUS | Status: DC

## 2020-06-02 MED ORDER — ONDANSETRON HCL 4 MG/2ML IJ SOLN
INTRAMUSCULAR | Status: DC | PRN
  Administered 2020-06-02: 4 mg via INTRAVENOUS

## 2020-06-02 MED ORDER — MORPHINE SULFATE (PF) 0.5 MG/ML IJ SOLN
INTRAMUSCULAR | Status: AC
  Filled 2020-06-02: qty 10

## 2020-06-02 MED ORDER — DEXTROSE 5 % IV SOLN
INTRAVENOUS | Status: AC
  Filled 2020-06-02: qty 3000

## 2020-06-02 MED ORDER — BUPIVACAINE HCL (PF) 0.25 % IJ SOLN
INTRAMUSCULAR | Status: AC
  Filled 2020-06-02: qty 30

## 2020-06-02 MED ORDER — TERBUTALINE SULFATE 1 MG/ML IJ SOLN
0.2500 mg | Freq: Once | INTRAMUSCULAR | Status: AC
  Administered 2020-06-02: 0.25 mg via SUBCUTANEOUS
  Filled 2020-06-02: qty 1

## 2020-06-02 MED ORDER — OXYCODONE HCL 5 MG/5ML PO SOLN: 5.0000 mg | Freq: Once | ORAL | Status: DC | PRN

## 2020-06-02 MED ORDER — OXYTOCIN-SODIUM CHLORIDE 30-0.9 UT/500ML-% IV SOLN
INTRAVENOUS | Status: DC | PRN
  Administered 2020-06-02: 30 [IU] via INTRAVENOUS

## 2020-06-02 MED ORDER — MORPHINE SULFATE (PF) 0.5 MG/ML IJ SOLN
INTRAMUSCULAR | Status: DC | PRN
  Administered 2020-06-02: .15 ug via INTRATHECAL

## 2020-06-02 MED ORDER — SODIUM CHLORIDE 0.9% FLUSH: 3.0000 mL | INTRAVENOUS | Status: DC | PRN

## 2020-06-02 MED ORDER — SCOPOLAMINE 1 MG/3DAYS TD PT72
MEDICATED_PATCH | TRANSDERMAL | Status: AC
  Filled 2020-06-02: qty 1

## 2020-06-02 SURGICAL SUPPLY — 34 items
BENZOIN TINCTURE PRP APPL 2/3 (GAUZE/BANDAGES/DRESSINGS) ×3 IMPLANT
CLAMP CORD UMBIL (MISCELLANEOUS) ×3 IMPLANT
CLOSURE STERI STRIP 1/2 X4 (GAUZE/BANDAGES/DRESSINGS) ×6 IMPLANT
CLOSURE WOUND 1/2 X4 (GAUZE/BANDAGES/DRESSINGS) ×1
CLOTH BEACON ORANGE TIMEOUT ST (SAFETY) ×3 IMPLANT
DRSG OPSITE POSTOP 4X10 (GAUZE/BANDAGES/DRESSINGS) ×3 IMPLANT
ELECT REM PT RETURN 9FT ADLT (ELECTROSURGICAL) ×3
ELECTRODE REM PT RTRN 9FT ADLT (ELECTROSURGICAL) ×1 IMPLANT
EXTRACTOR VACUUM M CUP 4 TUBE (SUCTIONS) IMPLANT
EXTRACTOR VACUUM M CUP 4' TUBE (SUCTIONS)
GLOVE BIOGEL PI IND STRL 7.0 (GLOVE) ×3 IMPLANT
GLOVE BIOGEL PI INDICATOR 7.0 (GLOVE) ×6
GLOVE ECLIPSE 7.0 STRL STRAW (GLOVE) ×3 IMPLANT
GOWN STRL REUS W/TWL LRG LVL3 (GOWN DISPOSABLE) ×6 IMPLANT
KIT ABG SYR 3ML LUER SLIP (SYRINGE) ×3 IMPLANT
NEEDLE HYPO 22GX1.5 SAFETY (NEEDLE) ×3 IMPLANT
NEEDLE HYPO 25X5/8 SAFETYGLIDE (NEEDLE) ×3 IMPLANT
NS IRRIG 1000ML POUR BTL (IV SOLUTION) ×3 IMPLANT
PACK C SECTION WH (CUSTOM PROCEDURE TRAY) ×3 IMPLANT
PAD ABD 7.5X8 STRL (GAUZE/BANDAGES/DRESSINGS) ×3 IMPLANT
PAD OB MATERNITY 4.3X12.25 (PERSONAL CARE ITEMS) ×3 IMPLANT
PENCIL SMOKE EVAC W/HOLSTER (ELECTROSURGICAL) ×3 IMPLANT
RTRCTR C-SECT PINK 25CM LRG (MISCELLANEOUS) ×3 IMPLANT
SPONGE GAUZE 4X4 12PLY STER LF (GAUZE/BANDAGES/DRESSINGS) ×6 IMPLANT
STRIP CLOSURE SKIN 1/2X4 (GAUZE/BANDAGES/DRESSINGS) ×2 IMPLANT
SUT MNCRL 0 VIOLET CTX 36 (SUTURE) ×2 IMPLANT
SUT MONOCRYL 0 CTX 36 (SUTURE) ×4
SUT VIC AB 0 CTX 36 (SUTURE) ×2
SUT VIC AB 0 CTX36XBRD ANBCTRL (SUTURE) ×1 IMPLANT
SUT VIC AB 4-0 KS 27 (SUTURE) ×3 IMPLANT
SYR 30ML LL (SYRINGE) ×3 IMPLANT
TOWEL OR 17X24 6PK STRL BLUE (TOWEL DISPOSABLE) ×3 IMPLANT
TRAY FOLEY W/BAG SLVR 14FR LF (SET/KITS/TRAYS/PACK) ×3 IMPLANT
WATER STERILE IRR 1000ML POUR (IV SOLUTION) ×6 IMPLANT

## 2020-06-02 NOTE — Transfer of Care (Signed)
Immediate Anesthesia Transfer of Care Note  Patient: Denise Khan  Procedure(s) Performed: CESAREAN SECTION  Patient Location: PACU  Anesthesia Type:Spinal  Level of Consciousness: awake  Airway & Oxygen Therapy: Patient Spontanous Breathing  Post-op Assessment: Report given to RN and Post -op Vital signs reviewed and stable  Post vital signs: Reviewed and stable  Last Vitals:  Vitals Value Taken Time  BP 115/58 06/02/20 2235  Temp    Pulse 99 06/02/20 2237  Resp 14 06/02/20 2237  SpO2 99 % 06/02/20 2237  Vitals shown include unvalidated device data.  Last Pain:  Vitals:   06/02/20 1946  TempSrc:   PainSc: 4          Complications: No complications documented.

## 2020-06-02 NOTE — Anesthesia Preprocedure Evaluation (Signed)
Anesthesia Evaluation  Patient identified by MRN, date of birth, ID band Patient awake    Reviewed: Allergy & Precautions, Patient's Chart, lab work & pertinent test results  Airway Mallampati: II  TM Distance: >3 FB Neck ROM: Full    Dental no notable dental hx.    Pulmonary PE Hx PE, currently on lovenox- last dose <24h ago   Pulmonary exam normal breath sounds clear to auscultation       Cardiovascular hypertension, Normal cardiovascular exam Rhythm:Regular Rate:Normal     Neuro/Psych PSYCHIATRIC DISORDERS Anxiety negative neurological ROS     GI/Hepatic negative GI ROS, Neg liver ROS,   Endo/Other  diabetes, Gestational, Oral Hypoglycemic AgentsObesity BMI 38  Renal/GU negative Renal ROS  negative genitourinary   Musculoskeletal negative musculoskeletal ROS (+)   Abdominal (+) + obese,   Peds negative pediatric ROS (+)  Hematology negative hematology ROS (+) hct 37.7, plt 266   Anesthesia Other Findings   Reproductive/Obstetrics (+) Pregnancy G2P1 breech presentation, arrived to MAU in labor and dilated to 6cm                             Anesthesia Physical Anesthesia Plan  ASA: III  Anesthesia Plan: Spinal   Post-op Pain Management:    Induction:   PONV Risk Score and Plan: 2 and Propofol infusion and TIVA  Airway Management Planned: Natural Airway and Nasal Cannula  Additional Equipment: None  Intra-op Plan:   Post-operative Plan:   Informed Consent: I have reviewed the patients History and Physical, chart, labs and discussed the procedure including the risks, benefits and alternatives for the proposed anesthesia with the patient or authorized representative who has indicated his/her understanding and acceptance.       Plan Discussed with: CRNA  Anesthesia Plan Comments: (Plan per OB and pt is to attempt version and if unsuccessful proceed with c section. D/w  patient her options, Dr. Kennon Rounds declined epidural for patient prior to version.)        Anesthesia Quick Evaluation

## 2020-06-02 NOTE — Discharge Summary (Signed)
Postpartum Discharge Summary  Patient Name: Denise Khan DOB: 05/27/1986 MRN: [id number]  Date of admission: 06/02/2020 Delivery date:06/02/2020  Delivering provider: Donnamae Jude  Date of discharge: 06/05/2020  Admitting diagnosis: Breech presentation [O32.1XX0] Cesarean delivery delivered [O82] Intrauterine pregnancy: [redacted]w[redacted]d    Secondary diagnosis:  Active Problems:   History of pulmonary embolus (PE)   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   Uterine fibroid   BMI 38.0-38.9,adult   Trichomonas infection   Supervision of high risk pregnancy, antepartum   Gestational diabetes mellitus, class A2   Gestational hypertension   Complete breech presentation   Breech presentation   Cesarean delivery delivered  Additional problems: none    Discharge diagnosis: Preterm Pregnancy Delivered and elevated BP                                  Post partum procedures:n/a Augmentation: N/A Complications: None  Hospital course: Induction of Labor With Cesarean Section   34y.o. yo G2P1001 at 363w6das admitted to the hospital 06/02/2020 for induction of labor. Patient presented in active labor and fetus was found to be in breech presentation. Patient was offered a breech delivery, which she declined. Attempted ECV which was unsuccessful.  The patient went for cesarean section due to Malpresentation. Delivery details are as follows: Membrane Rupture Time/Date: 9:46 PM ,06/02/2020   Delivery Method:C-Section, Low Transverse  Details of operation can be found in separate operative Note.  Patient had an uncomplicated postpartum course. She is ambulating, tolerating a regular diet, passing flatus, and urinating well.  Patient is discharged home in stable condition on 06/05/20.      Newborn Data: Birth date:06/02/2020  Birth time:9:47 PM  Gender:Female  Living status:Living  Apgars:3 ,9  Weight:3249 g                                 Magnesium Sulfate received: No BMZ received:  No Rhophylac:N/A MMR:N/A T-DaP:Given prenatally Flu: No Transfusion:No  Physical exam  Vitals:   06/04/20 2038 06/05/20 0647 06/05/20 0807 06/05/20 0813  BP:  (!) 152/96 (!) 146/92 (!) 146/92  Pulse: 94 84 72   Resp: '18 18 18   ' Temp: 98.4 F (36.9 C)     TempSrc: Oral Oral    SpO2: 100% 100%    Weight:      Height:       General: alert, cooperative and no distress Lochia: appropriate Uterine Fundus: firm Incision: Healing well with no significant drainage, Dressing is clean, dry, and intact DVT Evaluation: No evidence of DVT seen on physical exam. Labs: Lab Results  Component Value Date   WBC 11.6 (H) 06/03/2020   HGB 11.3 (L) 06/03/2020   HCT 33.7 (L) 06/03/2020   MCV 99.4 06/03/2020   PLT 220 06/03/2020   CMP Latest Ref Rng & Units 06/02/2020  Glucose 70 - 99 mg/dL 69(L)  BUN 6 - 20 mg/dL 7  Creatinine 0.44 - 1.00 mg/dL 0.84  Sodium 135 - 145 mmol/L 135  Potassium 3.5 - 5.1 mmol/L 4.0  Chloride 98 - 111 mmol/L 105  CO2 22 - 32 mmol/L 20(L)  Calcium 8.9 - 10.3 mg/dL 9.6  Total Protein 6.5 - 8.1 g/dL 6.4(L)  Total Bilirubin 0.3 - 1.2 mg/dL 0.4  Alkaline Phos 38 - 126 U/L 129(H)  AST 15 - 41 U/L 22  ALT 0 - 44 U/L 28   Edinburgh Score: Edinburgh Postnatal Depression Scale Screening Tool 06/03/2020  I have been able to laugh and see the funny side of things. 0  I have looked forward with enjoyment to things. 0  I have blamed myself unnecessarily when things went wrong. 0  I have been anxious or worried for no good reason. 0  I have felt scared or panicky for no good reason. 0  Things have been getting on top of me. 0  I have been so unhappy that I have had difficulty sleeping. 0  I have felt sad or miserable. 0  I have been so unhappy that I have been crying. 0  The thought of harming myself has occurred to me. 0  Edinburgh Postnatal Depression Scale Total 0     After visit meds:  Allergies as of 06/05/2020   No Known Allergies     Medication List     STOP taking these medications   Accu-Chek Guide Me w/Device Kit   Accu-Chek Guide test strip Generic drug: glucose blood   Accu-Chek Softclix Lancets lancets   enoxaparin 100 MG/ML injection Commonly known as: LOVENOX Replaced by: enoxaparin 120 MG/0.8ML injection   metFORMIN 500 MG tablet Commonly known as: GLUCOPHAGE   PRENATAL VITAMIN PO     TAKE these medications   cetirizine 10 MG tablet Commonly known as: ZYRTEC Take 10 mg by mouth daily.   enoxaparin 120 MG/0.8ML injection Commonly known as: LOVENOX Inject 0.8 mLs (120 mg total) into the skin every 12 (twelve) hours. Replaces: enoxaparin 100 MG/ML injection   ibuprofen 800 MG tablet Commonly known as: ADVIL Take 1 tablet (800 mg total) by mouth every 6 (six) hours.   oxyCODONE-acetaminophen 5-325 MG tablet Commonly known as: PERCOCET/ROXICET Take 2 tablets by mouth every 4 (four) hours as needed for severe pain.        Discharge home in stable condition Infant Feeding: Bottle and Breast Infant Disposition:home with mother Discharge instruction: per After Visit Summary and Postpartum booklet. Activity: Advance as tolerated. Pelvic rest for 6 weeks.  Diet: carb modified diet Future Appointments: No future appointments. Follow up Visit:  Follow-up Information    [facility] Follow up.   Specialty: Obstetrics and Gynecology Why: This week for a BP check Contact information: [address] [place] [phone]             Message sent to FT 06/02/20 by Sylvester Harder.   Please schedule this patient for a In person postpartum visit in 6 weeks with the following provider: MD. Additional Postpartum F/U:2 hour GTT, Incision check 1 week and BP check 1 week  High risk pregnancy complicated by: GDM, HTN and PE (on lovenox) Delivery mode:  C-Section, Low Transverse  Anticipated Birth Control:  outpatient nexplanon  Message sent to clinic to schedule BP check  in 2-3 days.   06/05/2020 Wende Mott, CNM

## 2020-06-02 NOTE — Op Note (Signed)
Preoperative Diagnosis:  IUP @ [redacted]w[redacted]d, breech presentation  Postoperative Diagnosis:  Same  Procedure: primary low transverse cesarean section  Surgeon: Darron Doom, M.D.  Assistant: Corliss Blacker, MD  Findings: Viable female infant, APGAR (1 MIN): 3   APGAR (5 MINS): 9   Weight pending, breech presentation  Estimated blood loss: 142mL Urine output: 35mL Intake: [id number]  Complications: None known  Specimens: Placenta to labor and delivery  Reason for procedure: Briefly, the patient is a 34 y.o. G2P1001 [redacted]w[redacted]d who presents in spontaneous labor with fetus in breech presentation. Patient declined breech delivery and had failed external cephalic version so decision was made to proceed with cesarean section.  Procedure: Patient is a to the OR where spinal analgesia was administered. She was then placed in a supine position with left lateral tilt. She received 3 g of Ancef and SCDs were in place. A timeout was performed. She was prepped and draped in the usual sterile fashion. A Foley catheter was placed in the bladder. A knife was then used to make a Pfannenstiel incision. This incision was carried out to underlying fascia which was divided in the midline with the knife. The incision was extended laterally, bluntly.  The rectus was divided in the midline.  The peritoneal cavity was entered bluntly.  Alexis retractor was placed inside the incision.  A knife was used to make a low transverse incision on the uterus. This incision was carried down to the amniotic cavity was entered with clear fluid. Fetus was in breech position and was brought up out of the incision without difficulty. Cord was clamped x 2 and cut. Infant taken to waiting nurse.  Cord blood and cord arterial blood gas was obtained. Placenta was delivered from the uterus.  Uterus was cleaned with dry lap pads. Uterine incision closed with 0 Monocryl suture in a locked running fashion. A second layer of 0 Monocryl in an imbricating  fashion was used to achieve hemostasis. Alexis retractor was removed from the abdomen. Peritoneal closure was done with 0 Monocryl suture.  Fascia is closed with 0 Vicryl suture in a running fashion. Subcutaneous tissue infused with 30cc 0.25% Marcaine. Skin closed using 3-0 Vicryl on a Keith needle.  Steri strips applied, followed by pressure dressing.  All instrument, needle and lap counts were correct x 2.  Patient was awake and taken to PACU stable.  Infant remained with mom in couplet care, stable.   Gifford Shave FirestoneMD 56/10/3327 10:26 PM

## 2020-06-02 NOTE — MAU Note (Signed)
Pt has been having ctx since 05/30/2020, 2-3x an hour then die down and come back 2x hour. Pt states it is a 5/10. Pt went to office appt and had cervical exam and was found to be 4/ breech w/ feet in cervix. Pt denies LOF, vaginal bleeding, and reports +FM.

## 2020-06-02 NOTE — Progress Notes (Signed)
HIGH-RISK PREGNANCY VISIT Patient name: Denise Khan MRN [id number]  Date of birth: [birth year] Chief Complaint:   Routine Prenatal Visit  History of Present Illness:   Denise Khan is a 34 y.o. 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  Having some irregular ctx "all week". Contractions: Irritability. Vag. Bleeding: Scant.  Movement: Present. denies leaking of fluid.  Review of Systems:   Pertinent items are noted in HPI Denies abnormal vaginal discharge w/ itching/odor/irritation, headaches, visual changes, shortness of breath, chest pain, abdominal pain, severe nausea/vomiting, or problems with urination or bowel movements unless otherwise stated above. Pertinent History Reviewed:  Reviewed past medical,surgical, social, obstetrical and family history.  Reviewed problem list, medications and allergies. Physical Assessment:   Vitals:   06/02/20 1536  BP: 129/83  Pulse: 92  Weight: 266 lb 8 oz (120.9 kg)  Body mass index is 38.24 kg/m.           Physical Examination:   General appearance: alert, well appearing, and in no distress  Mental status: alert, oriented to person, place, and time  Skin: warm & dry   Extremities: Edema: None    Cardiovascular: normal heart rate noted  Respiratory: normal respiratory effort, no distress  Abdomen: gravid, soft, non-tender  Pelvic: Cervical exam performed  Dilation: 4 Effacement (%): 70    Fetal Status:     Movement: Present Presentation: Complete Breech [place] 36+6 wks,complete breech,FHR 138 BPM,anterior placenta gr 2,AFI 11 cm,BPP 8/8  Fetal Surveillance Testing today: BPP   Results for orders placed or performed in visit on 06/02/20 (from the past 24  hour(s))  POC Urinalysis Dipstick OB   Collection Time: 06/02/20  3:32 PM  Result Value Ref Range   Color, UA     Clarity, UA     Glucose, UA Negative Negative   Bilirubin, UA     Ketones, UA large    Spec Grav, UA     Blood, UA large    pH, UA     POC,PROTEIN,UA Large (3+) Negative, Trace, Small (1+), Moderate (2+), Large (3+), 4+   Urobilinogen, UA     Nitrite, UA neg    Leukocytes, UA Negative Negative   Appearance     Odor      Assessment & Plan:  1) High-risk pregnancy G2P1001 at [redacted]w[redacted]d with an Estimated Date of Delivery: 06/24/20   ) Class A2 DM metformin 500/1000 CBG overall well controlled  2) Hx of PE x 2, on lovenox BID,   3 ) complete breech,  Treatment plan  Wants ECV  3) ? labor,   Treatment plan  Meds: No orders of the defined types were placed in this encounter.   Labs/procedures today: BPP  TO MAU FOR EVALUATION . Dr Rip Harbour and Vernice Jefferson notified   Follow-up: No follow-ups on file.  Future Appointments  Date Time Provider [place]  06/06/2020  2:30 PM Florian Buff, MD [facility]  06/09/2020  2:30 PM [facility] [place] [facility] None  06/09/2020  3:30 PM Roma Schanz, CNM [facility]  06/13/2020  2:50 PM Roma Schanz, CNM [facility]  06/16/2020  3:00 PM [place] - [facility] [place] [facility] None  06/16/2020  3:50 PM Christin Fudge, CNM [facility]  06/20/2020  2:30 PM Cresenzo-Dishmon, Joaquim Lai, CNM [facility]    Orders Placed This Encounter  Procedures  . POC Urinalysis Dipstick OB   Christin Fudge DNP, CNM 06/02/2020 4:20 PM

## 2020-06-02 NOTE — MAU Note (Signed)
Sent from MD office for labor eval.  States baby is breech and having irregular ctxs.  Reports MD wants to "turn the baby".  Denies VB, but spotting with wiping.  Denies LOF.  Endorses +FM.

## 2020-06-02 NOTE — Progress Notes (Signed)
[place] 36+6 wks,complete breech,FHR 138 BPM,anterior placenta gr 2,AFI 11 cm,BPP 8/8

## 2020-06-02 NOTE — Anesthesia Procedure Notes (Signed)
Spinal  Patient location during procedure: OR Start time: 06/02/2020 9:23 PM End time: 06/02/2020 9:26 PM Staffing Performed: anesthesiologist  Anesthesiologist: Pervis Hocking, DO Preanesthetic Checklist Completed: patient identified, IV checked, risks and benefits discussed, surgical consent, monitors and equipment checked, pre-op evaluation and timeout performed Spinal Block Patient position: sitting Prep: DuraPrep and site prepped and draped Patient monitoring: cardiac monitor, continuous pulse ox and blood pressure Approach: midline Location: L3-4 Injection technique: single-shot Needle Needle type: Pencan  Needle gauge: 24 G Needle length: 9 cm Assessment Sensory level: T6 Additional Notes Functioning IV was confirmed and monitors were applied. Sterile prep and drape, including hand hygiene and sterile gloves were used. The patient was positioned and the spine was prepped. The skin was anesthetized with lidocaine.  Free flow of clear CSF was obtained prior to injecting local anesthetic into the CSF.  The spinal needle aspirated freely following injection.  The needle was carefully withdrawn.  The patient tolerated the procedure well.

## 2020-06-02 NOTE — Progress Notes (Signed)
Patient ID: Denise Khan, female   DOB: January 17, 1986, 34 y.o.   MRN: [id number] After informed verbal consent, Terbutaline 0.25 mg SQ given, ECV was attempted under Ultrasound guidance.  Forward roll attempted x 3 with no movement of the fetal head.   FHR was reactive before and after the procedure.   Pt. Tolerated the procedure well.

## 2020-06-02 NOTE — Anesthesia Postprocedure Evaluation (Signed)
Anesthesia Post Note  Patient: Denise Khan  Procedure(s) Performed: CESAREAN SECTION     Patient location during evaluation: PACU Anesthesia Type: Spinal Level of consciousness: oriented and awake and alert Pain management: pain level controlled Vital Signs Assessment: post-procedure vital signs reviewed and stable Respiratory status: spontaneous breathing and respiratory function stable Cardiovascular status: blood pressure returned to baseline and stable Postop Assessment: no headache, no backache, no apparent nausea or vomiting, patient able to bend at knees and spinal receding Anesthetic complications: no   No complications documented.  Last Vitals:  Vitals:   06/02/20 2317 06/02/20 2320  BP: 116/69   Pulse: 88 90  Resp: 15 (!) 23  Temp:    SpO2: 99% 100%    Last Pain:  Vitals:   06/02/20 2320  TempSrc:   PainSc: 0-No pain   Pain Goal:    LLE Motor Response: Purposeful movement (06/02/20 2320)   RLE Motor Response: Non-purposeful movement (06/02/20 2320)       Epidural/Spinal Function Cutaneous sensation: No Sensation (06/02/20 2320), Patient able to flex knees: Yes (06/02/20 2320), Patient able to lift hips off bed: No (06/02/20 2320), Back pain beyond tenderness at insertion site: No (06/02/20 2320), Progressively worsening motor and/or sensory loss: No (06/02/20 2320), Bowel and/or bladder incontinence post epidural: No (06/02/20 2320)  Pervis Hocking

## 2020-06-02 NOTE — MAU Provider Note (Signed)
History     [id number]  Arrival date and time: 06/02/20 1758    Chief Complaint  Patient presents with  . Labor Eval     HPI Denise Khan is a 33 y.o. at 57w6dwith PMHx notable for PEx2 on Lovenox 1057mBID, GDMA2, cHTN, who presents for labor evaluation.   [facility] was found to be having contraction 4 cm Reports she is still having contractions intermittently but not terribly uncomfortable at present She last took her Lovenox last night around 730 to 8 PM, she forgot to take it this morning She currently has a mild headache, but denies chest pain, shortness of breath, right upper quadrant abdominal pain, or lower extremity edema Had some vaginal spotting yesterday but none today No loss of fluid Baby is moving normally   B/Positive/-- (06/15 1546)  OB History    Gravida  2   Para  1   Term  1   Preterm      AB      Living  1     SAB      TAB      Ectopic      Multiple      Live Births  1           Past Medical History:  Diagnosis Date  . Anxiety   . Blood transfusion without reported diagnosis   . [place] ([place]  . Clotting disorder ([place]  . PE (pulmonary embolism)     Past Surgical History:  Procedure Laterality Date  . KNEE SURGERY    . PORT-A-CATH REMOVAL      Family History  Problem Relation Age of Onset  . CAD Father   . Hypertension Father   . Heart disease Father   . Hypertension Mother   . Cancer Maternal Grandmother        lung  . Stroke Maternal Grandfather   . Hypertension Maternal Grandfather   . Kidney disease Maternal Grandfather     Social History   Socioeconomic History  . Marital status: Significant Other    Spouse name: Not on file  . Number of children: 1  . Years of education: 1651. Highest education level: Not on file  Occupational History  . Occupation: biPatent attorney  Comment: [employer]  Tobacco Use  . Smoking status: Never Smoker  . Smokeless  tobacco: Never Used  Vaping Use  . Vaping Use: Never used  Substance and Sexual Activity  . Alcohol use: No  . Drug use: Not Currently    Types: Marijuana  . Sexual activity: Not Currently    Birth control/protection: None  Other Topics Concern  . 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 Difficulty of Paying Living Expenses: Not hard at all  Food Insecurity: No Food Insecurity  . Worried About RuCharity fundraisern the Last Year: Never true  . Ran Out of Food in the Last Year: Never true  Transportation Needs: No Transportation Needs  . Lack of Transportation (Medical): No  . Lack of Transportation (Non-Medical): No  Physical Activity: Insufficiently Active  . Days of Exercise per Week: 1 day  . Minutes of Exercise per Session: 10 min  Stress: No Stress  Concern Present  . Feeling of Stress : Only a little  Social Connections: Moderately Integrated  . Frequency of Communication with Friends and Family: Three times a week  . Frequency of Social Gatherings with Friends and Family: Once a week  . Attends Religious Services: 1 to 4 times per year  . Active Member of Clubs or Organizations: No  . Attends Archivist Meetings: Never  . Marital Status: Living with partner  Intimate Partner Violence: Not At Risk  . Fear of Current or Ex-Partner: No  . Emotionally Abused: No  . Physically Abused: No  . Sexually Abused: No    No Known Allergies  No current facility-administered medications on file prior to encounter.   Current Outpatient Medications on File Prior to Encounter  Medication Sig Dispense Refill  . cetirizine (ZYRTEC) 10 MG tablet Take 10 mg by mouth daily.    Marland Kitchen enoxaparin (LOVENOX) 100 MG/ML injection Inject 1 mL (100 mg total) into the skin every 12 (twelve) hours. 60 mL 10  . metFORMIN (GLUCOPHAGE)  500 MG tablet Take 1 tablet (500 mg total) by mouth 2 (two) times daily with a meal. 60 tablet 3  . Prenatal Vit-Fe Fumarate-FA (PRENATAL VITAMIN PO) Take by mouth.    . Accu-Chek Softclix Lancets lancets Use as instructed to check blood sugar 4 times daily 100 each 12  . Blood Glucose Monitoring Suppl (ACCU-CHEK GUIDE ME) w/Device KIT 1 each by Does not apply route 4 (four) times daily. 1 kit 0  . glucose blood (ACCU-CHEK GUIDE) test strip Use as instructed to check blood sugar 4 times daily 50 each 12     ROS Pertinent positives and negative per HPI, all others reviewed and negative  Physical Exam   BP (!) 150/91   Pulse 87   Temp 98.3 F (36.8 C) (Oral)   Resp 20   Ht '5\' 10"'  (1.778 m)   Wt 120.7 kg   LMP 09/18/2019   SpO2 98%   BMI 38.20 kg/m   Physical Exam Vitals reviewed.  Constitutional:      General: She is not in acute distress.    Appearance: She is well-developed. She is not diaphoretic.  Eyes:     General: No scleral icterus. Pulmonary:     Effort: Pulmonary effort is normal. No respiratory distress.  Abdominal:     General: There is no distension.     Palpations: Abdomen is soft.     Tenderness: There is no abdominal tenderness. There is no guarding or rebound.  Skin:    General: Skin is warm and dry.  Neurological:     Mental Status: She is alert.     Coordination: Coordination normal.      Cervical Exam Dilation: 6 Effacement (%): 80 Presentation: Complete Breech  Bedside Ultrasound Not done  My interpretation: n/a  FHT Baseline 140, moderate variability, +accels, no decels Toco: rare mild ctx Cat: I  Labs Results for orders placed or performed in visit on 06/02/20 (from the past 24 hour(s))  POC Urinalysis Dipstick OB     Status: None   Collection Time: 06/02/20  3:32 PM  Result Value Ref Range   Color, UA     Clarity, UA     Glucose, UA Negative Negative   Bilirubin, UA     Ketones, UA large    Spec Grav, UA     Blood, UA large     pH, UA     POC,PROTEIN,UA Large (3+) Negative, Trace,  Small (1+), Moderate (2+), Large (3+), 4+   Urobilinogen, UA     Nitrite, UA neg    Leukocytes, UA Negative Negative   Appearance     Odor      Imaging No results found.  MAU Course  Procedures  Lab Orders     Resp Panel by RT-PCR (Flu A&B, Covid) Nasopharyngeal Swab     Comprehensive metabolic panel     CBC     Protein / creatinine ratio, urine Meds ordered this encounter  Medications  . acetaminophen (TYLENOL) tablet 1,000 mg   Imaging Orders  No imaging studies ordered today    MDM moderate  Assessment and Plan  #Labor #Breech presentation Patient's cervix rechecked, has progressed in 4 to 6 cm since she was seen in clinic earlier today.  Discussed options with her including planned vaginal breech delivery, external cephalic version, and primary cesarean for malpresentation.  Her partner and her do not want to proceed with vaginal breech delivery due to bad outcome with prior child.  Discussed that we would attempt ECV although unlikely to be successful, if unsuccessful we will proceed with primary LTCS. Discussed case with anesthesia, patient is okay with spinal from their perspective as she has more than 24 hours out from last Lovenox dose. Signout given to L&D team.  #cHTN Mild headache now, giving her Tylenol.  Blood pressures have been better controlled prior to today.  We will get pre-E labs.  #Hx PEx2 Last Lovenox dose 24 hours prior.  #FWB FHT Cat I NST: Reactive  Clarnce Flock

## 2020-06-02 NOTE — Discharge Instructions (Signed)

## 2020-06-02 NOTE — H&P (Signed)
Obstetric Preoperative History and Physical  Denise Khan is a 34 y.o. G2P1001 with IUP at 30w6dpresenting for breech presentation and attempt at ECV vs primary LTCS.  Reports good fetal movement, no bleeding, no leaking of fluid.  No acute preoperative concerns.    Last dose of Lovenox last night.    [facility]    Pregnancy complications or risks: Patient Active Problem List   Diagnosis Date Noted  . Complete breech presentation 06/02/2020  . Breech presentation 06/02/2020  . Gestational hypertension 05/19/2020  . Gestational diabetes mellitus, class A2 03/23/2020  . Supervision of high risk pregnancy, antepartum 12/11/2019  . Trichomonas infection 10/28/2019  . Uterine fibroid 02/06/2017  . Environmental allergies 02/06/2017  . Adult BMI 35.0-35.9 kg/sq m 02/06/2017  . 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 History of pulmonary embolus (PE) 01/05/2013   She plans on breast and bottle feeding  She desires nexplanon for postpartum contraception.   Prenatal labs and studies: ABO, Rh: B/Positive/-- (06/15 1546) Antibody: Negative (09/21 0834) Rubella: 1.80 (06/15 1546) RPR: Non Reactive (09/21 0834)  HBsAg: Negative (06/15 1546)  HIV: Non Reactive (09/21 0834)  GVUD:THYHOOI/- (11/29 0000) 2 hr Glucola  abnormal  Genetic screening normal Anatomy UKoreanormal  Prenatal Transfer Tool  Maternal Diabetes: Yes:  Diabetes Type:  Insulin/Medication controlled Genetic Screening: Normal Maternal Ultrasounds/Referrals: Normal Fetal Ultrasounds or other Referrals:  Referred to Materal Fetal Medicine  Maternal Substance Abuse:  No Significant Maternal Medications: Lovenox, metformin  Significant Maternal Lab Results: None  Past Medical History:  Diagnosis Date  . Anxiety   . Blood transfusion without reported diagnosis   . Cancer ([place]   . [place] disorder ([place]   . PE (pulmonary embolism)     Past Surgical History:   Procedure Laterality Date  . KNEE SURGERY    . PORT-A-CATH REMOVAL      OB History  Gravida Para Term Preterm AB Living  '2 1 1     1  ' SAB TAB Ectopic Multiple Live Births          1    # Outcome Date GA Lbr Len/2nd Weight Sex Delivery Anes PTL Lv  2 Current           1 Term 06/28/10 [id number] g M Vag-Spont EPI  LIV    Social History   Socioeconomic History  . Marital status: Significant Other    Spouse name: Not on file  . Number of children: 1  . Years of education: 1683. Highest education level: Not on file  Occupational History  . Occupation: biPatent attorney  Comment: [employer]  Tobacco Use  . Smoking status: Never Smoker  . Smokeless tobacco: Never Used  Vaping Use  . Vaping Use: Never used  Substance and Sexual Activity  . Alcohol use: No  . Drug use: Not Currently    Types: Marijuana  . Sexual activity: Not Currently    Birth control/protection: None  Other Topics Concern  . 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 Difficulty of Paying Living Expenses: Not hard at all  Food Insecurity: No Food Insecurity  . Worried About RuCharity fundraisern the Last Year: Never true  .  Ran Out of Food in the Last Year: Never true  Transportation Needs: No Transportation Needs  . Lack of Transportation (Medical): No  . Lack of Transportation (Non-Medical): No  Physical Activity: Insufficiently Active  . Days of Exercise per Week: 1 day  . Minutes of Exercise per Session: 10 min  Stress: No Stress Concern Present  . Feeling of Stress : Only a little  Social Connections: Moderately Integrated  . Frequency of Communication with Friends and Family: Three times a week  . Frequency of Social Gatherings with Friends and Family: Once a week  . Attends Religious Services: 1 to 4 times per year  .  Active Member of Clubs or Organizations: No  . Attends Archivist Meetings: Never  . Marital Status: Living with partner    Family History  Problem Relation Age of Onset  . CAD Father   . Hypertension Father   . Heart disease Father   . Hypertension Mother   . Cancer Maternal Grandmother        lung  . Stroke Maternal Grandfather   . Hypertension Maternal Grandfather   . Kidney disease Maternal Grandfather     Medications Prior to Admission  Medication Sig Dispense Refill Last Dose  . cetirizine (ZYRTEC) 10 MG tablet Take 10 mg by mouth daily.   Past Month at Unknown time  . enoxaparin (LOVENOX) 100 MG/ML injection Inject 1 mL (100 mg total) into the skin every 12 (twelve) hours. 60 mL 10 06/01/2020 at Unknown time  . metFORMIN (GLUCOPHAGE) 500 MG tablet Take 1 tablet (500 mg total) by mouth 2 (two) times daily with a meal. 60 tablet 3 06/02/2020 at Unknown time  . Prenatal Vit-Fe Fumarate-FA (PRENATAL VITAMIN PO) Take by mouth.   06/02/2020 at Unknown time  . Accu-Chek Softclix Lancets lancets Use as instructed to check blood sugar 4 times daily 100 each 12   . Blood Glucose Monitoring Suppl (ACCU-CHEK GUIDE ME) w/Device KIT 1 each by Does not apply route 4 (four) times daily. 1 kit 0   . glucose blood (ACCU-CHEK GUIDE) test strip Use as instructed to check blood sugar 4 times daily 50 each 12     No Known Allergies  Review of Systems: Pertinent items noted in HPI and remainder of comprehensive ROS otherwise negative.  Physical Exam: BP (!) 150/91   Pulse 87   Temp 98.3 F (36.8 C) (Oral)   Resp 20   Ht '5\' 10"'  (1.778 m)   Wt 120.7 kg   LMP 09/18/2019   SpO2 98%   BMI 38.20 kg/m  FHR by Doppler: 140 bpm CONSTITUTIONAL: Well-developed, well-nourished female in no acute distress.  HENT:  Normocephalic, atraumatic,   SKIN: Skin is warm and dry. No rash noted. Not diaphoretic. No erythema. No pallor. PSYCHIATRIC: Normal mood and affect. Normal behavior. Normal  judgment and thought content. CARDIOVASCULAR: Normal heart rate noted RESPIRATORY: Effort and breath sounds normal, no problems with respiration noted ABDOMEN: Soft, nontender, nondistended, gravid.   PELVIC: 6cm per MAU provider MUSCULOSKELETAL:  No edema and no tenderness. 2+ distal pulses.   Pertinent Labs/Studies:   Results for orders placed or performed in visit on 06/02/20 (from the past 72 hour(s))  POC Urinalysis Dipstick OB     Status: None   Collection Time: 06/02/20  3:32 PM  Result Value Ref Range   Color, UA     Clarity, UA     Glucose, UA Negative Negative   Bilirubin, UA  Ketones, UA large    Spec Grav, UA     Blood, UA large    pH, UA     POC,PROTEIN,UA Large (3+) Negative, Trace, Small (1+), Moderate (2+), Large (3+), 4+   Urobilinogen, UA     Nitrite, UA neg    Leukocytes, UA Negative Negative   Appearance     Odor      Assessment and Plan: Denise Khan is a 34 y.o. G2P1001 at 38w6dbeing [facility].  #breech presentation  Risks/benefits of ECV previously discussed with patient by Dr. PKennon Rounds ECV attempted in PACU by Dr. PKennon Rounds Given ECV unsuccessful and patient declines breech delivery, will proceed with cesarean section.   The risks of cesarean section discussed with the patient included but were not limited to: bleeding which may require transfusion or reoperation; infection which may require antibiotics; injury to bowel, bladder, ureters or other surrounding organs; injury to the fetus; need for additional procedures including hysterectomy in the event of a life-threatening hemorrhage; placental abnormalities with subsequent pregnancies, incisional problems, thromboembolic phenomenon and other postoperative/anesthesia complications. The patient concurred with the proposed plan, giving informed written consent for the procedure.    Pregnancy Complications:   -Hx of PE, on Lovenox: last dose on  evening of 12/1. Will restart after cesarean delivery.  -A2GDM: On metformin 500/1000, reports good control. AM fasting BGL.  -Elevated blood pressure: on arrival to MAU, PEC labs nml. Asymptomatic.   Contraception: nexplanon [facility]

## 2020-06-03 ENCOUNTER — Encounter (HOSPITAL_COMMUNITY): Payer: Self-pay | Admitting: Family Medicine

## 2020-06-03 LAB — CBC
HCT: 33.7 % — ABNORMAL LOW (ref 36.0–46.0)
Hemoglobin: 11.3 g/dL — ABNORMAL LOW (ref 12.0–15.0)
MCH: 33.3 pg (ref 26.0–34.0)
MCHC: 33.5 g/dL (ref 30.0–36.0)
MCV: 99.4 fL (ref 80.0–100.0)
Platelets: 220 10*3/uL (ref 150–400)
RBC: 3.39 MIL/uL — ABNORMAL LOW (ref 3.87–5.11)
RDW: 13.2 % (ref 11.5–15.5)
WBC: 11.6 10*3/uL — ABNORMAL HIGH (ref 4.0–10.5)
nRBC: 0 % (ref 0.0–0.2)

## 2020-06-03 LAB — CULTURE, BETA STREP (GROUP B ONLY): Strep Gp B Culture: POSITIVE — AB

## 2020-06-03 LAB — GLUCOSE, CAPILLARY: Glucose-Capillary: 83 mg/dL (ref 70–99)

## 2020-06-03 LAB — SPECIMEN STATUS REPORT

## 2020-06-03 MED ORDER — KETOROLAC TROMETHAMINE 30 MG/ML IJ SOLN
30.0000 mg | Freq: Four times a day (QID) | INTRAMUSCULAR | Status: AC
  Administered 2020-06-03 (×3): 30 mg via INTRAVENOUS
  Filled 2020-06-03 (×3): qty 1

## 2020-06-03 MED ORDER — IBUPROFEN 800 MG PO TABS
800.0000 mg | ORAL_TABLET | Freq: Four times a day (QID) | ORAL | Status: DC
  Administered 2020-06-04 – 2020-06-05 (×7): 800 mg via ORAL
  Filled 2020-06-03 (×7): qty 1

## 2020-06-03 MED ORDER — TETANUS-DIPHTH-ACELL PERTUSSIS 5-2.5-18.5 LF-MCG/0.5 IM SUSY: 0.5000 mL | PREFILLED_SYRINGE | Freq: Once | INTRAMUSCULAR | Status: DC

## 2020-06-03 MED ORDER — WITCH HAZEL-GLYCERIN EX PADS: 1.0000 "application " | MEDICATED_PAD | CUTANEOUS | Status: DC | PRN

## 2020-06-03 MED ORDER — OXYTOCIN-SODIUM CHLORIDE 30-0.9 UT/500ML-% IV SOLN
2.5000 [IU]/h | INTRAVENOUS | Status: AC
  Administered 2020-06-03: 2.5 [IU]/h via INTRAVENOUS
  Filled 2020-06-03: qty 500

## 2020-06-03 MED ORDER — MEASLES, MUMPS & RUBELLA VAC IJ SOLR: 0.5000 mL | Freq: Once | INTRAMUSCULAR | Status: DC

## 2020-06-03 MED ORDER — LACTATED RINGERS IV SOLN: INTRAVENOUS | Status: DC

## 2020-06-03 MED ORDER — SIMETHICONE 80 MG PO CHEW
80.0000 mg | CHEWABLE_TABLET | ORAL | Status: DC
  Administered 2020-06-04 – 2020-06-05 (×2): 80 mg via ORAL
  Filled 2020-06-03 (×2): qty 1

## 2020-06-03 MED ORDER — OXYCODONE HCL 5 MG PO TABS
5.0000 mg | ORAL_TABLET | ORAL | Status: DC | PRN
  Administered 2020-06-04: 10 mg via ORAL
  Administered 2020-06-04: 5 mg via ORAL
  Administered 2020-06-04 – 2020-06-05 (×2): 10 mg via ORAL
  Filled 2020-06-03 (×2): qty 2
  Filled 2020-06-03: qty 1
  Filled 2020-06-03: qty 2

## 2020-06-03 MED ORDER — PRENATAL MULTIVITAMIN CH
1.0000 | ORAL_TABLET | Freq: Every day | ORAL | Status: DC
  Administered 2020-06-03 – 2020-06-05 (×3): 1 via ORAL
  Filled 2020-06-03 (×3): qty 1

## 2020-06-03 MED ORDER — ENOXAPARIN SODIUM 100 MG/ML ~~LOC~~ SOLN
100.0000 mg | Freq: Two times a day (BID) | SUBCUTANEOUS | Status: DC
  Filled 2020-06-03: qty 1

## 2020-06-03 MED ORDER — ACETAMINOPHEN 325 MG PO TABS
650.0000 mg | ORAL_TABLET | ORAL | Status: DC | PRN
  Administered 2020-06-04: 650 mg via ORAL
  Filled 2020-06-03: qty 2

## 2020-06-03 MED ORDER — MENTHOL 3 MG MT LOZG: 1.0000 | LOZENGE | OROMUCOSAL | Status: DC | PRN

## 2020-06-03 MED ORDER — DIPHENHYDRAMINE HCL 25 MG PO CAPS: 25.0000 mg | ORAL_CAPSULE | Freq: Four times a day (QID) | ORAL | Status: DC | PRN

## 2020-06-03 MED ORDER — SIMETHICONE 80 MG PO CHEW
80.0000 mg | CHEWABLE_TABLET | Freq: Three times a day (TID) | ORAL | Status: DC
  Administered 2020-06-03 – 2020-06-05 (×7): 80 mg via ORAL
  Filled 2020-06-03 (×7): qty 1

## 2020-06-03 MED ORDER — DIBUCAINE (PERIANAL) 1 % EX OINT: 1.0000 "application " | TOPICAL_OINTMENT | CUTANEOUS | Status: DC | PRN

## 2020-06-03 MED ORDER — COCONUT OIL OIL: 1.0000 "application " | TOPICAL_OIL | Status: DC | PRN

## 2020-06-03 MED ORDER — KETOROLAC TROMETHAMINE 30 MG/ML IJ SOLN: 30.0000 mg | Freq: Once | INTRAMUSCULAR | Status: DC

## 2020-06-03 MED ORDER — SIMETHICONE 80 MG PO CHEW: 80.0000 mg | CHEWABLE_TABLET | ORAL | Status: DC | PRN

## 2020-06-03 MED ORDER — SENNOSIDES-DOCUSATE SODIUM 8.6-50 MG PO TABS
2.0000 | ORAL_TABLET | ORAL | Status: DC
  Administered 2020-06-04: 2 via ORAL
  Filled 2020-06-03: qty 2

## 2020-06-03 MED ORDER — ENOXAPARIN SODIUM 120 MG/0.8ML ~~LOC~~ SOLN
120.0000 mg | Freq: Two times a day (BID) | SUBCUTANEOUS | Status: DC
  Administered 2020-06-03 – 2020-06-04 (×4): 120 mg via SUBCUTANEOUS
  Filled 2020-06-03 (×5): qty 0.8

## 2020-06-03 NOTE — Progress Notes (Addendum)
POSTPARTUM PROGRESS NOTE  Subjective: Denise Khan is a 34 y.o. H6P5916 s/p pLTCS at [redacted]w[redacted]d.  She reports she doing well. No acute events overnight. She denies any problems with ambulating and voiding. Denies nausea or vomiting. She has not passed flatus. Pain is well controlled.  Lochia is mild without clotting.  Objective: Blood pressure (!) 144/89, pulse 67, temperature 98.1 F (36.7 C), temperature source Oral, resp. rate 20, height 5\' 10"  (1.778 m), weight 120.7 kg, last menstrual period 09/18/2019, SpO2 99 %, unknown if currently breastfeeding.  Physical Exam:  General: alert, cooperative and no distress Chest: no respiratory distress Abdomen: soft, non-tender, incision site covered with clean and dry dressing, no saturations noted  Uterine Fundus: firm and at level of umbilicus Extremities: No calf swelling or tenderness, no LE edema noted bilaterally   Recent Labs    06/02/20 1908 06/03/20 0545  HGB 13.3 11.3*  HCT 37.7 33.7*    Assessment/Plan: Denise Khan is a 34 y.o. B8G6659 s/p pLTCS at [redacted]w[redacted]d for IOL.  Routine Postpartum Care: Doing well, pain well-controlled.  -- Continue routine care, lactation support  -- Contraception: outpatient Nexplanon due to insurance coverage  -- Feeding: both breast and bottle feeding  --hx of PE: on lovenox --elevated BP: systolic BP ranges between 120-130 but most recent BP 144/89 --A2GDM: most recent CBGs 87>83  Dispo: Plan for discharge anticipated tomorrow (06/04/2020).  Donney Dice, DO 06/03/2020 7:29 AM

## 2020-06-03 NOTE — Social Work (Signed)
MOB was referred for history of anxiety & THC use.   * Referral for anxiety screened out by Clinical Social Worker because none of the following criteria appear to apply:  ~ History of anxiety/depression during this pregnancy, or of post-partum depression following prior delivery. ~ Diagnosis of anxiety and/or depression within last 3 years. Chart review notes anxiety date at or around the year 2018. OR * MOB's symptoms currently being treated with medication and/or therapy.  Referral for [facility] use was screened out due to the following: ~MOB had no documented substance use after initial prenatal visit/+UPT. ~MOB had no positive drug screens after initial prenatal visit/+UPT. There is no noted current or maternal substance use.   Please consult CSW if current concerns arise or by MOB's request.  CSW will monitor UDS/CDS results and make report to Child Protective Services if warranted.  Please contact the Clinical Social Worker if needs arise, by [facility] request, or if MOB scores greater than 9/yes to question 10 on Edinburgh Postpartum Depression Screen.  Darra Lis, [place] [employer]  [phone]

## 2020-06-04 NOTE — Lactation Note (Signed)
This note was copied from a baby's chart. Lactation Consultation Note  Patient Name: Boy Itzabella Sorrels CWCBJ'S Date: 06/04/2020 Reason for consult: Initial assessment;Late-preterm 34-36.6wks   P2 mother whose infant is now 11 hours old.  This is a LPTI at 36+6 weeks with a CGA of 37+1 weeks.  Mother's feeding preference is breast/bottle.  Mother breast fed her first child (now 35 years old) for one year.  Baby was asleep in the bassinet when I arrived.  Mother had no immediate questions/concerns related to breast feeding.  Reviewed the LPTI policy guidelines with mother.  Suggested she begin pumping with the DEBP for breast stimulation.  Mother willing to begin pumping.  Pump parts, assembly, disassembly and cleaning reviewed.  Colostrum container provided for any EBM she obtains.  Milk storage times reviewed and finger feeding demonstrated.  Mother feels like her son is latching well since delivery.  Offered to return for assistance as needed.  Mother will feed on cue or at least 8-12 times/24 hours.  She will awaken baby if he remains sleeping after three hours.  RN updated.    Mom made aware of O/P services, breastfeeding support groups, community resources, and our phone # for post-discharge questions. Mother has a DEBP for home use.  Grandmother present.   Maternal Data Formula Feeding for Exclusion: Yes Reason for exclusion: Mother's choice to formula and breast feed on admission Has patient been taught Hand Expression?: Yes Does the patient have breastfeeding experience prior to this delivery?: Yes  Feeding    LATCH Score                   Interventions    Lactation Tools Discussed/Used Pump Review: Setup, frequency, and cleaning;Milk Storage Initiated by:: Paul Dykes Date initiated:: 06/04/20   Consult Status Consult Status: Follow-up Date: 06/05/20 Follow-up type: In-patient    Anaria Kroner R Lynette Noah 06/04/2020, 1:55 PM

## 2020-06-04 NOTE — Progress Notes (Signed)
POSTPARTUM PROGRESS NOTE  Subjective: Denise Khan is a 34 y.o. S6O1561 s/p pLTCS at [redacted]w[redacted]d.  She states that overall she is improving. Pt still has pain rated 3-4/10 at baseline and worsens with movement. Pt got oxy x1 at 0600 with improvement in pain. Pt states that she would like to stay one more day to continue recovering.   Objective: Blood pressure 118/73, pulse 97, temperature 98.1 F (36.7 C), temperature source Oral, resp. rate 18, height 5\' 10"  (1.778 m), weight 120.7 kg, last menstrual period 09/18/2019, SpO2 99 %, unknown if currently breastfeeding.  Physical Exam:  General: alert, cooperative and no distress Chest: no respiratory distress Abdomen: soft, non-tender, incision site covered with clean and dry dressing, no saturations noted  Uterine Fundus: firm and below level of umbilicus Extremities: No calf swelling or tenderness, no LE edema noted bilaterally   Recent Labs    06/02/20 1908 06/03/20 0545  HGB 13.3 11.3*  HCT 37.7 33.7*    Assessment/Plan: Denise Khan is a 34 y.o. G2P1102 POD#2 pLTCS at [redacted]w[redacted]d for breech.  Routine Postpartum Care: Doing well, pain well-controlled.  -- Continue routine care, lactation support  -- Contraception: outpatient Nexplanon due to insurance coverage  -- Feeding: both breast and bottle feeding  --hx of PE: on lovenox --elevated BP: 24h BP stable and nl with systolic 537-943 and diastolic 27-61 --Y7WLK: most recent CBGs 69  Dispo: Plan for discharge tomorrow 12/5.  Marcello Moores, Medical Student 06/04/2020 7:10 AM   Attestation of Supervision of Student:  I confirm that I have verified the information documented in the medical student's note and that I have also personally reperformed the history, physical exam and all medical decision making activities.  I have verified that all services and findings are accurately documented in this student's note; and I agree with management and plan as outlined in the documentation. I  have also made any necessary editorial changes.  Clarisa Fling, NP [facility] [employer], [place] [facility] 06/04/2020 10:26 AM

## 2020-06-05 LAB — RPR: RPR Ser Ql: NONREACTIVE

## 2020-06-05 MED ORDER — ETONOGESTREL 68 MG ~~LOC~~ IMPL: 68.0000 mg | DRUG_IMPLANT | Freq: Once | SUBCUTANEOUS | Status: DC

## 2020-06-05 MED ORDER — AMLODIPINE BESYLATE 5 MG PO TABS
5.0000 mg | ORAL_TABLET | Freq: Every day | ORAL | Status: DC
  Administered 2020-06-05: 5 mg via ORAL
  Filled 2020-06-05: qty 1

## 2020-06-05 MED ORDER — ENOXAPARIN SODIUM 120 MG/0.8ML ~~LOC~~ SOLN: 120.0000 mg | Freq: Two times a day (BID) | SUBCUTANEOUS | 0 refills | Status: DC

## 2020-06-05 MED ORDER — LIDOCAINE HCL 1 % IJ SOLN: 0.0000 mL | Freq: Once | INTRAMUSCULAR | Status: DC | PRN

## 2020-06-05 MED ORDER — OXYCODONE-ACETAMINOPHEN 5-325 MG PO TABS
2.0000 | ORAL_TABLET | ORAL | 0 refills | Status: DC | PRN
Start: 2020-06-05 — End: 2021-02-08

## 2020-06-05 MED ORDER — IBUPROFEN 800 MG PO TABS: 800.0000 mg | ORAL_TABLET | Freq: Four times a day (QID) | ORAL | 0 refills | Status: DC

## 2020-06-05 NOTE — Progress Notes (Shared)
POSTPARTUM PROGRESS NOTE  Subjective: Denise Khan is a 34 y.o. Y6M6004 s/p pLTCS at [redacted]w[redacted]d.  Continues to improve. Pt still has episodes of pain but is ready to go home. Had one episode of elevated pressures given episode of pain and pumping at the time. Nurse will recheck   Objective: Blood pressure (!) 146/92, pulse 72, temperature 98.4 F (36.9 C), temperature source Oral, resp. rate 18, height 5\' 10"  (1.778 m), weight 120.7 kg, last menstrual period 09/18/2019, SpO2 100 %, unknown if currently breastfeeding.  Physical Exam:  General: alert, cooperative and no distress Chest: no respiratory distress Abdomen: soft, non-tender, incision site covered with clean and dry dressing, no saturations noted  Uterine Fundus: firm and below level of umbilicus Extremities: No calf swelling or tenderness, no LE edema noted bilaterally   Recent Labs    06/02/20 1908 06/03/20 0545  HGB 13.3 11.3*  HCT 37.7 33.7*    Assessment/Plan: Denise Khan is a 34 y.o. G2P1102 POD#2 pLTCS at [redacted]w[redacted]d for breech.  Routine Postpartum Care: Doing well, pain well-controlled.  -- Continue routine care, lactation support  -- Contraception: outpatient Nexplanon due to insurance coverage  -- Feeding: both breast and bottle feeding  --hx of PE: on lovenox --elevated BP: elevated BP while pumping; start 5 norvasc --A2GDM: most recent CBGs 69  Dispo: Plan for discharge today Denise Khan, Medical Student 06/05/2020 8:47 AM

## 2020-06-06 ENCOUNTER — Other Ambulatory Visit: Payer: BC Managed Care – PPO | Admitting: Obstetrics & Gynecology

## 2020-06-09 ENCOUNTER — Ambulatory Visit (INDEPENDENT_AMBULATORY_CARE_PROVIDER_SITE_OTHER): Payer: BC Managed Care – PPO | Admitting: Obstetrics & Gynecology

## 2020-06-09 ENCOUNTER — Encounter: Payer: Self-pay | Admitting: Obstetrics & Gynecology

## 2020-06-09 ENCOUNTER — Encounter: Payer: Self-pay | Admitting: *Deleted

## 2020-06-09 ENCOUNTER — Other Ambulatory Visit: Payer: Self-pay

## 2020-06-09 ENCOUNTER — Other Ambulatory Visit: Payer: BC Managed Care – PPO | Admitting: Women's Health

## 2020-06-09 ENCOUNTER — Other Ambulatory Visit: Payer: BC Managed Care – PPO

## 2020-06-09 ENCOUNTER — Encounter: Payer: BC Managed Care – PPO | Admitting: Women's Health

## 2020-06-09 VITALS — BP 133/87 | HR 83 | Ht 70.0 in | Wt 257.5 lb

## 2020-06-09 DIAGNOSIS — Z98891 History of uterine scar from previous surgery: Secondary | ICD-10-CM

## 2020-06-09 NOTE — Progress Notes (Signed)
  HPI: Patient returns for routine postoperative follow-up having undergone primary Caesarean section on 06/02/20.  The patient's immediate postoperative recovery has been unremarkable. Since hospital discharge the patient reports .   Current Outpatient Medications: cetirizine (ZYRTEC) 10 MG tablet, Take 10 mg by mouth daily., Disp: , Rfl:  enoxaparin (LOVENOX) 120 MG/0.8ML injection, Inject 0.8 mLs (120 mg total) into the skin every 12 (twelve) hours., Disp: 67.2 mL, Rfl: 0 ibuprofen (ADVIL) 800 MG tablet, Take 1 tablet (800 mg total) by mouth every 6 (six) hours., Disp: 30 tablet, Rfl: 0 oxyCODONE-acetaminophen (PERCOCET/ROXICET) 5-325 MG tablet, Take 2 tablets by mouth every 4 (four) hours as needed for severe pain., Disp: 20 tablet, Rfl: 0  No current facility-administered medications for this visit.    Blood pressure 133/87, pulse 83, height 5\' 10"  (1.778 m), weight 257 lb 8 oz (116.8 kg), last menstrual period 09/18/2019, currently breastfeeding.  Physical Exam: Incision clean dry intact Abdomen is soft non tender  Diagnostic Tests:   Pathology:   Impression: S/p primary c section  Plan: Continue lovenox BP is good Follow up: 5 weeks    Florian Buff, MD

## 2020-06-13 ENCOUNTER — Other Ambulatory Visit: Payer: BC Managed Care – PPO | Admitting: Women's Health

## 2020-06-16 ENCOUNTER — Encounter: Payer: BC Managed Care – PPO | Admitting: Advanced Practice Midwife

## 2020-06-16 ENCOUNTER — Other Ambulatory Visit: Payer: BC Managed Care – PPO | Admitting: Advanced Practice Midwife

## 2020-06-16 ENCOUNTER — Other Ambulatory Visit: Payer: BC Managed Care – PPO

## 2020-06-20 ENCOUNTER — Other Ambulatory Visit: Payer: BC Managed Care – PPO | Admitting: Advanced Practice Midwife

## 2020-07-07 ENCOUNTER — Encounter: Payer: Self-pay | Admitting: Advanced Practice Midwife

## 2020-07-07 ENCOUNTER — Ambulatory Visit (INDEPENDENT_AMBULATORY_CARE_PROVIDER_SITE_OTHER): Payer: BC Managed Care – PPO | Admitting: Advanced Practice Midwife

## 2020-07-07 ENCOUNTER — Other Ambulatory Visit: Payer: Self-pay

## 2020-07-07 DIAGNOSIS — Z30017 Encounter for initial prescription of implantable subdermal contraceptive: Secondary | ICD-10-CM | POA: Diagnosis not present

## 2020-07-07 DIAGNOSIS — Z029 Encounter for administrative examinations, unspecified: Secondary | ICD-10-CM

## 2020-07-07 MED ORDER — ETONOGESTREL 68 MG ~~LOC~~ IMPL
68.0000 mg | DRUG_IMPLANT | Freq: Once | SUBCUTANEOUS | Status: AC
Start: 2020-07-07 — End: 2020-07-07
  Administered 2020-07-07: 68 mg via SUBCUTANEOUS

## 2020-07-07 NOTE — Progress Notes (Signed)
Post Partum Visit Note  Denise Khan is a 35 y.o. G39P1102 female who presents for a postpartum visit. She is 5 weeks postpartum following a primary cesarean section. (breech/failed ECV/labor/new onset GHTN) I have fully reviewed the prenatal and intrapartum course. She had A2DM (metformin) and was on lovenox for hx of PE). The delivery was at 36.6 gestational weeks.  Anesthesia: spinal. Postpartum course has been uneventful.  Pecola Leisure is doing well. Baby is feeding by both breast and bottle - Enfamil AR. Bleeding no bleeding. Bowel function is normal. Bladder function is normal. Patient is not sexually active. Contraception method is Nexplanon. Postpartum depression screening: negative.    Review of Systems Pertinent items are noted in HPI.   Objective:  Blood pressure 138/85, pulse 84, height 5\' 10"  (1.778 m), weight 254 lb (115.2 kg), currently breastfeeding.  General:  alert, cooperative, and no distress   Breasts:  negative  Lungs: Normal respiratory effort  Heart:  regular rate and rhythm  Abdomen: soft, non-tender, incision well healed   Vulva:  not evaluated  Vagina: not evaluated  Cervix:  normal  Corpus: Well involuted  Adnexa:  not evaluated  Rectal Exam: denies hemorrhoids        Pt desires Nexplanon.  Risks/benefits/side effects of Nexplanon have been discussed and her questions have been answered.  Specifically, a failure rate of 07/998 has been reported, with an increased failure rate if pt takes St. John's Wort and/or antiseizure medicaitons.  @NAME @ is aware of the common side effect of irregular bleeding, which the incidence of decreases over time.  Her left arm, approximatly 4 inches proximal from the elbow, was cleansed with alcohol and anesthetized with 2cc of 2% Lidocaine.  The area was cleansed again and the Nexplanon was inserted without difficulty.  A pressure bandage was applied.  Pt was instructed to remove pressure bandage in a few hours, and keep insertion  site covered with a bandaid for 3 days.  Back up contraception was recommended for 2 weeks.  Follow-up scheduled PRN problems     Assessment:    normal postpartum exam.   Hx A2DM;  Prefers to check FBS for 1 week. Let know if >95 (will do gtt )  Nexplanon insertion  Plan:   Essential components of care per ACOG recommendations:  1.  Mood and well being: Patient with negative depression screening today. Reviewed local resources for support.  - Patient does not use tobacco. If using tobacco we discussed reduction and for recently cessation risk of relapse - hx of drug use? No   If yes, discussed support systems  2. Infant care and feeding:  -Patient currently breastmilk feeding? Yes If breastmilk feeding discussed return to work and pumping. If needed, patient was provided letter for work to allow for every 2-3 hr pumping breaks, and to be granted a private location to express breastmilk and refrigerated area to store breastmilk. Reviewed importance of draining breast regularly to support lactation. -Social determinants of health (SDOH) reviewed in EPIC. No concernsThe following needs were identified  3. Sexuality, contraception and birth spacing - Patient does not want a pregnancy in the next year.  Desired family size is 2 children.  - Reviewed forms of contraception in tiered fashion. Patient desired nexplanon today.   - Discussed birth spacing of 18 months  4. Sleep and fatigue -Encouraged family/partner/community support of 4 hrs of uninterrupted sleep to help with mood and fatigue  5. Physical Recovery  - Discussed patients delivery and  complications - Patient had a n/a degree laceration, perineal healing reviewed. Patient expressed understanding - Patient has urinary incontinence? No Patient was referred to pelvic floor PT  - Patient is safe to resume physical and sexual activity  6.  Health Maintenance - Last pap smear done 2019 and was normal with negative HPV  7.  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

## 2020-07-07 NOTE — Addendum Note (Signed)
Addended by: Annamarie Dawley on: 07/07/2020 03:45 PM   Modules accepted: Orders

## 2020-07-14 ENCOUNTER — Ambulatory Visit: Payer: BC Managed Care – PPO

## 2021-02-08 ENCOUNTER — Encounter: Payer: Self-pay | Admitting: Nurse Practitioner

## 2021-02-08 ENCOUNTER — Other Ambulatory Visit: Payer: Self-pay

## 2021-02-08 ENCOUNTER — Ambulatory Visit: Payer: BC Managed Care – PPO | Admitting: Nurse Practitioner

## 2021-02-08 VITALS — BP 138/91 | HR 73 | Temp 98.6°F | Ht 70.0 in | Wt 274.0 lb

## 2021-02-08 DIAGNOSIS — Z7689 Persons encountering health services in other specified circumstances: Secondary | ICD-10-CM

## 2021-02-08 DIAGNOSIS — Z0001 Encounter for general adult medical examination with abnormal findings: Secondary | ICD-10-CM | POA: Insufficient documentation

## 2021-02-08 DIAGNOSIS — O24419 Gestational diabetes mellitus in pregnancy, unspecified control: Secondary | ICD-10-CM

## 2021-02-08 DIAGNOSIS — F419 Anxiety disorder, unspecified: Secondary | ICD-10-CM

## 2021-02-08 DIAGNOSIS — Z86711 Personal history of pulmonary embolism: Secondary | ICD-10-CM

## 2021-02-08 MED ORDER — SERTRALINE HCL 50 MG PO TABS: 50.0000 mg | ORAL_TABLET | Freq: Every day | ORAL | 3 refills | Status: DC

## 2021-02-08 NOTE — Patient Instructions (Signed)
Please have fasting labs drawn 2-3 days prior to your appointment so we can discuss the results during your office visit.  

## 2021-02-08 NOTE — Progress Notes (Signed)
New Patient Office Visit  Subjective:  Patient ID: Denise Khan, female    DOB: [birth year]  Age: 35 y.o. MRN: [id number]  CC:  Chief Complaint  Patient presents with   New Patient (Initial Visit)    Here to establish care. Wants to discuss chronic anxiety that has recently started worsening.    HPI Denise Khan presents for new patient visit. No recent PCP, physical, or labs.  Had a son born 06/02/20. No longer breast feeding.  She reports she is having anxiety that is getting worse.  Past Medical History:  Diagnosis Date   Anxiety    Blood transfusion without reported diagnosis    Breech presentation 06/02/2020   Cancer [facility])    Cesarean delivery delivered 06/02/2020   Clotting disorder ([place])    PE (pulmonary embolism)     Past Surgical History:  Procedure Laterality Date   CESAREAN SECTION  06/02/2020   Procedure: CESAREAN SECTION;  Surgeon: Donnamae Jude, MD;  Location: [facility] LD [facility]S;  Service: Obstetrics;;   KNEE SURGERY     PORT-A-CATH REMOVAL      Family History  Problem Relation Age of Onset   CAD Father    Hypertension Father    Heart disease Father    Hypertension Mother    Cancer Maternal Grandmother        lung   Stroke Maternal Grandfather    Hypertension Maternal Grandfather    Kidney disease Maternal Grandfather    Hernia Son     Social History   Socioeconomic History   Marital status: Significant Other    Spouse name: Not on file   Number of children: 2   Years of education: 16   Highest education level: Not on file  Occupational History   Occupation: Patent attorney    Comment: Psychologist, 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  Tobacco Use   Smoking status: Never   Smokeless tobacco: Never  Vaping Use   Vaping Use: Never used  Substance and Sexual Activity   Alcohol use: Yes    Comment: 1-2 drinks per month   Drug use: Not Currently   Sexual activity: Yes    Birth control/protection: None, Implant    Comment: Nexplanon  Other Topics Concern   Not  on file  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 with 78 yo son Duerksen; has infant son as well   His dad is Jeneen Rinks, lives with them   Social Determinants of Radio broadcast assistant Strain: Not on file  Food Insecurity: No Food Insecurity   Worried About Charity fundraiser in the Last Year: Never true   Arboriculturist in the Last Year: Never true  Transportation Needs: No Transportation Needs   Lack of Transportation (Medical): No   Lack of Transportation (Non-Medical): No  Physical Activity: Insufficiently Active   Days of Exercise per Week: 1 day   Minutes of Exercise per Session: 10 min  Stress: No Stress Concern Present   Feeling of Stress : Only a little  Social Connections: Moderately Integrated   Frequency of Communication with Friends and Family: Three times a week   Frequency of Social Gatherings with Friends and Family: Once a week   Attends Religious Services: 1 to 4 times per year   Active Member of [employer] or Organizations: No   Attends Archivist Meetings: Never   Marital Status: Living with partner  Intimate Partner Violence: Not At Risk   Fear  of Current or Ex-Partner: No   Emotionally Abused: No   Physically Abused: No   Sexually Abused: No    ROS Review of Systems  Constitutional: Negative.   Respiratory: Negative.    Cardiovascular: Negative.   Musculoskeletal: Negative.   Psychiatric/Behavioral:  Negative for self-injury and suicidal ideas. The patient is nervous/anxious.    Objective:   Today's Vitals: BP (!) 138/91 (BP Location: Left Arm, Patient Position: Sitting, Cuff Size: Large)   Pulse 73   Temp 98.6 F (37 C) (Temporal)   Ht 5' 10" (1.778 m)   Wt 274 lb (124.3 kg)   LMP 02/04/2021 (Exact Date)   SpO2 97%   Breastfeeding No   BMI 39.31 kg/m   Physical Exam Constitutional:      Appearance: Normal appearance. She is obese.  Cardiovascular:     Rate and Rhythm: Normal rate and regular rhythm.     Pulses:  Normal pulses.     Heart sounds: Normal heart sounds.  Pulmonary:     Effort: Pulmonary effort is normal.     Breath sounds: Normal breath sounds.  Neurological:     Mental Status: She is alert.  Psychiatric:        Behavior: Behavior normal.        Thought Content: Thought content normal.        Judgment: Judgment normal.     Comments: GAD-7 = 13    Assessment & Plan:   Problem List Items Addressed This Visit       Endocrine   Gestational diabetes mellitus, class A2    -will check A1c with routine labs       Relevant Orders   Hemoglobin A1c   Microalbumin / creatinine urine ratio     Other   History of pulmonary embolus (PE)    -takes xarelto; had PE x 2 (hx noted in overview)       Relevant Orders   CBC with Differential/Platelet   Establishing care with new doctor, encounter for - Primary    -obtain records       Relevant Orders   CBC with Differential/Platelet   CMP14+EGFR   Lipid Panel With LDL/HDL Ratio   TSH   Anxiety    -GAD-7 = 13 -she tried lexapro in the past, but that didn't help much -Rx. Sertraline -we discussed therapy, and she states she will consider this further; she would like someone out of town since she works at [employer]-- recommended Dr. McKnight in [place]        Relevant Medications   sertraline (ZOLOFT) 50 MG tablet    Outpatient Encounter Medications as of 02/08/2021  Medication Sig   cetirizine (ZYRTEC) 10 MG tablet Take 10 mg by mouth daily.   rivaroxaban (XARELTO) 20 MG TABS tablet Take 20 mg by mouth daily with supper.   sertraline (ZOLOFT) 50 MG tablet Take 1 tablet (50 mg total) by mouth daily.   [DISCONTINUED] enoxaparin (LOVENOX) 120 MG/0.8ML injection Inject 0.8 mLs (120 mg total) into the skin every 12 (twelve) hours. (Patient not taking: Reported on 02/08/2021)   [DISCONTINUED] ibuprofen (ADVIL) 800 MG tablet Take 1 tablet (800 mg total) by mouth every 6 (six) hours. (Patient not taking: Reported on 02/08/2021)    [DISCONTINUED] oxyCODONE-acetaminophen (PERCOCET/ROXICET) 5-325 MG tablet Take 2 tablets by mouth every 4 (four) hours as needed for severe pain. (Patient not taking: Reported on 02/08/2021)   No facility-administered encounter medications on file as of 02/08/2021.    Follow-up:   Return in about 1 month (around 03/11/2021) for Physical Exam (anxiety, gest diabetes f/u).   Noreene Larsson, NP

## 2021-02-08 NOTE — Assessment & Plan Note (Signed)
-  obtain records 

## 2021-02-08 NOTE — Assessment & Plan Note (Signed)
-  takes xarelto; had PE x 2 (hx noted in overview)

## 2021-02-08 NOTE — Assessment & Plan Note (Signed)
-  will check A1c with routine labs

## 2021-02-08 NOTE — Assessment & Plan Note (Addendum)
-  GAD-7 = 13 -she tried lexapro in the past, but that didn't help much -Rx. Sertraline -we discussed therapy, and she states she will consider this further; she would like someone out of town since she works at [employer]-- recommended Dr. Pablo Ledger in [place]

## 2021-02-14 ENCOUNTER — Ambulatory Visit
Admission: EM | Admit: 2021-02-14 | Discharge: 2021-02-14 | Disposition: A | Discharge status: Home / Self Care | Payer: BC Managed Care – PPO | Attending: Family Medicine | Admitting: Family Medicine

## 2021-02-14 ENCOUNTER — Other Ambulatory Visit: Payer: Self-pay

## 2021-02-14 ENCOUNTER — Encounter: Payer: Self-pay | Admitting: Emergency Medicine

## 2021-02-14 DIAGNOSIS — L2089 Other atopic dermatitis: Secondary | ICD-10-CM | POA: Diagnosis not present

## 2021-02-14 MED ORDER — TRIAMCINOLONE ACETONIDE 0.025 % EX OINT: 1.0000 "application " | TOPICAL_OINTMENT | Freq: Three times a day (TID) | CUTANEOUS | 0 refills | Status: DC

## 2021-02-14 MED ORDER — PREDNISONE 20 MG PO TABS: ORAL_TABLET | ORAL | 0 refills | Status: DC

## 2021-02-14 NOTE — ED Triage Notes (Signed)
Pt is present today with a rash on the left side of her body. Pt states that she noticed the rash a couple days ago. Pt states that rash is itchy not painful.

## 2021-02-14 NOTE — ED Provider Notes (Signed)
[facility]    CSN: [id number] Arrival date & time: 02/14/21  1111      History   Chief Complaint Chief Complaint  Patient presents with   Rash    HPI Denise Khan is a 35 y.o. female.   HPI Patient presents today evaluation of rash involving the left lateral body and lower leg. The rash is itchy and painful. No drainage or blistering  from the papules. No one in the home has similar type rash. No recent changes in detergents or lotions.  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 Authorizing Provider  predniSONE (DELTASONE) 20 MG tablet Take 3 PO QAM x3days, 2 PO QAM x3days, 1 PO QAM x3days 02/14/21  Yes Scot Jun, FNP  triamcinolone (KENALOG) 0.025 % ointment Apply 1 application topically 3 (three) times daily. 02/14/21  Yes Scot Jun, FNP  cetirizine (ZYRTEC) 10 MG tablet Take  10 mg by mouth daily.    [provider]  rivaroxaban (XARELTO) 20 MG TABS tablet Take 20 mg by mouth daily with supper.    [provider]  sertraline (ZOLOFT) 50 MG tablet Take 1 tablet (50 mg total) by mouth daily. 02/08/21   Noreene Larsson, NP    Family History Family History  Problem Relation Age of Onset   CAD Father    Hypertension Father    Heart disease Father    Hypertension Mother    Cancer Maternal Grandmother        lung   Stroke Maternal Grandfather    Hypertension Maternal Grandfather    Kidney disease Maternal Grandfather    Hernia Son     Social History Social History   Tobacco Use   Smoking status: Never   Smokeless tobacco: Never  Vaping Use   Vaping Use: Never used  Substance Use Topics   Alcohol use: Yes    Comment: 1-2 drinks per month   Drug use: Not Currently     Allergies   Patient has no known allergies.   Review of Systems Review of  Systems Pertinent negatives listed in HPI   Physical Exam Triage Vital Signs ED Triage Vitals  Enc Vitals Group     BP 02/14/21 1137 (!) 136/91     Pulse Rate 02/14/21 1137 80     Resp 02/14/21 1137 17     Temp 02/14/21 1137 98.5 F (36.9 C)     Temp src --      SpO2 02/14/21 1137 98 %     Weight --      Height --      Head Circumference --      Peak Flow --      Pain Score 02/14/21 1136 0     Pain Loc --      Pain Edu? --      Excl. in [place]? --    No data found.  Updated Vital Signs BP (!) 136/91   Pulse 80   Temp 98.5 F (36.9 C)   Resp 17   LMP 02/04/2021 (Exact Date)   SpO2 98%   Breastfeeding No   Visual Acuity Right Eye Distance:   Left Eye Distance:   Bilateral Distance:    Right Eye Near:   Left Eye Near:    Bilateral Near:     Physical Exam General appearance: Alert, well developed, well nourished, cooperative  Head: Normocephalic, without obvious abnormality, atraumatic Respiratory: Respirations even and unlabored, normal respiratory rate Heart:  Rate and rhythm normal.  Skin:non pustular, non vesicular -multiple isolated papule lesion present  Psych: Appropriate mood and affect.   UC Treatments / Results  Labs (all labs ordered are listed, but only abnormal results are displayed) Labs Reviewed - No data to display  EKG   Radiology No results found.  Procedures Procedures (including critical care time)  Medications Ordered in UC Medications - No data to display  Initial Impression / Assessment and Plan / UC Course  I have reviewed the triage vital signs and the nursing notes.  Pertinent labs & imaging results that were available during my care of the patient were reviewed by me and considered in my medical decision making (see chart for details).    Papular atopic dermatitis  Treatment per discharge instructions RTC as needed Final Clinical Impressions(s) / UC Diagnoses   Final diagnoses:  Papular atopic dermatitis   Discharge Instructions   None    ED Prescriptions     Medication Sig Dispense Auth. Provider   predniSONE (DELTASONE) 20 MG tablet Take 3 PO QAM x3days, 2 PO QAM x3days, 1 PO QAM x3days 18 tablet Scot Jun, FNP   triamcinolone (KENALOG) 0.025 % ointment Apply 1 application topically 3 (three) times daily. 454 g Scot Jun, FNP      PDMP not reviewed this encounter.   Scot Jun, [place] 02/21/21 2132

## 2021-03-14 NOTE — Progress Notes (Signed)
Labs look good.

## 2021-03-15 ENCOUNTER — Other Ambulatory Visit: Payer: Self-pay

## 2021-03-15 ENCOUNTER — Encounter: Payer: Self-pay | Admitting: Nurse Practitioner

## 2021-03-15 ENCOUNTER — Ambulatory Visit (INDEPENDENT_AMBULATORY_CARE_PROVIDER_SITE_OTHER): Payer: BC Managed Care – PPO | Admitting: Nurse Practitioner

## 2021-03-15 DIAGNOSIS — F419 Anxiety disorder, unspecified: Secondary | ICD-10-CM

## 2021-03-15 DIAGNOSIS — Z0001 Encounter for general adult medical examination with abnormal findings: Secondary | ICD-10-CM | POA: Diagnosis not present

## 2021-03-15 LAB — CMP14+EGFR
ALT: 13 IU/L (ref 0–32)
AST: 18 IU/L (ref 0–40)
Albumin/Globulin Ratio: 2.1 (ref 1.2–2.2)
Albumin: 4.4 g/dL (ref 3.8–4.8)
Alkaline Phosphatase: 65 IU/L (ref 44–121)
BUN/Creatinine Ratio: 15 (ref 9–23)
BUN: 14 mg/dL (ref 6–20)
Bilirubin Total: 0.3 mg/dL (ref 0.0–1.2)
CO2: 19 mmol/L — ABNORMAL LOW (ref 20–29)
Calcium: 9.2 mg/dL (ref 8.7–10.2)
Chloride: 103 mmol/L (ref 96–106)
Creatinine, Ser: 0.95 mg/dL (ref 0.57–1.00)
Globulin, Total: 2.1 g/dL (ref 1.5–4.5)
Glucose: 101 mg/dL — ABNORMAL HIGH (ref 65–99)
Potassium: 4.6 mmol/L (ref 3.5–5.2)
Sodium: 138 mmol/L (ref 134–144)
Total Protein: 6.5 g/dL (ref 6.0–8.5)
eGFR: 80 mL/min/{1.73_m2} (ref >59)

## 2021-03-15 LAB — HEMOGLOBIN A1C
Est. average glucose Bld gHb Est-mCnc: 117 mg/dL
Hgb A1c MFr Bld: 5.7 % — ABNORMAL HIGH (ref 4.8–5.6)

## 2021-03-15 LAB — TSH: TSH: 0.797 u[IU]/mL (ref 0.450–4.500)

## 2021-03-15 LAB — CBC WITH DIFFERENTIAL/PLATELET
Basophils Absolute: 0 10*3/uL (ref 0.0–0.2)
Basos: 1 %
EOS (ABSOLUTE): 0.2 10*3/uL (ref 0.0–0.4)
Eos: 4 %
Hematocrit: 41 % (ref 34.0–46.6)
Hemoglobin: 13.9 g/dL (ref 11.1–15.9)
Immature Grans (Abs): 0 10*3/uL (ref 0.0–0.1)
Immature Granulocytes: 0 %
Lymphocytes Absolute: 1.6 10*3/uL (ref 0.7–3.1)
Lymphs: 37 %
MCH: 32.4 pg (ref 26.6–33.0)
MCHC: 33.9 g/dL (ref 31.5–35.7)
MCV: 96 fL (ref 79–97)
Monocytes Absolute: 0.4 10*3/uL (ref 0.1–0.9)
Monocytes: 9 %
Neutrophils Absolute: 2.1 10*3/uL (ref 1.4–7.0)
Neutrophils: 49 %
Platelets: 284 10*3/uL (ref 150–450)
RBC: 4.29 x10E6/uL (ref 3.77–5.28)
RDW: 12.3 % (ref 11.7–15.4)
WBC: 4.2 10*3/uL (ref 3.4–10.8)

## 2021-03-15 LAB — LIPID PANEL WITH LDL/HDL RATIO
Cholesterol, Total: 143 mg/dL (ref 100–199)
HDL: 45 mg/dL (ref >39)
LDL Chol Calc (NIH): 88 mg/dL (ref 0–99)
LDL/HDL Ratio: 2 ratio (ref 0.0–3.2)
Triglycerides: 44 mg/dL (ref 0–149)
VLDL Cholesterol Cal: 10 mg/dL (ref 5–40)

## 2021-03-15 LAB — MICROALBUMIN / CREATININE URINE RATIO
Creatinine, Urine: 152.7 mg/dL
Microalb/Creat Ratio: 4 mg/g creat (ref 0–29)
Microalbumin, Urine: 6 ug/mL

## 2021-03-15 MED ORDER — SERTRALINE HCL 100 MG PO TABS: 100.0000 mg | ORAL_TABLET | Freq: Every day | ORAL | 1 refills | Status: DC

## 2021-03-15 NOTE — Progress Notes (Signed)
Acute Office Visit  Subjective:    Patient ID: Denise Khan, female    DOB: 1986/04/28, 35 y.o.   MRN: [id number]  Chief Complaint  Patient presents with   Annual Exam    CPE     HPI Patient is in today for physical exam. At last OV she was started on sertraline for anxiety. She states she has had less days with anxiety since her last visit.  No acute concerns. She sees GYN for PAP.  Past Medical History:  Diagnosis Date   Anxiety    Blood transfusion without reported diagnosis    Breech presentation 06/02/2020   Cancer [facility])    Cesarean delivery delivered 06/02/2020   Clotting disorder ([place])    PE (pulmonary embolism)     Past Surgical History:  Procedure Laterality Date   CESAREAN SECTION  06/02/2020   Procedure: CESAREAN SECTION;  Surgeon: Donnamae Jude, MD;  Location: [facility] LD [facility]S;  Service: Obstetrics;;   KNEE SURGERY     PORT-A-CATH REMOVAL      Family History  Problem Relation Age of Onset   CAD Father    Hypertension Father    Heart disease Father    Hypertension Mother    Cancer Maternal Grandmother        lung   Stroke Maternal Grandfather    Hypertension Maternal Grandfather    Kidney disease Maternal Grandfather    Hernia Son     Social History   Socioeconomic History   Marital status: Significant Other    Spouse name: Not on file   Number of children: 2   Years of education: 16   Highest education level: Not on file  Occupational History   Occupation: Patent attorney    Comment: Psychologist, 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  Tobacco Use   Smoking status: Never   Smokeless tobacco: Never  Vaping Use   Vaping Use: Never used  Substance and Sexual Activity   Alcohol use: Yes    Comment: 1-2 drinks per month   Drug use: Not Currently   Sexual activity: Yes    Birth control/protection: None, Implant    Comment: Nexplanon  Other Topics Concern   Not on file  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 with 43 yo son Cherian; has infant son as well    His dad is Jeneen Rinks, lives with them   Social Determinants of Radio broadcast assistant Strain: Not on file  Food Insecurity: No Food Insecurity   Worried About Charity fundraiser in the Last Year: Never true   Arboriculturist in the Last Year: Never true  Transportation Needs: No Transportation Needs   Lack of Transportation (Medical): No   Lack of Transportation (Non-Medical): No  Physical Activity: Insufficiently Active   Days of Exercise per Week: 1 day   Minutes of Exercise per Session: 10 min  Stress: No Stress Concern Present   Feeling of Stress : Only a little  Social Connections: Moderately Integrated   Frequency of Communication with Friends and Family: Three times a week   Frequency of Social Gatherings with Friends and Family: Once a week   Attends Religious Services: 1 to 4 times per year   Active Member of [employer] or Organizations: No   Attends Archivist Meetings: Never   Marital Status: Living with partner  Intimate Partner Violence: Not At Risk   Fear of Current or Ex-Partner: No   Emotionally Abused: No  Physically Abused: No   Sexually Abused: No    Outpatient Medications Prior to Visit  Medication Sig Dispense Refill   cetirizine (ZYRTEC) 10 MG tablet Take 10 mg by mouth daily.     rivaroxaban (XARELTO) 20 MG TABS tablet Take 20 mg by mouth daily with supper.     triamcinolone (KENALOG) 0.025 % ointment Apply 1 application topically 3 (three) times daily. 454 g 0   sertraline (ZOLOFT) 50 MG tablet Take 1 tablet (50 mg total) by mouth daily. 30 tablet 3   predniSONE (DELTASONE) 20 MG tablet Take 3 PO QAM x3days, 2 PO QAM x3days, 1 PO QAM x3days (Patient not taking: Reported on 03/15/2021) 18 tablet 0   No facility-administered medications prior to visit.    No Known Allergies  Review of Systems  Constitutional: Negative.   HENT: Negative.    Eyes: Negative.   Respiratory: Negative.    Cardiovascular: Negative.   Gastrointestinal:  Negative.   Endocrine: Negative.   Genitourinary: Negative.   Musculoskeletal: Negative.   Skin: Negative.   Allergic/Immunologic: Negative.   Neurological: Negative.   Hematological: Negative.   Psychiatric/Behavioral:  Negative for self-injury and suicidal ideas. The patient is nervous/anxious.       Objective:    Physical Exam Constitutional:      Appearance: Normal appearance. She is obese.  HENT:     Head: Normocephalic and atraumatic.     Right Ear: Tympanic membrane, ear canal and external ear normal.     Left Ear: Tympanic membrane, ear canal and external ear normal.     Nose: Nose normal.     Mouth/Throat:     Mouth: Mucous membranes are moist.     Pharynx: Oropharynx is clear.  Eyes:     Extraocular Movements: Extraocular movements intact.     Conjunctiva/sclera: Conjunctivae normal.     Pupils: Pupils are equal, round, and reactive to light.  Cardiovascular:     Rate and Rhythm: Normal rate and regular rhythm.     Pulses: Normal pulses.     Heart sounds: Normal heart sounds.  Pulmonary:     Effort: Pulmonary effort is normal.     Breath sounds: Normal breath sounds.  Abdominal:     General: Abdomen is flat. Bowel sounds are normal.     Palpations: Abdomen is soft.  Musculoskeletal:        General: Normal range of motion.     Cervical back: Normal range of motion and neck supple.  Skin:    General: Skin is warm and dry.     Capillary Refill: Capillary refill takes less than 2 seconds.  Neurological:     General: No focal deficit present.     Mental Status: She is alert and oriented to person, place, and time.     Cranial Nerves: No cranial nerve deficit.     Sensory: No sensory deficit.     Motor: No weakness.     Coordination: Coordination normal.     Gait: Gait normal.  Psychiatric:        Mood and Affect: Mood normal.        Behavior: Behavior normal.        Thought Content: Thought content normal.        Judgment: Judgment normal.     Comments:  GAD-7 = 11    BP (!) 147/93 (BP Location: Left Arm, Patient Position: Sitting, Cuff Size: Large)   Pulse 68   Temp 98.4 F (36.9 C) (Oral)  Ht '5\' 10"'  (1.778 m)   Wt 268 lb (121.6 kg)   LMP 03/10/2021 (Exact Date)   SpO2 97%   Breastfeeding No   BMI 38.45 kg/m  Wt Readings from Last 3 Encounters:  03/15/21 268 lb (121.6 kg)  02/08/21 274 lb (124.3 kg)  07/07/20 254 lb (115.2 kg)    Health Maintenance Due  Topic Date Due   PAP SMEAR-Modifier  03/10/2021    There are no preventive care reminders to display for this patient.   Lab Results  Component Value Date   TSH 0.797 03/13/2021   Lab Results  Component Value Date   WBC 4.2 03/13/2021   HGB 13.9 03/13/2021   HCT 41.0 03/13/2021   MCV 96 03/13/2021   PLT 284 03/13/2021   Lab Results  Component Value Date   NA 138 03/13/2021   K 4.6 03/13/2021   CO2 19 (L) 03/13/2021   GLUCOSE 101 (H) 03/13/2021   BUN 14 03/13/2021   CREATININE 0.95 03/13/2021   BILITOT 0.3 03/13/2021   ALKPHOS 65 03/13/2021   AST 18 03/13/2021   ALT 13 03/13/2021   PROT 6.5 03/13/2021   ALBUMIN 4.4 03/13/2021   CALCIUM 9.2 03/13/2021   ANIONGAP 10 06/02/2020   EGFR 80 03/13/2021   Lab Results  Component Value Date   CHOL 143 03/13/2021   Lab Results  Component Value Date   HDL 45 03/13/2021   Lab Results  Component Value Date   LDLCALC 88 03/13/2021   Lab Results  Component Value Date   TRIG 44 03/13/2021   No results found for: CHOLHDL Lab Results  Component Value Date   HGBA1C 5.7 (H) 03/13/2021       Assessment & Plan:   Problem List Items Addressed This Visit       Other   Encounter for general adult medical examination with abnormal findings    -GAD-7 = 11 today, and she reports anxiety      Anxiety    -INCREASE sertraline -GAD-7 = 11 (13) -she noticed some improvement, but not quite where she wants to be -less anxious days, but on the days when she feels anxious, they are more intense       Relevant Medications   sertraline (ZOLOFT) 100 MG tablet     Meds ordered this encounter  Medications   sertraline (ZOLOFT) 100 MG tablet    Sig: Take 1 tablet (100 mg total) by mouth daily.    Dispense:  30 tablet    Refill:  1    Stop previous 50 mg dose     Noreene Larsson, NP

## 2021-03-15 NOTE — Assessment & Plan Note (Signed)
-  GAD-7 = 11 today, and she reports anxiety

## 2021-03-15 NOTE — Assessment & Plan Note (Signed)
-  INCREASE sertraline -GAD-7 = 11 (13) -she noticed some improvement, but not quite where she wants to be -less anxious days, but on the days when she feels anxious, they are more intense

## 2021-04-20 ENCOUNTER — Ambulatory Visit: Payer: BC Managed Care – PPO | Admitting: Internal Medicine

## 2021-06-21 ENCOUNTER — Encounter (HOSPITAL_COMMUNITY): Payer: Self-pay

## 2021-06-21 ENCOUNTER — Emergency Department (HOSPITAL_COMMUNITY)
Admission: EM | Admit: 2021-06-21 | Discharge: 2021-06-21 | Disposition: A | Discharge status: Home / Self Care | Payer: BC Managed Care – PPO | Attending: Emergency Medicine | Admitting: Emergency Medicine

## 2021-06-21 ENCOUNTER — Emergency Department (HOSPITAL_COMMUNITY): Payer: BC Managed Care – PPO

## 2021-06-21 DIAGNOSIS — Z7901 Long term (current) use of anticoagulants: Secondary | ICD-10-CM | POA: Insufficient documentation

## 2021-06-21 DIAGNOSIS — Z86711 Personal history of pulmonary embolism: Secondary | ICD-10-CM | POA: Diagnosis not present

## 2021-06-21 DIAGNOSIS — R1084 Generalized abdominal pain: Secondary | ICD-10-CM | POA: Diagnosis present

## 2021-06-21 DIAGNOSIS — K529 Noninfective gastroenteritis and colitis, unspecified: Secondary | ICD-10-CM | POA: Diagnosis not present

## 2021-06-21 DIAGNOSIS — Z859 Personal history of malignant neoplasm, unspecified: Secondary | ICD-10-CM | POA: Insufficient documentation

## 2021-06-21 DIAGNOSIS — I10 Essential (primary) hypertension: Secondary | ICD-10-CM | POA: Diagnosis not present

## 2021-06-21 DIAGNOSIS — Z20822 Contact with and (suspected) exposure to covid-19: Secondary | ICD-10-CM | POA: Diagnosis not present

## 2021-06-21 LAB — COMPREHENSIVE METABOLIC PANEL
ALT: 21 U/L (ref 0–44)
AST: 21 U/L (ref 15–41)
Albumin: 4.4 g/dL (ref 3.5–5.0)
Alkaline Phosphatase: 62 U/L (ref 38–126)
Anion gap: 14 (ref 5–15)
BUN: 14 mg/dL (ref 6–20)
CO2: 13 mmol/L — ABNORMAL LOW (ref 22–32)
Calcium: 9.2 mg/dL (ref 8.9–10.3)
Chloride: 111 mmol/L (ref 98–111)
Creatinine, Ser: 0.8 mg/dL (ref 0.44–1.00)
GFR, Estimated: >60 mL/min (ref >60)
Glucose, Bld: 115 mg/dL — ABNORMAL HIGH (ref 70–99)
Potassium: 3.9 mmol/L (ref 3.5–5.1)
Sodium: 138 mmol/L (ref 135–145)
Total Bilirubin: 0.4 mg/dL (ref 0.3–1.2)
Total Protein: 8.1 g/dL (ref 6.5–8.1)

## 2021-06-21 LAB — LIPASE, BLOOD: Lipase: 36 U/L (ref 11–51)

## 2021-06-21 LAB — RESP PANEL BY RT-PCR (FLU A&B, COVID) ARPGX2
Influenza A by PCR: NEGATIVE
Influenza B by PCR: NEGATIVE
SARS Coronavirus 2 by RT PCR: NEGATIVE

## 2021-06-21 LAB — URINALYSIS, ROUTINE W REFLEX MICROSCOPIC
Bilirubin Urine: NEGATIVE
Glucose, UA: NEGATIVE mg/dL
Ketones, ur: 40 mg/dL — AB
Leukocytes,Ua: NEGATIVE
Nitrite: NEGATIVE
Protein, ur: NEGATIVE mg/dL
Specific Gravity, Urine: >1.03 — ABNORMAL HIGH (ref 1.005–1.030)
pH: 5.5 (ref 5.0–8.0)

## 2021-06-21 LAB — URINALYSIS, MICROSCOPIC (REFLEX)

## 2021-06-21 LAB — CBC WITH DIFFERENTIAL/PLATELET
Abs Immature Granulocytes: 0.03 10*3/uL (ref 0.00–0.07)
Basophils Absolute: 0 10*3/uL (ref 0.0–0.1)
Basophils Relative: 0 %
Eosinophils Absolute: 0 10*3/uL (ref 0.0–0.5)
Eosinophils Relative: 0 %
HCT: 47.3 % — ABNORMAL HIGH (ref 36.0–46.0)
Hemoglobin: 15.5 g/dL — ABNORMAL HIGH (ref 12.0–15.0)
Immature Granulocytes: 0 %
Lymphocytes Relative: 5 %
Lymphs Abs: 0.5 10*3/uL — ABNORMAL LOW (ref 0.7–4.0)
MCH: 32.5 pg (ref 26.0–34.0)
MCHC: 32.8 g/dL (ref 30.0–36.0)
MCV: 99.2 fL (ref 80.0–100.0)
Monocytes Absolute: 0.3 10*3/uL (ref 0.1–1.0)
Monocytes Relative: 3 %
Neutro Abs: 8.6 10*3/uL — ABNORMAL HIGH (ref 1.7–7.7)
Neutrophils Relative %: 92 %
Platelets: 258 10*3/uL (ref 150–400)
RBC: 4.77 MIL/uL (ref 3.87–5.11)
RDW: 12.2 % (ref 11.5–15.5)
WBC: 9.5 10*3/uL (ref 4.0–10.5)
nRBC: 0 % (ref 0.0–0.2)

## 2021-06-21 LAB — PREGNANCY, URINE: Preg Test, Ur: NEGATIVE

## 2021-06-21 MED ORDER — PANTOPRAZOLE SODIUM 40 MG IV SOLR
40.0000 mg | Freq: Once | INTRAVENOUS | Status: AC
  Administered 2021-06-21: 13:00:00 40 mg via INTRAVENOUS
  Filled 2021-06-21: qty 40

## 2021-06-21 MED ORDER — PANTOPRAZOLE SODIUM 20 MG PO TBEC: 20.0000 mg | DELAYED_RELEASE_TABLET | Freq: Every day | ORAL | 0 refills | Status: DC

## 2021-06-21 MED ORDER — ONDANSETRON HCL 4 MG/2ML IJ SOLN
4.0000 mg | Freq: Once | INTRAMUSCULAR | Status: AC
  Administered 2021-06-21: 13:00:00 4 mg via INTRAVENOUS
  Filled 2021-06-21: qty 2

## 2021-06-21 MED ORDER — ONDANSETRON HCL 4 MG PO TABS: 4.0000 mg | ORAL_TABLET | Freq: Four times a day (QID) | ORAL | 0 refills | Status: DC

## 2021-06-21 MED ORDER — MORPHINE SULFATE (PF) 4 MG/ML IV SOLN
4.0000 mg | Freq: Once | INTRAVENOUS | Status: AC
  Administered 2021-06-21: 16:00:00 4 mg via INTRAVENOUS
  Filled 2021-06-21: qty 1

## 2021-06-21 MED ORDER — IOHEXOL 300 MG/ML  SOLN
100.0000 mL | Freq: Once | INTRAMUSCULAR | Status: AC | PRN
  Administered 2021-06-21: 15:00:00 100 mL via INTRAVENOUS

## 2021-06-21 MED ORDER — SODIUM CHLORIDE 0.9 % IV BOLUS
1000.0000 mL | Freq: Once | INTRAVENOUS | Status: AC
  Administered 2021-06-21: 13:00:00 1000 mL via INTRAVENOUS

## 2021-06-21 NOTE — ED Provider Notes (Signed)
[facility] Provider Note   CSN: [id number] Arrival date & time: 06/21/21  1054     History Chief Complaint  Patient presents with   Abdominal Pain    Denise Khan is a 35 y.o. female.   Abdominal Pain Associated symptoms: diarrhea and nausea   Associated symptoms: no chest pain, no chills, no cough, no dysuria, no fatigue, no fever, no hematuria, no shortness of breath, no sore throat and no vomiting        Denise Khan is a 35 y.o. female with past medical history significant for AML and prior PE currently anticoagulated, who presents to the Emergency Department complaining of nausea, diarrhea and diffuse abdominal pain.  Onset of symptoms began at 6 AM this morning.  She notes having nausea and multiple episodes of dry heaves with intermittent "knotting up" to her stomach.  No vomiting since pain began.  Few episodes of loose stools.  Denies bloody or black colored stools.  States that everyone in the household ate the same food last evening.  No other household members are sick.  She denies cough, chest pain, dysuria or flank pain, fever or chills.  Nothing makes her symptoms better or worse.   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   History of pulmonary embolus (PE) 01/05/2013    Past Surgical History:  Procedure Laterality Date   CESAREAN SECTION  06/02/2020   Procedure: CESAREAN SECTION;  Surgeon: Donnamae Jude, MD;  Location: [facility] LD ORS;  Service: Obstetrics;;   KNEE SURGERY     PORT-A-CATH REMOVAL       OB History     Gravida  2   Para  2   Term  1   Preterm  1   AB      Living  2      SAB      IAB      Ectopic      Multiple  0   Live Births  2           Family History  Problem Relation Age of Onset   CAD Father    Hypertension Father    Heart disease Father    Hypertension Mother    Cancer Maternal Grandmother        lung   Stroke Maternal Grandfather    Hypertension Maternal Grandfather    Kidney disease Maternal Grandfather    Hernia Son     Social History   Tobacco Use   Smoking status: Never   Smokeless tobacco: Never  Vaping Use   Vaping Use: Never used  Substance Use Topics   Alcohol use: Yes    Comment: 1-2 drinks per month   Drug use: Not Currently    Home Medications Prior to Admission medications   Medication Sig Start Date End Date Taking? Authorizing Provider  cetirizine (ZYRTEC) 10 MG tablet Take 10 mg by mouth daily.  [provider]  rivaroxaban (XARELTO) 20 MG TABS tablet Take 20 mg by mouth daily with supper.    [provider]  sertraline (ZOLOFT) 100 MG tablet Take 1 tablet (100 mg total) by mouth daily. 03/15/21   Noreene Larsson, NP  triamcinolone (KENALOG) 0.025 % ointment Apply 1 application topically 3 (three) times daily. 02/14/21   Scot Jun, FNP    Allergies    Patient has no known allergies.  Review of Systems   Review of Systems  Constitutional:  Positive for appetite change. Negative for chills, fatigue and fever.  HENT:  Negative for sore throat and trouble swallowing.   Respiratory:  Negative for cough and shortness of breath.   Cardiovascular:  Negative for chest pain.  Gastrointestinal:  Positive for abdominal pain, diarrhea and nausea. Negative for blood in stool and vomiting.  Genitourinary:  Negative for dysuria, flank pain and hematuria.  Musculoskeletal:  Negative for  arthralgias, back pain, myalgias, neck pain and neck stiffness.  Skin:  Negative for rash.  Neurological:  Negative for dizziness, weakness and numbness.  Hematological:  Does not bruise/bleed easily.  All other systems reviewed and are negative.  Physical Exam Updated Vital Signs BP (!) 162/121 (BP Location: Right Arm)    Pulse 82    Temp 97.8 F (36.6 C) (Oral)    Resp 18    Ht 5\' 10"  (1.778 m)    Wt 113.4 kg    LMP 06/08/2021    SpO2 100%    BMI 35.87 kg/m   Physical Exam Vitals and nursing note reviewed.  Constitutional:      Appearance: Normal appearance. She is not toxic-appearing.     Comments: Patient appears uncomfortable, writhing around on the stretcher.  Tearful during exam.  HENT:     Mouth/Throat:     Mouth: Mucous membranes are dry.     Pharynx: No oropharyngeal exudate or posterior oropharyngeal erythema.     Comments: Mucous membranes appear slightly dry Cardiovascular:     Rate and Rhythm: Normal rate and regular rhythm.     Pulses: Normal pulses.  Pulmonary:     Effort: Pulmonary effort is normal.     Breath sounds: Normal breath sounds.  Abdominal:     General: There is no distension.     Palpations: Abdomen is soft.     Tenderness: There is abdominal tenderness (Diffuse tenderness to mild palpation of the entire abdomen.  No guarding or rebound tenderness.). There is no right CVA tenderness, left CVA tenderness or guarding.  Musculoskeletal:        General: Normal range of motion.  Skin:    General: Skin is warm.     Capillary Refill: Capillary refill takes less than 2 seconds.  Neurological:     General: No focal deficit present.     Mental Status: She is alert.     Sensory: No sensory deficit.     Motor: No weakness.    ED Results / Procedures / Treatments   Labs (all labs ordered are listed, but only abnormal results are displayed) Labs Reviewed  COMPREHENSIVE METABOLIC PANEL - Abnormal; Notable for the following components:      Result Value    CO2 13 (*)    Glucose, Bld 115 (*)    All other components within normal limits  CBC WITH DIFFERENTIAL/PLATELET - Abnormal; Notable for the following components:   Hemoglobin 15.5 (*)    HCT 47.3 (*)    Neutro Abs 8.6 (*)  Lymphs Abs 0.5 (*)    All other components within normal limits  URINALYSIS, ROUTINE W REFLEX MICROSCOPIC - Abnormal; Notable for the following components:   Specific Gravity, Urine >1.030 (*)    Hgb urine dipstick SMALL (*)    Ketones, ur 40 (*)    All other components within normal limits  URINALYSIS, MICROSCOPIC (REFLEX) - Abnormal; Notable for the following components:   Bacteria, UA MANY (*)    All other components within normal limits  RESP PANEL BY RT-PCR (FLU A&B, COVID) ARPGX2  LIPASE, BLOOD  PREGNANCY, URINE    EKG None  Radiology CT ABDOMEN PELVIS W CONTRAST  Result Date: 06/21/2021 CLINICAL DATA:  Abdominal pain, acute, nonlocalized EXAM: CT ABDOMEN AND PELVIS WITH CONTRAST TECHNIQUE: Multidetector CT imaging of the abdomen and pelvis was performed using the standard protocol following bolus administration of intravenous contrast. CONTRAST:  153mL OMNIPAQUE IOHEXOL 300 MG/ML  SOLN COMPARISON:  CT abdomen pelvis 09/13/2015 FINDINGS: Lower chest: No acute abnormality.  Tiny hiatal hernia. Hepatobiliary: No focal liver abnormality. No gallstones, gallbladder wall thickening, or pericholecystic fluid. No biliary dilatation. Pancreas: No focal lesion. Normal pancreatic contour. No surrounding inflammatory changes. No main pancreatic ductal dilatation. Spleen: Normal in size without focal abnormality. Adrenals/Urinary Tract: No adrenal nodule bilaterally. Bilateral kidneys enhance symmetrically. No hydronephrosis. No hydroureter. The urinary bladder is decompressed and grossly unremarkable. Stomach/Bowel: Stomach is within normal limits. No evidence of bowel wall thickening or dilatation. Appendix appears normal. Vascular/Lymphatic: No abdominal aorta or  iliac aneurysm. No abdominal, pelvic, or inguinal lymphadenopathy. Reproductive: The uterus is retroflexed. Otherwise uterus and bilateral adnexa are unremarkable. Other: No intraperitoneal free fluid. No intraperitoneal free gas. No organized fluid collection. Musculoskeletal: No acute or significant osseous findings. IMPRESSION: 1. Tiny hiatal hernia. 2. No acute intra-abdominal or intrapelvic abnormality. Electronically Signed   By: Iven Finn M.D.   On: 06/21/2021 15:37    Procedures Procedures   Medications Ordered in ED Medications  sodium chloride 0.9 % bolus 1,000 mL (has no administration in time range)  ondansetron (ZOFRAN) injection 4 mg (has no administration in time range)  pantoprazole (PROTONIX) injection 40 mg (has no administration in time range)    ED Course  I have reviewed the triage vital signs and the nursing notes.  Pertinent labs & imaging results that were available during my care of the patient were reviewed by me and considered in my medical decision making (see chart for details).    MDM Rules/Calculators/A&P                         Patient here with complaints of diffuse abdominal pain, diarrhea, and nausea.  Sudden onset at 6 AM this morning waking her from sleep.  Denies any new food or medication.  No recent sick contacts.  On exam, patient tearful and writhing around on the stretcher.  She has diffuse tenderness to the entire abdomen without guarding or peritoneal signs.  Source of patient's symptoms are unclear at this time I suspect viral process, although acute appendicitis, ileus, colitis also considered.  Will obtain labs, UA and she may also need CT imaging of her abdomen and pelvis.  Will reassess.  On recheck, patient reports feeling somewhat better.  She has tolerated oral fluid challenge.  Labs interpreted by me, no leukocytosis.  Chemistries, urine in lipase unremarkable.  Viral testing negative.  Patient is now feeling better and tolerating  fluids, I suspect her symptoms are viral.  She appears appropriate for discharge home.  Will treat symptomatically with antiemetic and short prescription for Protonix.  She has been hypertensive during her ER stay.  No history of hypertension, but states that "my blood pressure is always high when I am sick or stressed" on review of previous medical records patient was noted to be mildly hypertensive in August of this year.  She does have a primary care follow-up.  I have discussed the importance of outpatient follow-up regarding her blood pressure there is no evidence of endorgan damage at this time.  She verbalizes understanding and agrees to arrange follow-up.  Return precautions were discussed.      Final Clinical Impression(s) / ED Diagnoses Final diagnoses:  Gastroenteritis  Hypertension, unspecified type    Rx / DC Orders ED Discharge Orders     None        Bufford Lope 06/21/21 1725    Carmin Muskrat, MD 06/22/21 1311

## 2021-06-21 NOTE — Discharge Instructions (Signed)
Your test today were reassuring.  You likely have a viral process.  Recommend frequent, small sips of fluids this evening then bland diet as tolerated starting tomorrow.  No greasy, spicy or fatty foods.  Avoid alcohol.  Your blood pressure today was elevated.  You will need to have this rechecked soon.  Your primary care provider can check this for you.   Return emergency department for any new or worsening symptoms.

## 2021-06-21 NOTE — ED Triage Notes (Signed)
Woke up this morning with stomach pain, feeling nauseous.  Skin warm and dry pt tearful in triage.  Denies vomiting at this time.

## 2021-11-05 ENCOUNTER — Ambulatory Visit
Admission: EM | Admit: 2021-11-05 | Discharge: 2021-11-05 | Disposition: A | Discharge status: Home / Self Care | Payer: BC Managed Care – PPO | Attending: Student | Admitting: Student

## 2021-11-05 DIAGNOSIS — Z973 Presence of spectacles and contact lenses: Secondary | ICD-10-CM

## 2021-11-05 DIAGNOSIS — H1032 Unspecified acute conjunctivitis, left eye: Secondary | ICD-10-CM | POA: Diagnosis not present

## 2021-11-05 MED ORDER — CIPROFLOXACIN HCL 0.3 % OP SOLN
1.0000 [drp] | OPHTHALMIC | 0 refills | Status: AC
Start: 2021-11-05 — End: 2021-11-12

## 2021-11-05 NOTE — ED Provider Notes (Signed)
?[place] ? ? ? ?CSN: [id number] ?Arrival date & time: 11/05/21  1252 ? ? ?  ? ?History   ?Chief Complaint ?Chief Complaint  ?Patient presents with  ? Eye Problem  ? ? ?HPI ?Denise Khan is a 36 y.o. female presenting with left eye watering and crusting with irritation following exposure to pinkeye.  History noncontributory.  Wears contact lenses.  Patient states that she was exposed to pinkeye multiple times where she works, following this developed itching, redness, watering, crusting in the morning of the left eye.  She did attempt to wear contacts a few days ago, and found the irritated her eyes so has not worn contacts since.  Denies foreign body sensation.  Denies photophobia, foreign body sensation, eye pain, eye pain with movement, injury to eye, vision changes, double vision. ? ?HPI ? ?Past Medical History:  ?Diagnosis Date  ? Anxiety   ? Blood transfusion without reported diagnosis   ? Breech presentation 06/02/2020  ? Cancer [facility])   ? Cesarean delivery delivered 06/02/2020  ? Clotting disorder ([place])   ? PE (pulmonary embolism)   ? ? ?Patient Active Problem List  ? Diagnosis Date Noted  ? Encounter for general adult medical examination with abnormal findings 02/08/2021  ? Anxiety 02/08/2021  ? Gestational diabetes mellitus, class A2 03/23/2020  ? Uterine fibroid 02/06/2017  ? Environmental allergies 02/06/2017  ? 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 History of pulmonary embolus (PE) 01/05/2013  ? ? ?Past Surgical History:  ?Procedure Laterality Date  ? CESAREAN SECTION  06/02/2020  ? Procedure: CESAREAN SECTION;  Surgeon: Donnamae Jude, MD;  Location: [facility] LD ORS;  Service: Obstetrics;;  ? KNEE SURGERY    ? PORT-A-CATH REMOVAL    ? ? ?OB History   ? ? Gravida  ?2  ? Para  ?2  ? Term  ?1  ? Preterm  ?1  ? AB  ?   ? Living  ?2  ?  ? ? SAB  ?   ? IAB  ?   ? Ectopic  ?   ? Multiple  ?0  ? Live Births  ?2  ?   ?  ?  ? ? ? ?Home Medications   ? ?Prior to Admission medications    ?Medication Sig Start Date End Date Taking? Authorizing Provider  ?ciprofloxacin (CILOXAN) 0.3 % ophthalmic solution Place 1 drop into the left eye every 4 (four) hours while awake for 7 days. 11/05/21 11/12/21 Yes Hazel Sams, PA-C  ?pantoprazole (PROTONIX) 20 MG tablet Take 1 tablet (20 mg total) by mouth daily. 06/21/21   Triplett, Tammy, PA-C  ?rivaroxaban (XARELTO) 20 MG TABS tablet Take 20 mg by mouth daily with supper.    [provider]  ? ? ?Family History ?Family History  ?Problem Relation Age of Onset  ? CAD Father   ? Hypertension Father   ? Heart disease Father   ? Hypertension Mother   ? Cancer Maternal Grandmother   ?     lung  ? Stroke Maternal Grandfather   ? Hypertension Maternal Grandfather   ? Kidney disease Maternal Grandfather   ? Hernia Son   ? ? ?Social History ?Social History  ? ?Tobacco Use  ? Smoking status: Never  ? Smokeless tobacco: Never  ?Vaping Use  ? Vaping Use: Never used  ?Substance Use Topics  ? Alcohol use: Yes  ?  Comment: 1-2 drinks per month  ? Drug use: Not Currently  ? ? ? ?  Allergies   ?Patient has no known allergies. ? ? ?Review of Systems ?Review of Systems  ?Eyes:  Positive for discharge and redness.  ?All other systems reviewed and are negative. ? ? ?Physical Exam ?Triage Vital Signs ?ED Triage Vitals  ?Enc Vitals Group  ?   BP 11/05/21 1411 (!) 161/106  ?   Pulse Rate 11/05/21 1411 (!) 56  ?   Resp 11/05/21 1411 20  ?   Temp 11/05/21 1411 98.7 ?F (37.1 ?C)  ?   Temp Source 11/05/21 1411 Oral  ?   SpO2 11/05/21 1411 98 %  ?   Weight --   ?   Height --   ?   Head Circumference --   ?   Peak Flow --   ?   Pain Score 11/05/21 1409 0  ?   Pain Loc --   ?   Pain Edu? --   ?   Excl. in [place]? --   ? ?No data found. ? ?Updated Vital Signs ?BP (!) 161/106 (BP Location: Right Arm) Comment: Pt states that er body does not respond to stress well and it makes her BP high  Pulse (!) 56   Temp 98.7 ?F (37.1 ?C) (Oral)   Resp 20   LMP 11/01/2021 (Approximate)   SpO2 98%    Breastfeeding No  ? ?Visual Acuity ?Right Eye Distance:   ?Left Eye Distance:   ?Bilateral Distance:   ? ?Right Eye Near:   ?Left Eye Near:    ?Bilateral Near:    ? ?Physical Exam ?Vitals reviewed.  ?Constitutional:   ?   Appearance: Normal appearance.  ?HENT:  ?   Head: Normocephalic and atraumatic.  ?   Right Ear: Tympanic membrane, ear canal and external ear normal. There is no impacted cerumen.  ?   Left Ear: Tympanic membrane, ear canal and external ear normal. There is no impacted cerumen.  ?   Nose: Nose normal. No congestion.  ?   Mouth/Throat:  ?   Pharynx: Oropharynx is clear. No posterior oropharyngeal erythema.  ?Eyes:  ?   General: Lids are normal. Lids are everted, no foreign bodies appreciated. Vision grossly intact. Gaze aligned appropriately. No visual field deficit.    ?   Right eye: No foreign body, discharge or hordeolum.     ?   Left eye: No foreign body, discharge or hordeolum.  ?   Extraocular Movements: Extraocular movements intact.  ?   Right eye: Normal extraocular motion and no nystagmus.  ?   Left eye: Normal extraocular motion and no nystagmus.  ?   Conjunctiva/sclera: Conjunctivae normal.  ?   Right eye: Right conjunctiva is not injected. No chemosis, exudate or hemorrhage. ?   Left eye: Left conjunctiva is not injected. No chemosis, exudate or hemorrhage. ?   Pupils: Pupils are equal, round, and reactive to light.  ?   Visual Fields: Right eye visual fields normal and left eye visual fields normal.  ?   Comments: Minimal crusting upper lash line. No conjunctival injection or lid changes. PERRLA, EOMI without pain. No orbital tenderness. Visual acuity grossly intact.   ?Cardiovascular:  ?   Rate and Rhythm: Normal rate and regular rhythm.  ?   Heart sounds: Normal heart sounds.  ?Pulmonary:  ?   Effort: Pulmonary effort is normal.  ?   Breath sounds: Normal breath sounds.  ?Neurological:  ?   General: No focal deficit present.  ?   Mental Status: She is  alert.  ?Psychiatric:     ?    Mood and Affect: Mood normal.     ?   Behavior: Behavior normal.     ?   Thought Content: Thought content normal.     ?   Judgment: Judgment normal.  ? ? ? ?UC Treatments / Results  ?Labs ?(all labs ordered are listed, but only abnormal results are displayed) ?Labs Reviewed - No data to display ? ?EKG ? ? ?Radiology ?No results found. ? ?Procedures ?Procedures (including critical care time) ? ?Medications Ordered in UC ?Medications - No data to display ? ?Initial Impression / Assessment and Plan / UC Course  ?I have reviewed the triage vital signs and the nursing notes. ? ?Pertinent labs & imaging results that were available during my care of the patient were reviewed by me and considered in my medical decision making (see chart for details). ? ?  ? ?This patient is a very pleasant 36 y.o. year old female presenting with possible pinkeye following exposure to this. Visual acuity grossly intact. She does wear contact lenses. Ciprofloxacin drops sent. ED return precautions discussed. Patient verbalizes understanding and agreement.  ?.  ? ?Final Clinical Impressions(s) / UC Diagnoses  ? ?Final diagnoses:  ?Acute bacterial conjunctivitis of left eye  ?Wears contact lenses  ? ? ? ?Discharge Instructions   ? ?  ?-You have bacterial conjunctivitis (pinkeye). ?-Start the antibiotic drops, ciprofloxacin 1 drop into the affected eye/s every four hours while awake for 7 days. ?-You can also try warm compresses or washing the face with gentle soap and water to remove crust in the morning. ?-Your symptoms should be a lot better in about 1 day, and you'll also no longer be contagious after 1 day on the antibiotic. ?-If your symptoms are getting worse instead of better, like pain, discharge, itching, vision changes-follow-up with your eye doctor as soon as possible. ?-Do not wear contact lenses until you have completed treatment (7 days!). Wear glasses only. Throw out your old contact lenses.  ? ? ? ?ED Prescriptions   ? ?  Medication Sig Dispense Auth. Provider  ? ciprofloxacin (CILOXAN) 0.3 % ophthalmic solution Place 1 drop into the left eye every 4 (four) hours while awake for 7 days. 1.8 mL Hazel Sams, PA-C  ? ?  ? ?PDMP not revi

## 2021-11-05 NOTE — ED Triage Notes (Signed)
Pt states she started noticing that her left eye has been watery since last week ? 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 ?Pt states that this morning she woke up with her left eye stuck together and itching ? ? ?

## 2021-11-05 NOTE — Discharge Instructions (Addendum)
-  You have bacterial conjunctivitis (pinkeye). ?-Start the antibiotic drops, ciprofloxacin 1 drop into the affected eye/s every four hours while awake for 7 days. ?-You can also try warm compresses or washing the face with gentle soap and water to remove crust in the morning. ?-Your symptoms should be a lot better in about 1 day, and you'll also no longer be contagious after 1 day on the antibiotic. ?-If your symptoms are getting worse instead of better, like pain, discharge, itching, vision changes-follow-up with your eye doctor as soon as possible. ?-Do not wear contact lenses until you have completed treatment (7 days!). Wear glasses only. Throw out your old contact lenses.  ? ?

## 2021-12-07 ENCOUNTER — Encounter: Payer: Self-pay | Admitting: Adult Health

## 2021-12-07 ENCOUNTER — Other Ambulatory Visit (HOSPITAL_COMMUNITY)
Admission: RE | Admit: 2021-12-07 | Discharge: 2021-12-07 | Disposition: A | Discharge status: Home / Self Care | Payer: BC Managed Care – PPO | Source: Ambulatory Visit | Attending: Adult Health | Admitting: Adult Health

## 2021-12-07 ENCOUNTER — Ambulatory Visit (INDEPENDENT_AMBULATORY_CARE_PROVIDER_SITE_OTHER): Payer: BC Managed Care – PPO | Admitting: Adult Health

## 2021-12-07 VITALS — BP 151/95 | HR 67 | Ht 69.25 in | Wt 250.0 lb

## 2021-12-07 DIAGNOSIS — Z01419 Encounter for gynecological examination (general) (routine) without abnormal findings: Secondary | ICD-10-CM | POA: Insufficient documentation

## 2021-12-07 DIAGNOSIS — Z3202 Encounter for pregnancy test, result negative: Secondary | ICD-10-CM

## 2021-12-07 DIAGNOSIS — F419 Anxiety disorder, unspecified: Secondary | ICD-10-CM | POA: Diagnosis not present

## 2021-12-07 DIAGNOSIS — Z975 Presence of (intrauterine) contraceptive device: Secondary | ICD-10-CM | POA: Diagnosis not present

## 2021-12-07 DIAGNOSIS — Z86711 Personal history of pulmonary embolism: Secondary | ICD-10-CM

## 2021-12-07 LAB — POCT URINE PREGNANCY: Preg Test, Ur: NEGATIVE

## 2021-12-07 NOTE — Progress Notes (Signed)
Patient ID: Denise Khan, female   DOB: [birth year], 36 y.o.   MRN: [id number] History of Present Illness: Denise Khan is a 36 year old black female, with SO, G2P1102 in for a well woman gyn exam and pap. She has nexplanon and had heavy bleeding last month and none this month. Has small fibroids on [place] when pregnant. And has history of PE on xarelto. PCP is Dr Posey Pronto.    Current Medications, Allergies, Past Medical History, Past Surgical History, Family History and Social History were reviewed in [employer] record.     Review of Systems: Patient denies any headaches, hearing loss, fatigue, blurred vision, shortness of breath, chest pain, abdominal pain, problems with bowel movements, urination, or intercourse. No joint pain or mood swings.  See HPI for positives.    Physical Exam:BP (!) 151/95 (BP Location: Right Arm, Patient Position: Sitting, Cuff Size: Normal)   Pulse 67   Ht 5' 9.25" (1.759 m)   Wt 250 lb (113.4 kg)   LMP 11/03/2021   Breastfeeding No   BMI 36.65 kg/m  UPT is negative General:  Well developed, well nourished, no acute distress Skin:  Warm and dry Neck:  Midline trachea, normal thyroid, good ROM, no lymphadenopathy Lungs; Clear to auscultation bilaterally Breast:  No dominant palpable mass, retraction, or nipple discharge Cardiovascular: Regular rate and rhythm Abdomen:  Soft, non tender, no hepatosplenomegaly Pelvic:  External genitalia is normal in appearance, no lesions.  The vagina is normal in appearance. Urethra has no lesions or masses. The cervix is bulbous.Pap with GC/CHL and  HR HPV genotyping performed.  Uterus is felt to be normal size, shape, and contour.  No adnexal masses or tenderness noted.Bladder is non tender, no masses felt. Extremities/musculoskeletal:  No swelling or varicosities noted, no clubbing or cyanosis Psych:  No mood changes, alert and cooperative,seems happy AA is 1    12/07/2021    4:04 PM 03/15/2021    2:37 PM  02/08/2021    2:37 PM  Depression screen PHQ 2/9  Decreased Interest 0 0 0  Down, Depressed, Hopeless 0 0 0  PHQ - 2 Score 0 0 0  Altered sleeping 0    Tired, decreased energy 2    Change in appetite 0    Feeling bad or failure about yourself  0    Trouble concentrating 2    Moving slowly or fidgety/restless 0    Suicidal thoughts 0    PHQ-9 Score 4         12/07/2021    4:04 PM 03/15/2021    2:43 PM 02/08/2021    2:37 PM 12/14/2019   12:12 PM  GAD 7 : Generalized Anxiety Score  Nervous, Anxious, on Edge '2 2 3 '$ 0  Control/stop worrying '1 3 1 '$ 0  Worry too much - different things '1 1 1 '$ 0  Trouble relaxing '2 2 2 '$ 0  Restless 1 0 2 0  Easily annoyed or irritable '2 2 3 1  '$ Afraid - awful might happen 0 1 1 0  Total GAD 7 Score '9 11 13 1  '$ Anxiety Difficulty  Somewhat difficult Somewhat difficult Not difficult at all      Upstream - 12/07/21 1609       Pregnancy Intention Screening   Does the patient want to become pregnant in the next year? No    Does the patient's partner want to become pregnant in the next year? No    Would the patient like to discuss  contraceptive options today? No      Contraception Wrap Up   Current Method Hormonal Implant    End Method Hormonal Implant            Examination chaperoned by Celene Squibb LPN  Impression and Plan: 1. Encounter for gynecological examination with Papanicolaou smear of cervix Pap sent Physical in 1 year Pap in 3 if normal  Labs with PCP  2. Pregnancy examination or test, negative result  3. Nexplanon in place Placed 07/07/20  4. Anxiety She declines meds, has tried in past and they did not help  5. History of pulmonary embolus (PE) On xarelto Has follow up with PCP in August

## 2021-12-11 LAB — CYTOLOGY - PAP
Adequacy: ABSENT
Chlamydia: NEGATIVE
Comment: NEGATIVE
Comment: NEGATIVE
Comment: NORMAL
Diagnosis: NEGATIVE
High risk HPV: NEGATIVE
Neisseria Gonorrhea: NEGATIVE

## 2021-12-12 ENCOUNTER — Other Ambulatory Visit: Payer: Self-pay | Admitting: Adult Health

## 2021-12-12 MED ORDER — METRONIDAZOLE 500 MG PO TABS: 500.0000 mg | ORAL_TABLET | Freq: Two times a day (BID) | ORAL | 0 refills | Status: DC

## 2022-03-19 ENCOUNTER — Other Ambulatory Visit: Payer: Self-pay | Admitting: Adult Health

## 2022-03-19 MED ORDER — METRONIDAZOLE 500 MG PO TABS: 500.0000 mg | ORAL_TABLET | Freq: Two times a day (BID) | ORAL | 0 refills | Status: DC

## 2022-03-19 NOTE — Telephone Encounter (Signed)
Refilled flagyl 

## 2022-03-23 ENCOUNTER — Ambulatory Visit: Payer: BC Managed Care – PPO | Admitting: Family Medicine

## 2022-03-23 ENCOUNTER — Encounter: Payer: Self-pay | Admitting: Family Medicine

## 2022-03-23 ENCOUNTER — Other Ambulatory Visit: Payer: Self-pay | Admitting: Family Medicine

## 2022-03-23 VITALS — BP 142/90 | HR 79 | Ht 70.0 in | Wt 255.0 lb

## 2022-03-23 DIAGNOSIS — F419 Anxiety disorder, unspecified: Secondary | ICD-10-CM

## 2022-03-23 DIAGNOSIS — M25562 Pain in left knee: Secondary | ICD-10-CM

## 2022-03-23 DIAGNOSIS — G8929 Other chronic pain: Secondary | ICD-10-CM

## 2022-03-23 DIAGNOSIS — M25561 Pain in right knee: Secondary | ICD-10-CM

## 2022-03-23 DIAGNOSIS — Z0001 Encounter for general adult medical examination with abnormal findings: Secondary | ICD-10-CM | POA: Diagnosis not present

## 2022-03-23 DIAGNOSIS — F32A Depression, unspecified: Secondary | ICD-10-CM

## 2022-03-23 DIAGNOSIS — I10 Essential (primary) hypertension: Secondary | ICD-10-CM | POA: Diagnosis not present

## 2022-03-23 DIAGNOSIS — Z2821 Immunization not carried out because of patient refusal: Secondary | ICD-10-CM | POA: Diagnosis not present

## 2022-03-23 DIAGNOSIS — E559 Vitamin D deficiency, unspecified: Secondary | ICD-10-CM

## 2022-03-23 DIAGNOSIS — R7301 Impaired fasting glucose: Secondary | ICD-10-CM

## 2022-03-23 DIAGNOSIS — C9201 Acute myeloblastic leukemia, in remission: Secondary | ICD-10-CM

## 2022-03-23 MED ORDER — SERTRALINE HCL 25 MG PO TABS: 25.0000 mg | ORAL_TABLET | Freq: Every day | ORAL | 2 refills | Status: DC

## 2022-03-23 MED ORDER — TELMISARTAN 20 MG PO TABS: 20.0000 mg | ORAL_TABLET | Freq: Every day | ORAL | 1 refills | Status: DC

## 2022-03-23 NOTE — Patient Instructions (Addendum)
I appreciate the opportunity to provide care to you today!    Follow up: 1  months  Labs: please stop by the lab during the week to get your blood drawn (CBC, CMP, TSH, Lipid profile, HgA1c, Vit D)  Please pick up your medications at the pharmacy  Patellofemoral Pain Syndrome This condition may be treated at home with rest, ice, compression, and elevation (RICE) -Activities modifications NSAIDs, such as ibuprofen Physical therapy to stretch and strengthen your leg muscles. Shoe inserts (orthotics) to take stress off your knee. A knee brace or knee support. Adhesive tapes to the skin   Referrals today-  orthopedic surgery, psychiatry   Please continue to a heart-healthy diet and increase your physical activities. Try to exercise for 37mns at least three times a week.      It was a pleasure to see you and I look forward to continuing to work together on your health and well-being. Please do not hesitate to call the office if you need care or have questions about your care.   Have a wonderful day and week. With Gratitude, GAlvira MondayMSN, FNP-BC

## 2022-03-23 NOTE — Progress Notes (Unsigned)
Complete physical exam  Patient: Denise Khan   DOB: 1985/09/16   36 y.o. Female  MRN: [id number]  Subjective:    Chief Complaint  Patient presents with   Annual Exam    Pt would like a cpe, has concerns about bp and also has anxiety flare ups.    Knee Pain    Pt c/o knee pain that has been going on for a while had surgery back in 2006 on left knee.     Denise Khan is a 36 y.o. female who presents today for a complete physical exam. She reports consuming a general diet. The patient does not participate in regular exercise at present. 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  She generally feels well. She reports sleeping well. She does have additional problems to discuss today.   Hypertension: uncontrolled. She denies dizziness, chest pain, palpitations, and blurred vision.   Anxiety: GAD-7 is 15. She reports that her anxiety has worsened. She reports increased worry and agitation, noting always being on edge and fidgety.    Chronic bilateral knee pain: hx of acute myeloblastic leukemia in 2017 and follow-up every six months with oncology. She c/o anterior knee pain that worsens with physical activities such as running, squatting, jumping, and ascending and descending steps. 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 Pain is rated 8/10 and described as achy, noting that her symptoms have worsened with age. She reports using a knee brace and taking BC powder, Aleve, and Tylenol with minimum relief of symptoms. She denies fever, unintentional weight loss, malaise, knee effusion, tenderness and erythema and recent trauma or injury to the knees.    Most recent fall risk assessment:    03/23/2022    3:46 PM  [place] in the past year? 0  Number falls in past yr: 0  Injury with Fall? 0  Risk for fall due to : No Fall Risks  Follow up Falls evaluation completed     Most recent  depression screenings:    03/23/2022    4:36 PM 03/23/2022    3:46 PM  PHQ 2/9 Scores  PHQ - 2 Score 4 0  PHQ- 9 Score 11     Vision:Within last year Aug 2022  Last dental appointment was may 2023 Patient Active Problem List   Diagnosis Date Noted   Chronic pain of both knees 03/25/2022   Primary hypertension 03/25/2022   Nexplanon in place 12/07/2021   Pregnancy examination or test, negative result 12/07/2021   Encounter for gynecological examination with Papanicolaou smear of cervix 12/07/2021   Encounter for general adult medical examination with abnormal findings 02/08/2021   Anxiety and depression 02/08/2021   Gestational diabetes mellitus, class A2 03/23/2020   Uterine fibroid 02/06/2017   Environmental allergies 02/06/2017   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[place]) 09/16/2015   History of pulmonary embolus (PE) 01/05/2013   Past Medical History:  Diagnosis Date   Anxiety    Blood transfusion without reported diagnosis    Breech presentation 06/02/2020   Cancer [facility])    Cesarean delivery delivered 06/02/2020   Clotting disorder ([place])    PE (pulmonary embolism)    Past Surgical History:  Procedure Laterality Date   CESAREAN SECTION  06/02/2020   Procedure: CESAREAN SECTION;  Surgeon: Donnamae Jude, MD;  Location: [facility];  Service: Obstetrics;;   KNEE SURGERY  PORT-A-CATH REMOVAL     Social History   Tobacco Use   Smoking status: Never   Smokeless tobacco: Never  Vaping Use   Vaping Use: Never used  Substance Use Topics   Alcohol use: Yes    Comment: 1-2 drinks per month   Drug use: Not Currently   Social History   Socioeconomic History   Marital status: Significant Other    Spouse name: Not on file   Number of children: 2   Years of education: 16   Highest education level: Not on file  Occupational History   Occupation: biology teacher    Comment: [employer]  Tobacco Use   Smoking status: Never   Smokeless tobacco: Never  Vaping  Use   Vaping Use: Never used  Substance and Sexual Activity   Alcohol use: Yes    Comment: 1-2 drinks per month   Drug use: Not Currently   Sexual activity: Yes    Birth control/protection: Implant    Comment: Nexplanon  Other Topics Concern   Not on file  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 with 40 yo son Trefry; has infant son as well   His dad is Jeneen Rinks, lives with them   Social Determinants of Health   Financial Resource Strain: Low Risk  (12/07/2021)   Overall Financial Resource Strain (CARDIA)    Difficulty of Paying Living Expenses: Not very hard  Food Insecurity: No Food Insecurity (12/07/2021)   Hunger Vital Sign    Worried About Running Out of Food in the Last Year: Never true    Ran Out of Food in the Last Year: Never true  Transportation Needs: No Transportation Needs (12/07/2021)   PRAPARE - Hydrologist (Medical): No    Lack of Transportation (Non-Medical): No  Physical Activity: Inactive (12/07/2021)   Exercise Vital Sign    Days of Exercise per Week: 0 days    Minutes of Exercise per Session: 0 min  Stress: No Stress Concern Present (12/07/2021)   [place]    Feeling of Stress : Only a little  Social Connections: Moderately Isolated (12/07/2021)   Social Connection and Isolation Panel [NHANES]    Frequency of Communication with Friends and Family: More than three times a week    Frequency of Social Gatherings with Friends and Family: Once a week    Attends Religious Services: Never    Marine scientist or Organizations: No    Attends Archivist Meetings: Never    Marital Status: Living with partner  Intimate Partner Violence: Not At Risk (12/07/2021)   Humiliation, Afraid, Rape, and Kick questionnaire    Fear of Current or Ex-Partner: No    Emotionally Abused: No    Physically Abused: No    Sexually Abused: No   Family Status  Relation Name  Status   Father  Alive   Mother  Alive   Brother darius Alive   Son tavian 6 Alive   Brother devon Alive   Brother julian Alive   MGM  Deceased   MGF  Deceased   PGM  Alive   PGF  Deceased   Mat Aunt  Alive   Mat Uncle  Alive   Pat Aunt  Alive   Pat Uncle  Alive   Son drake Alive   Family History  Problem Relation Age of Onset   CAD Father    Hypertension  Father    Heart disease Father    Hypertension Mother    Cancer Maternal Grandmother        lung   Stroke Maternal Grandfather    Hypertension Maternal Grandfather    Kidney disease Maternal Grandfather    Hernia Son    No Known Allergies    Patient Care Team: Alvira Monday, FNP as PCP - General (Family Medicine)   Outpatient Medications Prior to Visit  Medication Sig   etonogestrel (NEXPLANON) 68 MG IMPL implant 1 each by Subdermal route once.   rivaroxaban (XARELTO) 20 MG TABS tablet Take 20 mg by mouth daily with supper.   [DISCONTINUED] metroNIDAZOLE (FLAGYL) 500 MG tablet Take 1 tablet (500 mg total) by mouth 2 (two) times daily.   No facility-administered medications prior to visit.    Review of Systems  Constitutional:  Negative for chills and fever.  HENT:  Negative for congestion and sinus pain.   Eyes:  Negative for discharge and redness.  Respiratory:  Negative for cough.   Cardiovascular:  Negative for chest pain, palpitations and leg swelling.  Gastrointestinal:  Negative for abdominal pain, diarrhea and vomiting.  Genitourinary:  Negative for frequency and urgency.  Musculoskeletal:  Negative for back pain and neck pain.       Knee pain  Skin:  Negative for rash.  Neurological:  Positive for headaches. Negative for dizziness.  Endo/Heme/Allergies:  Negative for polydipsia.  Psychiatric/Behavioral:  Negative for memory loss. The patient does not have insomnia.           Objective:     BP (!) 142/90 (BP Location: Left Arm)   Pulse 79   Ht '5\' 10"'  (1.778 m)   Wt 255 lb 0.3 oz (115.7  kg)   SpO2 98%   BMI 36.59 kg/m  BP Readings from Last 3 Encounters:  03/23/22 (!) 142/90  12/07/21 (!) 151/95  11/05/21 (!) 161/106   Wt Readings from Last 3 Encounters:  03/23/22 255 lb 0.3 oz (115.7 kg)  12/07/21 250 lb (113.4 kg)  06/21/21 250 lb (113.4 kg)      Physical Exam Constitutional:      Appearance: She is obese.  HENT:     Head: Normocephalic.     Right Ear: External ear normal.     Left Ear: External ear normal.     Nose: No congestion.     Mouth/Throat:     Mouth: Mucous membranes are moist.  Eyes:     Extraocular Movements: Extraocular movements intact.     Pupils: Pupils are equal, round, and reactive to light.  Cardiovascular:     Rate and Rhythm: Normal rate and regular rhythm.     Pulses: Normal pulses.     Heart sounds: Normal heart sounds.  Pulmonary:     Effort: Pulmonary effort is normal.     Breath sounds: Normal breath sounds.  Abdominal:     Palpations: Abdomen is soft.  Musculoskeletal:        General: No swelling.     Cervical back: No rigidity.     Right lower leg: No edema.     Left lower leg: No edema.     Comments: Pain and crepitus noted with squatting. No tenderness noted with palpation of the quadriceps or patellar tendons noted. No warmth or effusion noted.  Skin:    Findings: No lesion or rash.  Neurological:     Mental Status: She is alert and oriented to person, place, and time.  No results found for any visits on 03/23/22. Last CBC Lab Results  Component Value Date   WBC 9.5 06/21/2021   HGB 15.5 (H) 06/21/2021   HCT 47.3 (H) 06/21/2021   MCV 99.2 06/21/2021   MCH 32.5 06/21/2021   RDW 12.2 06/21/2021   PLT 258 25/42/7062   Last metabolic panel Lab Results  Component Value Date   GLUCOSE 115 (H) 06/21/2021   NA 138 06/21/2021   K 3.9 06/21/2021   CL 111 06/21/2021   CO2 13 (L) 06/21/2021   BUN 14 06/21/2021   CREATININE 0.80 06/21/2021   GFRNONAA >60 06/21/2021   CALCIUM 9.2 06/21/2021   PROT 8.1  06/21/2021   ALBUMIN 4.4 06/21/2021   LABGLOB 2.1 03/13/2021   AGRATIO 2.1 03/13/2021   BILITOT 0.4 06/21/2021   ALKPHOS 62 06/21/2021   AST 21 06/21/2021   ALT 21 06/21/2021   ANIONGAP 14 06/21/2021   Last lipids Lab Results  Component Value Date   CHOL 143 03/13/2021   HDL 45 03/13/2021   LDLCALC 88 03/13/2021   TRIG 44 03/13/2021   Last hemoglobin A1c Lab Results  Component Value Date   HGBA1C 5.7 (H) 03/13/2021   Last thyroid functions Lab Results  Component Value Date   TSH 0.797 03/13/2021   Last vitamin D No results found for: "25OHVITD2", "25OHVITD3", "VD25OH" Last vitamin B12 and Folate Lab Results  Component Value Date   VITAMINB12 711 09/16/2015   FOLATE 11.9 09/16/2015        Assessment & Plan:    Routine Health Maintenance and Physical Exam  Immunization History  Administered Date(s) Administered   PFIZER Comirnaty(Gray Top)Covid-19 Tri-Sucrose Vaccine 09/06/2019, 09/27/2019   PFIZER(Purple Top)SARS-COV-2 Vaccination 09/06/2019, 09/27/2019   Tdap 03/22/2020    Health Maintenance  Topic Date Due   COVID-19 Vaccine (5 - Pfizer risk series) 11/22/2019   PAP SMEAR-Modifier  12/07/2024   TETANUS/TDAP  03/22/2030   Hepatitis C Screening  Completed   HIV Screening  Completed   HPV VACCINES  Aged Out   INFLUENZA VACCINE  Discontinued    Discussed health benefits of physical activity, and encouraged her to engage in regular exercise appropriate for her age and condition.  Problem List Items Addressed This Visit       Cardiovascular and Mediastinum   Primary hypertension    BP Readings from Last 3 Encounters:  03/23/22 (!) 142/90  12/07/21 (!) 151/95  11/05/21 (!) 161/106  Uncontrolled She denies dizziness, chest pain, palpitations, and blurred vision  Will start the patient on telmisartan 20 mg daily Dash diet reviewed Encouraged low sodium diet  Recommended limiting sodium intake to less than 1500 mg daily F/u in 1 month       Relevant Medications   telmisartan (MICARDIS) 20 MG tablet     Other   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[place])    Reports following up with oncology every 6 months Wt Readings from Last 3 Encounters:  03/23/22 255 lb 0.3 oz (115.7 kg)  12/07/21 250 lb (113.4 kg)  06/21/21 250 lb (113.4 kg)        Encounter for general adult medical examination with abnormal findings - Primary    Physical exam as documented Counseling is done on healthy lifestyle involving commitment to 150 minutes of exercise per week,  Discussed heart-healthy diet and attaining a healthy weight Changes in health habits are decided on by the patient with goals and time frames set for achieving them. Immunization and cancer screening needs are  specifically addressed at this visit  She received her dose of shingles vaccine and declined the flu vaccine          Anxiety and depression    GAD-7 is 15 PHQ-9 is 11 She reports that her anxiety has worsened She reports increased worry and agitation, noting always being on edge and fidgety  Will start the patient on Zoloft 23 mg daily Referral placed to psychiatry      Relevant Medications   sertraline (ZOLOFT) 25 MG tablet   Other Relevant Orders   Ambulatory referral to Psychiatry   Chronic pain of both knees    Finding likely of Patellofemoral pain Advised activities modifications and use of knee brace Referral placed to orthopedics surgery for further evaluation      Relevant Medications   sertraline (ZOLOFT) 25 MG tablet   Other Relevant Orders   Ambulatory referral to Orthopedic Surgery   Other Visit Diagnoses     Refused influenza vaccine       Vitamin D deficiency       Relevant Orders   Vitamin D (25 hydroxy)   IFG (impaired fasting glucose)       Relevant Orders   CBC with Differential/Platelet   CMP14+EGFR   TSH + free T4   Hemoglobin A1C   Lipid Profile      Return in about 1 month (around 04/22/2022).     Alvira Monday,  FNP

## 2022-03-25 DIAGNOSIS — G8929 Other chronic pain: Secondary | ICD-10-CM | POA: Insufficient documentation

## 2022-03-25 DIAGNOSIS — I10 Essential (primary) hypertension: Secondary | ICD-10-CM | POA: Insufficient documentation

## 2022-03-25 NOTE — Assessment & Plan Note (Addendum)
GAD-7 is 15 PHQ-9 is 11 She reports that her anxiety has worsened She reports increased worry and agitation, noting always being on edge and fidgety  Will start the patient on Zoloft 23 mg daily Referral placed to psychiatry

## 2022-03-25 NOTE — Assessment & Plan Note (Signed)
Physical exam as documented Counseling is done on healthy lifestyle involving commitment to 150 minutes of exercise per week,  Discussed heart-healthy diet and attaining a healthy weight Changes in health habits are decided on by the patient with goals and time frames set for achieving them. Immunization and cancer screening needs are specifically addressed at this visit  She received her dose of shingles vaccine and declined the flu vaccine

## 2022-03-25 NOTE — Assessment & Plan Note (Signed)
Reports following up with oncology every 6 months Wt Readings from Last 3 Encounters:  03/23/22 255 lb 0.3 oz (115.7 kg)  12/07/21 250 lb (113.4 kg)  06/21/21 250 lb (113.4 kg)

## 2022-03-25 NOTE — Assessment & Plan Note (Signed)
BP Readings from Last 3 Encounters:  03/23/22 (!) 142/90  12/07/21 (!) 151/95  11/05/21 (!) 161/106   Uncontrolled She denies dizziness, chest pain, palpitations, and blurred vision  Will start the patient on telmisartan 20 mg daily Dash diet reviewed Encouraged low sodium diet  Recommended limiting sodium intake to less than 1500 mg daily F/u in 1 month

## 2022-03-25 NOTE — Assessment & Plan Note (Signed)
Finding likely of Patellofemoral pain Advised activities modifications and use of knee brace Referral placed to orthopedics surgery for further evaluation

## 2022-04-09 ENCOUNTER — Ambulatory Visit: Payer: BC Managed Care – PPO | Admitting: Orthopedic Surgery

## 2022-04-19 ENCOUNTER — Encounter: Payer: Self-pay | Admitting: Orthopedic Surgery

## 2022-04-19 ENCOUNTER — Ambulatory Visit: Payer: BC Managed Care – PPO | Admitting: Orthopedic Surgery

## 2022-04-19 ENCOUNTER — Ambulatory Visit (INDEPENDENT_AMBULATORY_CARE_PROVIDER_SITE_OTHER): Payer: BC Managed Care – PPO

## 2022-04-19 VITALS — BP 144/98 | HR 71 | Ht 70.0 in | Wt 250.0 lb

## 2022-04-19 DIAGNOSIS — M25561 Pain in right knee: Secondary | ICD-10-CM

## 2022-04-19 DIAGNOSIS — G8929 Other chronic pain: Secondary | ICD-10-CM | POA: Diagnosis not present

## 2022-04-19 DIAGNOSIS — M25562 Pain in left knee: Secondary | ICD-10-CM | POA: Diagnosis not present

## 2022-04-19 DIAGNOSIS — M1712 Unilateral primary osteoarthritis, left knee: Secondary | ICD-10-CM | POA: Diagnosis not present

## 2022-04-19 MED ORDER — METHYLPREDNISOLONE ACETATE 40 MG/ML IJ SUSP
40.0000 mg | Freq: Once | INTRAMUSCULAR | Status: AC
  Administered 2022-04-19: 40 mg via INTRA_ARTICULAR

## 2022-04-19 MED ORDER — DICLOFENAC SODIUM 1 % EX GEL: 4.0000 g | Freq: Four times a day (QID) | CUTANEOUS | 3 refills | Status: DC

## 2022-04-19 NOTE — Progress Notes (Signed)
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 presents for evaluation of bilateral knee pain left greater than right with crepitance on range of motion difficulty squatting kneeling bending and maneuvering while coaching  No prior treatment  Patient had a PE and is therefore on Xarelto    ROS: History of pulmonary embolus currently on Xarelto history of AML  No Known Allergies  Current Outpatient Medications  Medication Instructions   diclofenac Sodium (VOLTAREN) 4 g, Topical, 4 times daily   etonogestrel (NEXPLANON) 68 MG IMPL implant 1 each, Subdermal,  Once   rivaroxaban (XARELTO) 20 mg, Oral, Daily with supper   sertraline (ZOLOFT) 25 mg, Oral, Daily   telmisartan (MICARDIS) 20 MG tablet TAKE 1 TABLET(20 MG) BY MOUTH DAILY    ROS only lists joint pain   BP (!) 144/98   Pulse 71   Ht '5\' 10"'$  (1.778 m)   Wt 250 lb (113.4 kg)   LMP 04/16/2022   BMI 35.87 kg/m   Body mass index is 35.87 kg/m.  General appearance: Well-developed well-nourished no gross deformities  Cardiovascular normal pulse and perfusion normal color without edema  Neurologically no sensation loss or deficits or pathologic reflexes  Psychological: Awake alert and oriented x3 mood and affect normal  Skin no lacerations or ulcerations no nodularity no palpable masses, no erythema or nodularity  Musculoskeletal:   No abnormalities on gait.  Right knee and left knee exam  Patient has hyperextension of both knees chronic  Full range of motion of both left and right knee crepitance on range of motion and squatting with pain  collateral ligaments stable cruciate ligaments stable  Tenderness along the peripatellar regions and medial joint lines.  Muscle tone is  normal     Past Medical History:  Diagnosis Date   Anxiety    Blood transfusion without reported diagnosis    Breech presentation 06/02/2020   Cancer [facility])    Cesarean delivery delivered 06/02/2020   Clotting disorder ([place])    PE (pulmonary embolism)     Past Surgical History:  Procedure Laterality Date   CESAREAN SECTION  06/02/2020   Procedure: CESAREAN SECTION;  Surgeon: Donnamae Jude, MD;  Location: [facility] LD [facility]S;  Service: Obstetrics;;   KNEE SURGERY     PORT-A-CATH REMOVAL      Family History  Problem Relation Age of Onset   CAD Father    Hypertension Father    Heart disease Father    Hypertension Mother    Cancer Maternal Grandmother        lung   Stroke Maternal Grandfather    Hypertension Maternal Grandfather    Kidney disease Maternal Grandfather    Hernia Son    Social History   Tobacco Use   Smoking status: Never   Smokeless tobacco: Never  Vaping Use   Vaping Use: Never used  Substance Use Topics   Alcohol use: Yes    Comment: 1-2 drinks per month   Drug use: Not Currently    No Known Allergies  Current Meds  Medication Sig   diclofenac Sodium (VOLTAREN) 1 % GEL Apply 4 g topically 4 (four) times daily.   etonogestrel (NEXPLANON) 68 MG IMPL implant 1 each by Subdermal route once.  rivaroxaban (XARELTO) 20 MG TABS tablet Take 20 mg by mouth daily with supper.   sertraline (ZOLOFT) 25 MG tablet Take 1 tablet (25 mg total) by mouth daily.   telmisartan (MICARDIS) 20 MG tablet TAKE 1 TABLET(20 MG) BY MOUTH DAILY     MEDICAL DECISION MAKING  A.  Encounter Diagnoses  Name Primary?   Chronic pain of left knee Yes   Chronic pain of right knee    Unilateral primary osteoarthritis, left knee     B. DATA ANALYSED:   IMAGING: Interpretation of images: I have personally reviewed the images and my interpretation is x-rays show no major signs of arthritic change Orders:  PT  Outside records reviewed:  None   C. MANAGEMENT   36 year old  female history of PE currently on Xarelto therefore cannot take any anti-inflammatory medications.  For pain she will be restricted to Tylenol and Voltaren gel.  She will need to lose approximately 25 pounds and undergo physical therapy to improve her quadricep strength and overall knee function  She is a candidate for selective injections of cortisone followed by hyaluronic acid  Procedure note left knee injection   verbal consent was obtained to inject left knee joint  Timeout was completed to confirm the site of injection  The medications used were depomedrol 40 mg and 1% lidocaine 3 cc Anesthesia was provided by ethyl chloride and the skin was prepped with alcohol.  After cleaning the skin with alcohol a 20-gauge needle was used to inject the left knee joint. There were no complications. A sterile bandage was applied.    Meds ordered this encounter  Medications   diclofenac Sodium (VOLTAREN) 1 % GEL    Sig: Apply 4 g topically 4 (four) times daily.    Dispense:  4 g    Refill:  3   methylPREDNISolone acetate (DEPO-MEDROL) injection 40 mg     Arther Abbott, MD  04/19/2022 3:58 PM

## 2022-04-19 NOTE — Patient Instructions (Signed)
For pain use tylenol 500 mg every 6 hrs   Try to lose 20 lbs   Physical therapy is also advised to improve range of motion, knee pain, and knee function   You are a candidate for cortisone injections and hyaluronic acid   You can not take antiinflammatory medications due to the xarelto

## 2022-04-21 LAB — CMP14+EGFR
ALT: 17 IU/L (ref 0–32)
AST: 17 IU/L (ref 0–40)
Albumin/Globulin Ratio: 2 (ref 1.2–2.2)
Albumin: 4.4 g/dL (ref 3.9–4.9)
Alkaline Phosphatase: 52 IU/L (ref 44–121)
BUN/Creatinine Ratio: 11 (ref 9–23)
BUN: 10 mg/dL (ref 6–20)
Bilirubin Total: 0.6 mg/dL (ref 0.0–1.2)
CO2: 21 mmol/L (ref 20–29)
Calcium: 9.2 mg/dL (ref 8.7–10.2)
Chloride: 108 mmol/L — ABNORMAL HIGH (ref 96–106)
Creatinine, Ser: 0.9 mg/dL (ref 0.57–1.00)
Globulin, Total: 2.2 g/dL (ref 1.5–4.5)
Glucose: 101 mg/dL — ABNORMAL HIGH (ref 70–99)
Potassium: 4.5 mmol/L (ref 3.5–5.2)
Sodium: 142 mmol/L (ref 134–144)
Total Protein: 6.6 g/dL (ref 6.0–8.5)
eGFR: 84 mL/min/{1.73_m2} (ref >59)

## 2022-04-21 LAB — CBC WITH DIFFERENTIAL/PLATELET
Basophils Absolute: 0 10*3/uL (ref 0.0–0.2)
Basos: 1 %
EOS (ABSOLUTE): 0.1 10*3/uL (ref 0.0–0.4)
Eos: 2 %
Hematocrit: 39.7 % (ref 34.0–46.6)
Hemoglobin: 13.4 g/dL (ref 11.1–15.9)
Immature Grans (Abs): 0 10*3/uL (ref 0.0–0.1)
Immature Granulocytes: 0 %
Lymphocytes Absolute: 1.4 10*3/uL (ref 0.7–3.1)
Lymphs: 32 %
MCH: 32.9 pg (ref 26.6–33.0)
MCHC: 33.8 g/dL (ref 31.5–35.7)
MCV: 98 fL — ABNORMAL HIGH (ref 79–97)
Monocytes Absolute: 0.3 10*3/uL (ref 0.1–0.9)
Monocytes: 6 %
Neutrophils Absolute: 2.4 10*3/uL (ref 1.4–7.0)
Neutrophils: 59 %
Platelets: 281 10*3/uL (ref 150–450)
RBC: 4.07 x10E6/uL (ref 3.77–5.28)
RDW: 11.8 % (ref 11.7–15.4)
WBC: 4.2 10*3/uL (ref 3.4–10.8)

## 2022-04-21 LAB — LIPID PANEL
Chol/HDL Ratio: 3 ratio (ref 0.0–4.4)
Cholesterol, Total: 136 mg/dL (ref 100–199)
HDL: 46 mg/dL (ref >39)
LDL Chol Calc (NIH): 81 mg/dL (ref 0–99)
Triglycerides: 38 mg/dL (ref 0–149)
VLDL Cholesterol Cal: 9 mg/dL (ref 5–40)

## 2022-04-21 LAB — TSH+FREE T4
Free T4: 1.55 ng/dL (ref 0.82–1.77)
TSH: 1.33 u[IU]/mL (ref 0.450–4.500)

## 2022-04-21 LAB — HEMOGLOBIN A1C
Est. average glucose Bld gHb Est-mCnc: 108 mg/dL
Hgb A1c MFr Bld: 5.4 % (ref 4.8–5.6)

## 2022-04-21 LAB — VITAMIN D 25 HYDROXY (VIT D DEFICIENCY, FRACTURES): Vit D, 25-Hydroxy: 13.4 ng/mL — ABNORMAL LOW (ref 30.0–100.0)

## 2022-04-23 ENCOUNTER — Encounter: Payer: Self-pay | Admitting: Family Medicine

## 2022-04-23 ENCOUNTER — Ambulatory Visit: Payer: BC Managed Care – PPO | Admitting: Family Medicine

## 2022-04-23 ENCOUNTER — Other Ambulatory Visit: Payer: Self-pay | Admitting: Family Medicine

## 2022-04-23 VITALS — BP 154/92 | Ht 70.0 in | Wt 256.1 lb

## 2022-04-23 DIAGNOSIS — E559 Vitamin D deficiency, unspecified: Secondary | ICD-10-CM

## 2022-04-23 DIAGNOSIS — I10 Essential (primary) hypertension: Secondary | ICD-10-CM

## 2022-04-23 MED ORDER — TELMISARTAN 40 MG PO TABS: 40.0000 mg | ORAL_TABLET | Freq: Every day | ORAL | 1 refills | Status: DC

## 2022-04-23 MED ORDER — VITAMIN D (ERGOCALCIFEROL) 1.25 MG (50000 UNIT) PO CAPS: 50000.0000 [IU] | ORAL_CAPSULE | ORAL | 2 refills | Status: DC

## 2022-04-23 NOTE — Progress Notes (Signed)
Please inform the patient that her vitamin D is low, upper scription for once weekly vitamin D supplement sent to her pharmacy to start taking.

## 2022-04-23 NOTE — Assessment & Plan Note (Addendum)
Uncontrolled Will discontinue telmisartan 20 mg today Will start patient on telmisartan 40 mg today Follow up in 4 weeks DASH diet reviewed Low-sodium diet encourage, recommended less than 1500 mg of sodium intake daily Encouraged to increase her fluid consumption, recommended at least 64 ounces of fluids daily We will get CBC and BMP today BP Readings from Last 3 Encounters:  04/23/22 (!) 154/92  04/19/22 (!) 144/98  03/23/22 (!) 142/90

## 2022-04-23 NOTE — Patient Instructions (Signed)
I appreciate the opportunity to provide care to you today!    Follow up:  1 months  Labs: please stop by the lab today to get your blood drawn (CBC, BMP)  Please pick up getting prescription at the pharmacy and start therapy I recommend low sodium diet, less than 1500 mg of daily sodium intake Increase your physical activities at least 3 days a week for at least 30 minutes Please increase your fluid consumption, at least 64 ounces of water daily   Please continue to a heart-healthy diet and increase your physical activities. Try to exercise for 61mns at least three times a week.      It was a pleasure to see you and I look forward to continuing to work together on your health and well-being. Please do not hesitate to call the office if you need care or have questions about your care.   Have a wonderful day and week. With Gratitude, GAlvira MondayMSN, FNP-BC

## 2022-04-23 NOTE — Progress Notes (Signed)
Established Patient Office Visit  Subjective:  Patient ID: Denise Khan, female    DOB: [birth year]  Age: 36 y.o. MRN: [id number]  CC:  Chief Complaint  Patient presents with   Follow-up    1 month f/u, pt reports headaches have stopped, went to orthopedic last week did get a high reading.    HPI Denise Khan is a 36 y.o. female with past medical history of hypertension presents for f/u of  chronic medical conditions.  Hypertension: She takes telmisartan 20 mg daily.  She denies headaches, dizziness, blurred vision, and chest pain.  She reports adherence with treatment regimen.   Past Medical History:  Diagnosis Date   Anxiety    Blood transfusion without reported diagnosis    Breech presentation 06/02/2020   Cancer [facility])    Cesarean delivery delivered 06/02/2020   Clotting disorder ([place])    PE (pulmonary embolism)     Past Surgical History:  Procedure Laterality Date   CESAREAN SECTION  06/02/2020   Procedure: CESAREAN SECTION;  Surgeon: Donnamae Jude, MD;  Location: [facility] LD [facility]S;  Service: Obstetrics;;   KNEE SURGERY     PORT-A-CATH REMOVAL      Family History  Problem Relation Age of Onset   CAD Father    Hypertension Father    Heart disease Father    Hypertension Mother    Cancer Maternal Grandmother        lung   Stroke Maternal Grandfather    Hypertension Maternal Grandfather    Kidney disease Maternal Grandfather    Hernia Son     Social History   Socioeconomic History   Marital status: Significant Other    Spouse name: Not on file   Number of children: 2   Years of education: 16   Highest education level: Not on file  Occupational History   Occupation: Patent attorney    Comment: Psychologist, 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  Tobacco Use   Smoking status: Never   Smokeless tobacco: Never  Vaping Use   Vaping Use: Never used  Substance and Sexual Activity   Alcohol use: Yes    Comment: 1-2 drinks per month   Drug use: Not Currently   Sexual activity: Yes     Birth control/protection: Implant    Comment: Nexplanon  Other Topics Concern   Not on file  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 with 62 yo son Stuckey; has infant son as well   His dad is Jeneen Rinks, lives with them   Social Determinants of Health   Financial Resource Strain: Low Risk  (12/07/2021)   Overall Financial Resource Strain (CARDIA)    Difficulty of Paying Living Expenses: Not very hard  Food Insecurity: No Food Insecurity (12/07/2021)   Hunger Vital Sign    Worried About Running Out of Food in the Last Year: Never true    [place] in the Last Year: Never true  Transportation Needs: No Transportation Needs (12/07/2021)   PRAPARE - Hydrologist (Medical): No    Lack of Transportation (Non-Medical): No  Physical Activity: Inactive (12/07/2021)   Exercise Vital Sign    Days of Exercise per Week: 0 days    Minutes of Exercise per Session: 0 min  Stress: No Stress Concern Present (12/07/2021)   [place]    Feeling of Stress : Only a little  Social Connections: Moderately Isolated (12/07/2021)  Social Licensed conveyancer [NHANES]    Frequency of Communication with Friends and Family: More than three times a week    Frequency of Social Gatherings with Friends and Family: Once a week    Attends Religious Services: Never    Marine scientist or Organizations: No    Attends Archivist Meetings: Never    Marital Status: Living with partner  Intimate Partner Violence: Not At Risk (12/07/2021)   Humiliation, Afraid, Rape, and Kick questionnaire    Fear of Current or Ex-Partner: No    Emotionally Abused: No    Physically Abused: No    Sexually Abused: No    Outpatient Medications Prior to Visit  Medication Sig Dispense Refill   diclofenac Sodium (VOLTAREN) 1 % GEL Apply 4 g topically 4 (four) times daily. 4 g 3   etonogestrel (NEXPLANON) 68 MG IMPL  implant 1 each by Subdermal route once.     rivaroxaban (XARELTO) 20 MG TABS tablet Take 20 mg by mouth daily with supper.     sertraline (ZOLOFT) 25 MG tablet Take 1 tablet (25 mg total) by mouth daily. 30 tablet 2   telmisartan (MICARDIS) 20 MG tablet TAKE 1 TABLET(20 MG) BY MOUTH DAILY 90 tablet 0   No facility-administered medications prior to visit.    No Known Allergies  ROS Review of Systems  Constitutional:  Negative for fatigue.  Eyes:  Negative for visual disturbance.  Respiratory:  Negative for chest tightness and shortness of breath.   Cardiovascular:  Negative for chest pain and palpitations.  Neurological:  Negative for dizziness and headaches.      Objective:    Physical Exam HENT:     Head: Normocephalic.  Cardiovascular:     Rate and Rhythm: Normal rate and regular rhythm.     Pulses: Normal pulses.     Heart sounds: Normal heart sounds.  Pulmonary:     Effort: Pulmonary effort is normal.     Breath sounds: Normal breath sounds.  Abdominal:     Palpations: Abdomen is soft.  Neurological:     Mental Status: She is alert and oriented to person, place, and time.     BP (!) 154/92 (BP Location: Left Arm)   Ht $R'5\' 10"'zl$  (1.778 m)   Wt 256 lb 1.9 oz (116.2 kg)   LMP 04/16/2022   BMI 36.75 kg/m  Wt Readings from Last 3 Encounters:  04/23/22 256 lb 1.9 oz (116.2 kg)  04/19/22 250 lb (113.4 kg)  03/23/22 255 lb 0.3 oz (115.7 kg)    Lab Results  Component Value Date   TSH 1.330 04/20/2022   Lab Results  Component Value Date   WBC 4.2 04/20/2022   HGB 13.4 04/20/2022   HCT 39.7 04/20/2022   MCV 98 (H) 04/20/2022   PLT 281 04/20/2022   Lab Results  Component Value Date   NA 142 04/20/2022   K 4.5 04/20/2022   CO2 21 04/20/2022   GLUCOSE 101 (H) 04/20/2022   BUN 10 04/20/2022   CREATININE 0.90 04/20/2022   BILITOT 0.6 04/20/2022   ALKPHOS 52 04/20/2022   AST 17 04/20/2022   ALT 17 04/20/2022   PROT 6.6 04/20/2022   ALBUMIN 4.4 04/20/2022    CALCIUM 9.2 04/20/2022   ANIONGAP 14 06/21/2021   EGFR 84 04/20/2022   Lab Results  Component Value Date   CHOL 136 04/20/2022   Lab Results  Component Value Date   HDL 46 04/20/2022   Lab  Results  Component Value Date   LDLCALC 81 04/20/2022   Lab Results  Component Value Date   TRIG 38 04/20/2022   Lab Results  Component Value Date   CHOLHDL 3.0 04/20/2022   Lab Results  Component Value Date   HGBA1C 5.4 04/20/2022      Assessment & Plan:   Problem List Items Addressed This Visit       Cardiovascular and Mediastinum   Primary hypertension - Primary    Uncontrolled Will discontinue telmisartan 20 mg today Will start patient on telmisartan 40 mg today Follow up in 4 weeks DASH diet reviewed Low-sodium diet encourage, recommended less than 1500 mg of sodium intake daily Encouraged to increase her fluid consumption, recommended at least 64 ounces of fluids daily We will get CBC and BMP today BP Readings from Last 3 Encounters:  04/23/22 (!) 154/92  04/19/22 (!) 144/98  03/23/22 (!) 142/90         Relevant Medications   telmisartan (MICARDIS) 40 MG tablet   Other Relevant Orders   CBC with Differential/Platelet   Basic Metabolic Panel (BMET)    Meds ordered this encounter  Medications   telmisartan (MICARDIS) 40 MG tablet    Sig: Take 1 tablet (40 mg total) by mouth daily.    Dispense:  30 tablet    Refill:  1    Follow-up: Return in about 1 month (around 05/24/2022) for BP.    Alvira Monday, FNP

## 2022-04-25 LAB — BASIC METABOLIC PANEL
BUN/Creatinine Ratio: 13 (ref 9–23)
BUN: 14 mg/dL (ref 6–20)
CO2: 22 mmol/L (ref 20–29)
Calcium: 9.4 mg/dL (ref 8.7–10.2)
Chloride: 105 mmol/L (ref 96–106)
Creatinine, Ser: 1.08 mg/dL — ABNORMAL HIGH (ref 0.57–1.00)
Glucose: 96 mg/dL (ref 70–99)
Potassium: 4.4 mmol/L (ref 3.5–5.2)
Sodium: 141 mmol/L (ref 134–144)
eGFR: 68 mL/min/{1.73_m2} (ref >59)

## 2022-04-25 LAB — CBC WITH DIFFERENTIAL/PLATELET
Basophils Absolute: 0 10*3/uL (ref 0.0–0.2)
Basos: 0 %
EOS (ABSOLUTE): 0.1 10*3/uL (ref 0.0–0.4)
Eos: 1 %
Hematocrit: 37.7 % (ref 34.0–46.6)
Hemoglobin: 13 g/dL (ref 11.1–15.9)
Immature Grans (Abs): 0 10*3/uL (ref 0.0–0.1)
Immature Granulocytes: 0 %
Lymphocytes Absolute: 2.3 10*3/uL (ref 0.7–3.1)
Lymphs: 37 %
MCH: 32.9 pg (ref 26.6–33.0)
MCHC: 34.5 g/dL (ref 31.5–35.7)
MCV: 95 fL (ref 79–97)
Monocytes Absolute: 0.5 10*3/uL (ref 0.1–0.9)
Monocytes: 9 %
Neutrophils Absolute: 3.3 10*3/uL (ref 1.4–7.0)
Neutrophils: 53 %
Platelets: 270 10*3/uL (ref 150–450)
RBC: 3.95 x10E6/uL (ref 3.77–5.28)
RDW: 11.6 % — ABNORMAL LOW (ref 11.7–15.4)
WBC: 6.2 10*3/uL (ref 3.4–10.8)

## 2022-04-25 NOTE — Progress Notes (Signed)
Please inform the patient that her kidney function and potassium levels are stable.  I recommend staying well-hydrated and drinking at least 64 ounces of fluids daily

## 2022-05-28 ENCOUNTER — Ambulatory Visit: Payer: BC Managed Care – PPO | Admitting: Family Medicine

## 2022-05-28 ENCOUNTER — Encounter: Payer: Self-pay | Admitting: Family Medicine

## 2022-06-27 ENCOUNTER — Other Ambulatory Visit: Payer: Self-pay

## 2022-07-08 ENCOUNTER — Encounter: Payer: Self-pay | Admitting: Emergency Medicine

## 2022-07-08 ENCOUNTER — Ambulatory Visit
Admission: EM | Admit: 2022-07-08 | Discharge: 2022-07-08 | Disposition: A | Discharge status: Home / Self Care | Payer: BC Managed Care – PPO | Attending: Nurse Practitioner | Admitting: Nurse Practitioner

## 2022-07-08 DIAGNOSIS — R6889 Other general symptoms and signs: Secondary | ICD-10-CM | POA: Insufficient documentation

## 2022-07-08 DIAGNOSIS — Z1152 Encounter for screening for COVID-19: Secondary | ICD-10-CM | POA: Diagnosis not present

## 2022-07-08 DIAGNOSIS — J039 Acute tonsillitis, unspecified: Secondary | ICD-10-CM | POA: Insufficient documentation

## 2022-07-08 LAB — POCT RAPID STREP A (OFFICE): Rapid Strep A Screen: NEGATIVE

## 2022-07-08 MED ORDER — LIDOCAINE VISCOUS HCL 2 % MT SOLN: 5.0000 mL | Freq: Four times a day (QID) | OROMUCOSAL | 0 refills | Status: DC | PRN

## 2022-07-08 MED ORDER — AMOXICILLIN 500 MG PO CAPS: 500.0000 mg | ORAL_CAPSULE | Freq: Two times a day (BID) | ORAL | 0 refills | Status: AC

## 2022-07-08 MED ORDER — OSELTAMIVIR PHOSPHATE 75 MG PO CAPS: 75.0000 mg | ORAL_CAPSULE | Freq: Two times a day (BID) | ORAL | 0 refills | Status: DC

## 2022-07-08 NOTE — ED Triage Notes (Signed)
Sore throat and bilateral ear pain x 3 days.

## 2022-07-08 NOTE — Discharge Instructions (Signed)
The rapid strep test was negative, a throat culture is pending.  I am also testing you for COVID.  If the pending test results are positive, you will be contacted to provide treatment.  If the throat culture results are negative, you will be asked to stop the antibiotic prescribed today. Take medication as prescribed. Continue use of Tylenol or ibuprofen as needed for pain, fever, or general discomfort. Recommend a soft diet until symptoms improve.  This includes soup, broth, yogurt, pudding, Jell-O, warm tea with honey or lemon, or cool liquids such as ice chips and water. Warm salt water gargles 3-4 times daily while symptoms persist. Increase fluids and allow for plenty of rest. If symptoms fail to improve and you are pending test results are negative, please follow-up with your primary care physician for further evaluation. Follow-up as needed.

## 2022-07-08 NOTE — ED Provider Notes (Signed)
[facility]    CSN: [id number] Arrival date & time: 07/08/22  0934      History   Chief Complaint No chief complaint on file.   HPI Denise Khan is a 37 y.o. female.   The history is provided by the patient.   Patient presents for complaints of fever, chills, body aches, sore throat, bilateral ear pain, and cough.  Patient states fever started 48 hours ago.  She states that 2 of her children had influenza A over the past week.  She denies headache, abdominal pain, nausea, vomiting, or diarrhea.  Reports she has been taking ibuprofen or Tylenol as needed for pain or discomfort.  Past Medical History:  Diagnosis Date   Anxiety    Blood transfusion without reported diagnosis    Breech presentation 06/02/2020   Cancer [facility])    Cesarean delivery delivered 06/02/2020   Clotting disorder [facility])    PE (pulmonary embolism)     Patient Active Problem List   Diagnosis Date Noted   Chronic pain of both knees 03/25/2022   Primary hypertension 03/25/2022   Nexplanon in place 12/07/2021   Pregnancy examination or test, negative result 12/07/2021   Encounter for gynecological examination with Papanicolaou smear of cervix 12/07/2021   Encounter for general adult medical examination with abnormal findings 02/08/2021   Anxiety and depression 02/08/2021   Gestational diabetes mellitus, class A2 03/23/2020   Uterine fibroid 02/06/2017   Environmental allergies 02/06/2017   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 Authorizing Provider  amoxicillin (AMOXIL) 500 MG capsule Take 1 capsule (500 mg total) by mouth 2 (two) times daily for 10 days. 07/08/22 07/18/22 Yes Mikias Lanz-Warren, Alda Lea, NP  lidocaine (XYLOCAINE) 2 % solution Use as directed 5 mLs in the mouth or throat every 6 (six) hours as needed for mouth pain. Gargle and spit 5 mL every 6 hours as needed for severe throat pain or discomfort. 07/08/22  Yes Edgerrin Correia-Warren, Alda Lea, NP  oseltamivir (TAMIFLU) 75 MG capsule Take 1 capsule (75 mg total) by mouth every 12 (twelve) hours. 07/08/22  Yes Tekeya Geffert-Warren, Alda Lea, NP  diclofenac Sodium (VOLTAREN) 1 % GEL Apply 4 g topically 4 (four) times daily. 04/19/22   Carole Civil, MD  etonogestrel (NEXPLANON) 68 MG IMPL implant 1 each by Subdermal route once.    [provider]  rivaroxaban (XARELTO) 20 MG TABS tablet Take 20 mg by mouth daily with supper.    [provider]  sertraline (ZOLOFT) 25 MG tablet Take 1 tablet (25 mg total) by mouth daily. 03/23/22   Alvira Monday, [place]  telmisartan (MICARDIS) 40 MG tablet Take 1 tablet (40 mg total) by mouth daily. 04/23/22   Alvira Monday, FNP  Vitamin D, Ergocalciferol, (DRISDOL) 1.25 MG (50000 UNIT) CAPS capsule Take 1 capsule (50,000 Units total) by mouth every 7 (seven) days. 04/23/22   Alvira Monday, FNP    Family History Family History  Problem Relation Age of Onset   CAD Father    Hypertension Father    Heart disease Father    Hypertension Mother    Cancer Maternal Grandmother        lung   Stroke Maternal Grandfather    Hypertension Maternal Grandfather    Kidney disease Maternal Grandfather    Hernia Son     Social History Social History   Tobacco Use   Smoking status: Never   Smokeless tobacco: Never  Vaping Use   Vaping Use: Never used  Substance Use Topics   Alcohol use: Yes    Comment: 1-2  drinks per month   Drug use: Not Currently     Allergies   Patient has no known allergies.   Review of Systems Review of Systems Per HPI  Physical Exam Triage Vital Signs ED Triage Vitals  Enc Vitals Group     BP 07/08/22 1109 (!) 173/108     Pulse Rate 07/08/22 1109 (!) 101     Resp 07/08/22 1109 18     Temp 07/08/22 1109 99.2 F (37.3 C)     Temp Source 07/08/22 1109 Oral     SpO2 07/08/22 1109 97 %     Weight --      Height --      Head Circumference --      Peak Flow --      Pain Score 07/08/22 1111 9     Pain Loc --      Pain Edu? --      Excl. in [place]? --    No data found.  Updated Vital Signs BP (!) 173/108 (BP Location: Right Arm)   Pulse (!) 101   Temp 99.2 F (37.3 C) (Oral)   Resp 18   LMP 06/25/2022 (Exact Date)   SpO2 97%   Visual Acuity Right Eye Distance:   Left Eye Distance:   Bilateral Distance:    Right Eye Near:   Left Eye Near:    Bilateral Near:     Physical Exam Constitutional:      Appearance: She is well-developed.  HENT:     Head: Normocephalic and atraumatic.     Right Ear: Tympanic membrane, ear canal and external ear normal.     Left Ear: Tympanic membrane, ear canal and external ear normal.     Nose: Congestion present. No rhinorrhea.     Mouth/Throat:     Lips: Pink.     Mouth: Mucous membranes are moist.     Pharynx: Uvula midline. Pharyngeal swelling, posterior oropharyngeal erythema and uvula swelling present.     Tonsils: 2+ on the right. 2+ on the left.  Eyes:     Conjunctiva/sclera: Conjunctivae normal.     Pupils: Pupils are equal, round, and reactive to light.  Neck:     Thyroid: No thyromegaly.     Trachea: No tracheal deviation.  Cardiovascular:     Rate and Rhythm: Normal rate and regular rhythm.     Pulses: Normal pulses.     Heart sounds: Normal heart sounds.  Pulmonary:     Effort: Pulmonary effort is normal.  Breath sounds: Normal breath sounds.  Abdominal:     General: Bowel sounds are  normal. There is no distension.     Palpations: Abdomen is soft.     Tenderness: There is no abdominal tenderness.  Musculoskeletal:     Cervical back: Normal range of motion.  Lymphadenopathy:     Cervical: Cervical adenopathy present.  Skin:    General: Skin is warm and dry.  Neurological:     General: No focal deficit present.     Mental Status: She is alert and oriented to person, place, and time.  Psychiatric:        Mood and Affect: Mood normal.        Behavior: Behavior normal.        Thought Content: Thought content normal.        Judgment: Judgment normal.      UC Treatments / Results  Labs (all labs ordered are listed, but only abnormal results are displayed) Labs Reviewed  CULTURE, GROUP A STREP ([facility])  SARS CORONAVIRUS 2 (TAT 6-24 HRS)  POCT RAPID STREP A (OFFICE)    EKG   Radiology No results found.  Procedures Procedures (including critical care time)  Medications Ordered in UC Medications - No data to display  Initial Impression / Assessment and Plan / UC Course  I have reviewed the triage vital signs and the nursing notes.  Pertinent labs & imaging results that were available during my care of the patient were reviewed by me and considered in my medical decision making (see chart for details).  The patient appears to be uncomfortable due to her throat pain.  She is hypertensive.  Based on the patient's physical exam, will treat for acute tonsillitis.  Patient has +2 tonsil swelling with uvula swelling and moderate erythema.  Rule out influenza with her recent exposure, because she is symptomatic and fever started with over the past 48 hours, will start patient on Tamiflu 75 mg.  The tonsillitis, will treat with amoxicillin 500 mg twice daily, and for throat pain and discomfort, viscous lidocaine 2% was also prescribed.  Supportive care recommendations were provided to the patient.  Throat culture and COVID test are pending.  Patient was advised she  will be contacted if the pending test results are positive.  Patient is a candidate to receive COVID if her COVID test is positive.  Patient verbalizes understanding.  All questions were answered.  Patient stable for discharge.  Work note was provided.   Final Clinical Impressions(s) / UC Diagnoses   Final diagnoses:  Acute tonsillitis, unspecified etiology  Flu-like symptoms  Encounter for screening for COVID-19     Discharge Instructions      The rapid strep test was negative, a throat culture is pending.  I am also testing you for COVID.  If the pending test results are positive, you will be contacted to provide treatment.  If the throat culture results are negative, you will be asked to stop the antibiotic prescribed today. Take medication as prescribed. Continue use of Tylenol or ibuprofen as needed for pain, fever, or general discomfort. Recommend a soft diet until symptoms improve.  This includes soup, broth, yogurt, pudding, Jell-O, warm tea with honey or lemon, or cool liquids such as ice chips and water. Warm salt water gargles 3-4 times daily while symptoms persist. Increase fluids and allow for plenty of rest. If symptoms fail to improve and you are pending test results are negative, please follow-up with your primary care physician  for further evaluation. Follow-up as needed.      ED Prescriptions     Medication Sig Dispense Auth. Provider   amoxicillin (AMOXIL) 500 MG capsule Take 1 capsule (500 mg total) by mouth 2 (two) times daily for 10 days. 20 capsule Taraann Olthoff-Warren, Alda Lea, NP   lidocaine (XYLOCAINE) 2 % solution Use as directed 5 mLs in the mouth or throat every 6 (six) hours as needed for mouth pain. Gargle and spit 5 mL every 6 hours as needed for severe throat pain or discomfort. 100 mL Crist Kruszka-Warren, Alda Lea, NP   oseltamivir (TAMIFLU) 75 MG capsule Take 1 capsule (75 mg total) by mouth every 12 (twelve) hours. 10 capsule Meriam Chojnowski-Warren, Alda Lea, NP       PDMP not reviewed this encounter.   Tish Men, NP 07/08/22 1146

## 2022-07-09 LAB — SARS CORONAVIRUS 2 (TAT 6-24 HRS): SARS Coronavirus 2: NEGATIVE

## 2022-07-10 LAB — CULTURE, GROUP A STREP (THRC)

## 2022-08-30 ENCOUNTER — Encounter: Payer: Self-pay | Admitting: Radiology

## 2022-09-13 ENCOUNTER — Other Ambulatory Visit: Payer: Self-pay | Admitting: Family Medicine

## 2022-09-13 DIAGNOSIS — I10 Essential (primary) hypertension: Secondary | ICD-10-CM

## 2022-09-16 ENCOUNTER — Encounter: Payer: Self-pay | Admitting: Family Medicine

## 2022-09-27 ENCOUNTER — Ambulatory Visit: Payer: BC Managed Care – PPO | Admitting: Family Medicine

## 2022-09-27 ENCOUNTER — Encounter: Payer: Self-pay | Admitting: Family Medicine

## 2022-09-27 ENCOUNTER — Other Ambulatory Visit: Payer: Self-pay | Admitting: Family Medicine

## 2022-09-27 VITALS — BP 148/102 | HR 77 | Ht 70.0 in | Wt 248.0 lb

## 2022-09-27 DIAGNOSIS — I10 Essential (primary) hypertension: Secondary | ICD-10-CM

## 2022-09-27 MED ORDER — TELMISARTAN 40 MG PO TABS: 40.0000 mg | ORAL_TABLET | Freq: Every day | ORAL | 1 refills | Status: DC

## 2022-09-27 NOTE — Progress Notes (Signed)
Established Patient Office Visit  Subjective:  Patient ID: Denise Khan, female    DOB: [birth year]  Age: 37 y.o. MRN: [id number]  CC:  Chief Complaint  Patient presents with   Hypertension    Needs refills on bp medication ran out a week ago.     HPI Denise Khan is a 37 y.o. female with past medical history of high blood pressure presents for f/u. For the details of today's visit, please refer to the assessment and plan.     Past Medical History:  Diagnosis Date   Anxiety    Blood transfusion without reported diagnosis    Breech presentation 06/02/2020   Cancer [facility])    Cesarean delivery delivered 06/02/2020   Clotting disorder ([place])    PE (pulmonary embolism)     Past Surgical History:  Procedure Laterality Date   CESAREAN SECTION  06/02/2020   Procedure: CESAREAN SECTION;  Surgeon: Donnamae Jude, MD;  Location: [facility] LD ORS;  Service: Obstetrics;;   KNEE SURGERY     PORT-A-CATH REMOVAL      Family History  Problem Relation Age of Onset   CAD Father    Hypertension Father    Heart disease Father    Hypertension Mother    Cancer Maternal Grandmother        lung   Stroke Maternal Grandfather    Hypertension Maternal Grandfather    Kidney disease Maternal Grandfather    Hernia Son     Social History   Socioeconomic History   Marital status: Significant Other    Spouse name: Not on file   Number of children: 2   Years of education: 16   Highest education level: Not on file  Occupational History   Occupation: Patent attorney    Comment: Psychologist, 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  Tobacco Use   Smoking status: Never   Smokeless tobacco: Never  Vaping Use   Vaping Use: Never used  Substance and Sexual Activity   Alcohol use: Yes    Comment: 1-2 drinks per month   Drug use: Not Currently   Sexual activity: Yes    Birth control/protection: Implant    Comment: Nexplanon  Other Topics Concern   Not on file  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 with  22 yo son Sprowl; has infant son as well   His dad is Jeneen Rinks, lives with them   Social Determinants of Health   Financial Resource Strain: Low Risk  (12/07/2021)   Overall Financial Resource Strain (CARDIA)    Difficulty of Paying Living Expenses: Not very hard  Food Insecurity: No Food Insecurity (12/07/2021)   Hunger Vital Sign    Worried About Running Out of Food in the Last Year: Never true    [place] in the Last Year: Never true  Transportation Needs: No Transportation Needs (12/07/2021)   PRAPARE - Hydrologist (Medical): No    Lack of Transportation (Non-Medical): No  Physical Activity: Inactive (12/07/2021)   Exercise Vital Sign    Days of Exercise per Week: 0 days    Minutes of Exercise per Session: 0 min  Stress: No Stress Concern Present (12/07/2021)   [place]    Feeling of Stress : Only a little  Social Connections: Moderately Isolated (12/07/2021)   Social Connection and Isolation Panel [NHANES]    Frequency of Communication with Friends and Family: More than three times  a week    Frequency of Social Gatherings with Friends and Family: Once a week    Attends Religious Services: Never    Marine scientist or Organizations: No    Attends Archivist Meetings: Never    Marital Status: Living with partner  Intimate Partner Violence: Not At Risk (12/07/2021)   Humiliation, Afraid, Rape, and Kick questionnaire    Fear of Current or Ex-Partner: No    Emotionally Abused: No    Physically Abused: No    Sexually Abused: No    Outpatient Medications Prior to Visit  Medication Sig Dispense Refill   diclofenac Sodium (VOLTAREN) 1 % GEL Apply 4 g topically 4 (four) times daily. 4 g 3   etonogestrel (NEXPLANON) 68 MG IMPL implant 1 each by Subdermal route once.     lidocaine (XYLOCAINE) 2 % solution Use as directed 5 mLs in the mouth or throat every 6 (six) hours as needed  for mouth pain. Gargle and spit 5 mL every 6 hours as needed for severe throat pain or discomfort. 100 mL 0   oseltamivir (TAMIFLU) 75 MG capsule Take 1 capsule (75 mg total) by mouth every 12 (twelve) hours. 10 capsule 0   rivaroxaban (XARELTO) 20 MG TABS tablet Take 20 mg by mouth daily with supper.     sertraline (ZOLOFT) 25 MG tablet Take 1 tablet (25 mg total) by mouth daily. 30 tablet 2   Vitamin D, Ergocalciferol, (DRISDOL) 1.25 MG (50000 UNIT) CAPS capsule Take 1 capsule (50,000 Units total) by mouth every 7 (seven) days. 5 capsule 2   telmisartan (MICARDIS) 40 MG tablet Take 1 tablet (40 mg total) by mouth daily. (Patient not taking: Reported on 09/27/2022) 30 tablet 1   No facility-administered medications prior to visit.    No Known Allergies  ROS Review of Systems  Constitutional:  Negative for chills and fever.  Eyes:  Negative for visual disturbance.  Respiratory:  Negative for chest tightness and shortness of breath.   Neurological:  Positive for headaches. Negative for dizziness.      Objective:    Physical Exam HENT:     Head: Normocephalic.     Mouth/Throat:     Mouth: Mucous membranes are moist.  Cardiovascular:     Rate and Rhythm: Normal rate.     Heart sounds: Normal heart sounds.  Pulmonary:     Effort: Pulmonary effort is normal.     Breath sounds: Normal breath sounds.  Neurological:     Mental Status: She is alert.     BP (!) 148/102 (BP Location: Left Arm)   Pulse 77   Ht 5\' 10"  (1.778 m)   Wt 248 lb 0.6 oz (112.5 kg)   SpO2 98%   BMI 35.59 kg/m  Wt Readings from Last 3 Encounters:  09/27/22 248 lb 0.6 oz (112.5 kg)  04/23/22 256 lb 1.9 oz (116.2 kg)  04/19/22 250 lb (113.4 kg)    Lab Results  Component Value Date   TSH 1.330 04/20/2022   Lab Results  Component Value Date   WBC 6.2 04/23/2022   HGB 13.0 04/23/2022   HCT 37.7 04/23/2022   MCV 95 04/23/2022   PLT 270 04/23/2022   Lab Results  Component Value Date   NA 141  09/27/2022   K 4.2 09/27/2022   CO2 20 09/27/2022   GLUCOSE 83 09/27/2022   BUN 12 09/27/2022   CREATININE 0.74 09/27/2022   BILITOT 0.6 04/20/2022   ALKPHOS 52  04/20/2022   AST 17 04/20/2022   ALT 17 04/20/2022   PROT 6.6 04/20/2022   ALBUMIN 4.4 04/20/2022   CALCIUM 9.1 09/27/2022   ANIONGAP 14 06/21/2021   EGFR 107 09/27/2022   Lab Results  Component Value Date   CHOL 136 04/20/2022   Lab Results  Component Value Date   HDL 46 04/20/2022   Lab Results  Component Value Date   LDLCALC 81 04/20/2022   Lab Results  Component Value Date   TRIG 38 04/20/2022   Lab Results  Component Value Date   CHOLHDL 3.0 04/20/2022   Lab Results  Component Value Date   HGBA1C 5.4 04/20/2022      Assessment & Plan:  Primary hypertension Assessment & Plan: Uncontrolled She has been out of her blood pressure medication for a week She takes telmisartan 40 mg today Encourage low-sodium diet encourage, recommended less than 1500 mg of sodium intake daily Encouraged to increase her fluid consumption, recommended at least 64 ounces of fluids daily We assess her  BMP today BP Readings from Last 3 Encounters:  09/27/22 (!) 148/102  07/08/22 (!) 173/108  04/23/22 (!) 154/92     Orders: -     Telmisartan; Take 1 tablet (40 mg total) by mouth daily.  Dispense: 30 tablet; Refill: 1 -     BMP8+EGFR    Follow-up: Return in about 2 weeks (around 10/11/2022) for BP.   Alvira Monday, FNP

## 2022-09-27 NOTE — Patient Instructions (Addendum)
I appreciate the opportunity to provide care to you today!    Follow up:  2 weeks for BP  Labs: please stop by the lab today to get your blood drawn (BMP)    Please continue to a heart-healthy diet and increase your physical activities. Try to exercise for 6mins at least five days a week.      It was a pleasure to see you and I look forward to continuing to work together on your health and well-being. Please do not hesitate to call the office if you need care or have questions about your care.   Have a wonderful day and week. With Gratitude, Alvira Monday MSN, FNP-BC

## 2022-09-28 LAB — BMP8+EGFR
BUN/Creatinine Ratio: 16 (ref 9–23)
BUN: 12 mg/dL (ref 6–20)
CO2: 20 mmol/L (ref 20–29)
Calcium: 9.1 mg/dL (ref 8.7–10.2)
Chloride: 107 mmol/L — ABNORMAL HIGH (ref 96–106)
Creatinine, Ser: 0.74 mg/dL (ref 0.57–1.00)
Glucose: 83 mg/dL (ref 70–99)
Potassium: 4.2 mmol/L (ref 3.5–5.2)
Sodium: 141 mmol/L (ref 134–144)
eGFR: 107 mL/min/{1.73_m2} (ref >59)

## 2022-09-28 NOTE — Progress Notes (Signed)
Please inform the patient that her labs are stable

## 2022-09-28 NOTE — Assessment & Plan Note (Signed)
Uncontrolled She has been out of her blood pressure medication for a week She takes telmisartan 40 mg today Encourage low-sodium diet encourage, recommended less than 1500 mg of sodium intake daily Encouraged to increase her fluid consumption, recommended at least 64 ounces of fluids daily We assess her  BMP today BP Readings from Last 3 Encounters:  09/27/22 (!) 148/102  07/08/22 (!) 173/108  04/23/22 (!) 154/92

## 2022-10-06 ENCOUNTER — Encounter (HOSPITAL_COMMUNITY): Payer: Self-pay | Admitting: Emergency Medicine

## 2022-10-06 ENCOUNTER — Other Ambulatory Visit: Payer: Self-pay

## 2022-10-06 ENCOUNTER — Emergency Department (HOSPITAL_COMMUNITY)
Admission: EM | Admit: 2022-10-06 | Discharge: 2022-10-06 | Disposition: A | Discharge status: Home / Self Care | Payer: BC Managed Care – PPO | Attending: Emergency Medicine | Admitting: Emergency Medicine

## 2022-10-06 ENCOUNTER — Encounter: Payer: Self-pay | Admitting: Family Medicine

## 2022-10-06 ENCOUNTER — Emergency Department (HOSPITAL_COMMUNITY): Payer: BC Managed Care – PPO

## 2022-10-06 ENCOUNTER — Telehealth: Payer: BC Managed Care – PPO | Admitting: Nurse Practitioner

## 2022-10-06 DIAGNOSIS — Z7901 Long term (current) use of anticoagulants: Secondary | ICD-10-CM | POA: Diagnosis not present

## 2022-10-06 DIAGNOSIS — I10 Essential (primary) hypertension: Secondary | ICD-10-CM | POA: Insufficient documentation

## 2022-10-06 DIAGNOSIS — R519 Headache, unspecified: Secondary | ICD-10-CM | POA: Diagnosis present

## 2022-10-06 DIAGNOSIS — Z79899 Other long term (current) drug therapy: Secondary | ICD-10-CM | POA: Diagnosis not present

## 2022-10-06 DIAGNOSIS — I169 Hypertensive crisis, unspecified: Secondary | ICD-10-CM

## 2022-10-06 HISTORY — DX: Essential (primary) hypertension: I10

## 2022-10-06 LAB — BASIC METABOLIC PANEL
Anion gap: 9 (ref 5–15)
BUN: 8 mg/dL (ref 6–20)
CO2: 24 mmol/L (ref 22–32)
Calcium: 8.8 mg/dL — ABNORMAL LOW (ref 8.9–10.3)
Chloride: 102 mmol/L (ref 98–111)
Creatinine, Ser: 0.89 mg/dL (ref 0.44–1.00)
GFR, Estimated: >60 mL/min (ref >60)
Glucose, Bld: 101 mg/dL — ABNORMAL HIGH (ref 70–99)
Potassium: 3.8 mmol/L (ref 3.5–5.1)
Sodium: 135 mmol/L (ref 135–145)

## 2022-10-06 LAB — CBC
HCT: 41.4 % (ref 36.0–46.0)
Hemoglobin: 14.2 g/dL (ref 12.0–15.0)
MCH: 32.3 pg (ref 26.0–34.0)
MCHC: 34.3 g/dL (ref 30.0–36.0)
MCV: 94.1 fL (ref 80.0–100.0)
Platelets: 248 10*3/uL (ref 150–400)
RBC: 4.4 MIL/uL (ref 3.87–5.11)
RDW: 12.4 % (ref 11.5–15.5)
WBC: 7.2 10*3/uL (ref 4.0–10.5)
nRBC: 0 % (ref 0.0–0.2)

## 2022-10-06 MED ORDER — ONDANSETRON 8 MG PO TBDP
8.0000 mg | ORAL_TABLET | Freq: Once | ORAL | Status: AC
  Administered 2022-10-06: 8 mg via ORAL
  Filled 2022-10-06: qty 1

## 2022-10-06 MED ORDER — HYDRALAZINE HCL 20 MG/ML IJ SOLN
10.0000 mg | Freq: Once | INTRAMUSCULAR | Status: AC
  Administered 2022-10-06: 10 mg via INTRAVENOUS
  Filled 2022-10-06: qty 1

## 2022-10-06 MED ORDER — LABETALOL HCL 5 MG/ML IV SOLN
20.0000 mg | Freq: Once | INTRAVENOUS | Status: AC
  Administered 2022-10-06: 20 mg via INTRAVENOUS
  Filled 2022-10-06: qty 4

## 2022-10-06 MED ORDER — TRAMADOL HCL 50 MG PO TABS
50.0000 mg | ORAL_TABLET | Freq: Once | ORAL | Status: AC
  Administered 2022-10-06: 50 mg via ORAL
  Filled 2022-10-06: qty 1

## 2022-10-06 MED ORDER — ACETAMINOPHEN 500 MG PO TABS
1000.0000 mg | ORAL_TABLET | Freq: Once | ORAL | Status: AC
  Administered 2022-10-06: 1000 mg via ORAL
  Filled 2022-10-06: qty 2

## 2022-10-06 MED ORDER — AMLODIPINE BESYLATE 5 MG PO TABS: 5.0000 mg | ORAL_TABLET | Freq: Every day | ORAL | 0 refills | Status: DC

## 2022-10-06 NOTE — ED Notes (Signed)
Went in to discharge patient and she was very nauseated, obtained order for zofran and will reassess in 15 min

## 2022-10-06 NOTE — Progress Notes (Signed)
Virtual Visit Consent   Denise Khan, you are scheduled for a virtual visit with Denise Daphine Deutscher, FNP, a [facility] provider, today.     Just as with appointments in the office, your consent must be obtained to participate.  Your consent will be active for this visit and any virtual visit you may have with one of our providers in the next 365 days.     If you have a MyChart account, a copy of this consent can be sent to you electronically.  All virtual visits are billed to your insurance company just like a traditional visit in the office.    As this is a virtual visit, video technology does not allow for your provider to perform a traditional examination.  This may limit your provider's ability to fully assess your condition.  If your provider identifies any concerns that need to be evaluated in person or the need to arrange testing (such as labs, EKG, etc.), we will make arrangements to do so.     Although advances in technology are sophisticated, we cannot ensure that it will always work on either your end or our end.  If the connection with a video visit is poor, the visit may have to be switched to a telephone visit.  With either a video or telephone visit, we are not always able to ensure that we have a secure connection.     I need to obtain your verbal consent now.   Are you willing to proceed with your visit today? YES   Aurellia Vitrano Cuartas has provided verbal consent on 10/06/2022 for a virtual visit (video or telephone).   Denise Daphine Deutscher, FNP   Date: 10/06/2022 5:53 PM   Virtual Visit via Video Note   I, Denise Nitin Mckowen, connected with Denise Khan ([id number], 23-Nov-1985) on 10/06/22 at  6:00 PM EDT by a video-enabled telemedicine application and verified that I am speaking with the correct person using two identifiers.  Location: Patient: Virtual Visit Location Patient: Home Provider: Virtual Visit Location Provider: Mobile   I discussed the  limitations of evaluation and management by telemedicine and the availability of in person appointments. The patient expressed understanding and agreed to proceed.    History of Present Illness: Denise Khan is a 37 y.o. who identifies as a female who was assigned female at birth, and is being seen today for blood pressure issues.  HPI: Patient blood pressure has been elevated for 2 days. She took 2 of her micardis today and no better.  Her blood pressure has been 195-202/130-135. She has had some palpitations and heart racing. Headache. No vertigo.  Hypertension This is a new problem. The current episode started yesterday. Associated symptoms include headaches. Pertinent negatives include no blurred vision or peripheral edema. Risk factors for coronary artery disease include dyslipidemia and obesity. Past treatments include alpha 1 blockers. The current treatment provides no improvement.    Review of Systems  Eyes:  Negative for blurred vision.  Neurological:  Positive for headaches.    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 Gargle and spit 5 mL every 6 hours as needed for severe throat pain or discomfort., Disp: 100 mL, Rfl: 0   oseltamivir (TAMIFLU) 75 MG capsule, Take 1 capsule (75 mg total) by mouth every 12 (twelve) hours., Disp: 10 capsule, Rfl: 0   rivaroxaban (XARELTO) 20 MG TABS tablet, Take 20 mg by mouth daily with supper., Disp: , Rfl:    sertraline (ZOLOFT) 25 MG tablet, Take 1 tablet (25 mg total) by mouth daily., Disp: 30 tablet, Rfl: 2   telmisartan (MICARDIS) 40 MG tablet, Take 1 tablet (40 mg total) by mouth daily., Disp: 30 tablet, Rfl: 1   Vitamin D, Ergocalciferol, (DRISDOL) 1.25 MG (50000 UNIT) CAPS capsule, Take 1 capsule (50,000 Units total) by mouth every 7 (seven) days., Disp: 5 capsule, Rfl: 2  Observations/Objective: Patient is well-developed, well-nourished in no acute distress.  Resting comfortably  at home.  Head is normocephalic, atraumatic.  No labored breathing.  Speech is clear and coherent with logical content.  Patient is alert and oriented at baseline.    Assessment and Plan:  Sofya R Reiling in today with chief complaint of Hypertension   1. Hypertensive crisis Need to go to the ED Needs repeat blood work and get EKG     Follow Up Instructions: I discussed the assessment and treatment plan with the patient. The patient was provided an opportunity to ask questions and all were answered. The patient agreed with the plan and demonstrated an understanding of the instructions.  A copy of instructions were sent to the patient via MyChart.  The patient was advised to call back or seek an in-person evaluation if the symptoms worsen or if the condition fails to improve as anticipated.  Time:  I spent 13 minutes with the patient via telehealth technology discussing the above problems/concerns.    Denise Daphine Deutscher,  FNP

## 2022-10-06 NOTE — ED Provider Notes (Addendum)
[place] EMERGENCY DEPARTMENT AT [facility] Provider Note   CSN: [id number] Arrival date & time: 10/06/22  1826     History  Chief Complaint  Patient presents with   Hypertension    Denise [place]avia R Khan is a 37 y.o. female.  Pt c/o concern about her high blood pressure. Indicates it has been high for past few weeks but pcp has not yet increased blood pressure med. Indicates is taking med as prescribed. Indicates intermittent frontal headaches, more frequently in past two weeks which is unusual for her. No abrupt/severe head pain. No eye pain or change in vision. No neck pain or stiffness. No change in speech. No numbness or weakness or loss of normal functional ability. No sinus drainage or pain. No uri symptoms. No fever or chills. No chest pain or sob. No abd pain or nv. No extremity pain/swelling. Has been having regular periods including this past month.   The history is provided by the patient and the spouse.  Hypertension Associated symptoms include headaches. Pertinent negatives include no chest pain, no abdominal pain and no shortness of breath.       Home Medications Prior to Admission medications   Medication Sig Start Date End Date Taking? Authorizing Provider  amLODipine (NORVASC) 5 MG tablet Take 1 tablet (5 mg total) by mouth daily. 10/06/22  Yes Cathren LaineSteinl, Tomika Eckles, MD  diclofenac Sodium (VOLTAREN) 1 % GEL Apply 4 g topically 4 (four) times daily. 04/19/22   Vickki HearingHarrison, Stanley E, MD  etonogestrel (NEXPLANON) 68 MG IMPL implant 1 each by Subdermal route once.    [provider]  lidocaine (XYLOCAINE) 2 % solution Use as directed 5 mLs in the mouth or throat every 6 (six) hours as needed for mouth pain. Gargle and spit 5 mL every 6 hours as needed for severe throat pain or discomfort. 07/08/22   Leath-Warren, Sadie Haberhristie J, NP  oseltamivir (TAMIFLU) 75 MG capsule Take 1 capsule (75 mg total) by mouth every 12 (twelve) hours. 07/08/22   Leath-Warren, Sadie Haberhristie J, NP   rivaroxaban (XARELTO) 20 MG TABS tablet Take 20 mg by mouth daily with supper.    [provider]  sertraline (ZOLOFT) 25 MG tablet Take 1 tablet (25 mg total) by mouth daily. 03/23/22   Gilmore LarocheZarwolo, Gloria, FNP  telmisartan (MICARDIS) 40 MG tablet Take 1 tablet (40 mg total) by mouth daily. 09/27/22   Gilmore LarocheZarwolo, Gloria, FNP  Vitamin D, Ergocalciferol, (DRISDOL) 1.25 MG (50000 UNIT) CAPS capsule Take 1 capsule (50,000 Units total) by mouth every 7 (seven) days. 04/23/22   Gilmore LarocheZarwolo, Gloria, FNP      Allergies    Patient has no known allergies.    Review of Systems   Review of Systems  Constitutional:  Negative for fever.  HENT:  Negative for sinus pressure and sore throat.   Eyes:  Negative for redness and visual disturbance.  Respiratory:  Negative for cough and shortness of breath.   Cardiovascular:  Negative for chest pain and leg swelling.  Gastrointestinal:  Negative for abdominal pain and vomiting.  Genitourinary:  Negative for dysuria and flank pain.  Musculoskeletal:  Negative for back pain, neck pain and neck stiffness.  Skin:  Negative for rash.  Neurological:  Positive for headaches. Negative for speech difficulty, weakness and numbness.  Hematological:  Does not bruise/bleed easily.  Psychiatric/Behavioral:  Negative for confusion.     Physical Exam Updated Vital Signs BP (!) 178/100   Pulse 92   Temp 98.2 F (36.8 C)  Resp 18   Wt 112 kg   LMP 09/13/2022 (Approximate)   SpO2 100%   BMI 35.43 kg/m  Physical Exam Vitals and nursing note reviewed.  Constitutional:      Appearance: Normal appearance. She is well-developed.  HENT:     Head: Atraumatic.     Comments: No sinus or temporal tenderness.     Nose: Nose normal.     Mouth/Throat:     Mouth: Mucous membranes are moist.  Eyes:     General: No scleral icterus.    Conjunctiva/sclera: Conjunctivae normal.     Pupils: Pupils are equal, round, and reactive to light.  Neck:     Vascular: No carotid  bruit.     Trachea: No tracheal deviation.     Comments: No stiffness or rigidity Cardiovascular:     Rate and Rhythm: Normal rate and regular rhythm.     Pulses: Normal pulses.     Heart sounds: Normal heart sounds. No murmur heard.    No friction rub. No gallop.  Pulmonary:     Effort: Pulmonary effort is normal. No respiratory distress.     Breath sounds: Normal breath sounds.  Abdominal:     General: There is no distension.     Palpations: Abdomen is soft.     Tenderness: There is no abdominal tenderness.     Comments: No bruits.   Musculoskeletal:        General: No swelling or tenderness.     Cervical back: Normal range of motion and neck supple. No rigidity. No muscular tenderness.     Right lower leg: No edema.     Left lower leg: No edema.  Skin:    General: Skin is warm and dry.     Findings: No rash.  Neurological:     Mental Status: She is alert.     Comments: Alert, speech normal.  Motor/sens grossly intact bil. Steady gait.   Psychiatric:        Mood and Affect: Mood normal.     ED Results / Procedures / Treatments   Labs (all labs ordered are listed, but only abnormal results are displayed) Results for orders placed or performed during the hospital encounter of 10/06/22  Basic metabolic panel  Result Value Ref Range   Sodium 135 135 - 145 mmol/L   Potassium 3.8 3.5 - 5.1 mmol/L   Chloride 102 98 - 111 mmol/L   CO2 24 22 - 32 mmol/L   Glucose, Bld 101 (H) 70 - 99 mg/dL   BUN 8 6 - 20 mg/dL   Creatinine, Ser 3.81 0.44 - 1.00 mg/dL   Calcium 8.8 (L) 8.9 - 10.3 mg/dL   GFR, Estimated >01 >75 mL/min   Anion gap 9 5 - 15  CBC  Result Value Ref Range   WBC 7.2 4.0 - 10.5 K/uL   RBC 4.40 3.87 - 5.11 MIL/uL   Hemoglobin 14.2 12.0 - 15.0 g/dL   HCT 10.2 58.5 - 27.7 %   MCV 94.1 80.0 - 100.0 fL   MCH 32.3 26.0 - 34.0 pg   MCHC 34.3 30.0 - 36.0 g/dL   RDW 82.4 23.5 - 36.1 %   Platelets 248 150 - 400 K/uL   nRBC 0.0 0.0 - 0.2 %     EKG None  Radiology CT Head Wo Contrast  Result Date: 10/06/2022 CLINICAL DATA:  Headache EXAM: CT HEAD WITHOUT CONTRAST TECHNIQUE: Contiguous axial images were obtained from the base of the skull through  the vertex without intravenous contrast. RADIATION DOSE REDUCTION: This exam was performed according to the departmental dose-optimization program which includes automated exposure control, adjustment of the mA and/or kV according to patient size and/or use of iterative reconstruction technique. COMPARISON:  Head CT 01/19/2013 FINDINGS: Brain: No evidence of acute infarction, hemorrhage, hydrocephalus, extra-axial collection or mass lesion/mass effect. Vascular: No hyperdense vessel or unexpected calcification. Skull: Normal. Negative for fracture or focal lesion. Sinuses/Orbits: No acute finding. Other: None. IMPRESSION: No acute intracranial abnormality. Electronically Signed   By: Darliss Cheney M.D.   On: 10/06/2022 19:58    Procedures Procedures    Medications Ordered in ED Medications  ondansetron (ZOFRAN-ODT) disintegrating tablet 8 mg (has no administration in time range)  labetalol (NORMODYNE) injection 20 mg (20 mg Intravenous Given 10/06/22 1924)  acetaminophen (TYLENOL) tablet 1,000 mg (1,000 mg Oral Given 10/06/22 2034)  hydrALAZINE (APRESOLINE) injection 10 mg (10 mg Intravenous Given 10/06/22 2034)  traMADol (ULTRAM) tablet 50 mg (50 mg Oral Given 10/06/22 2136)    ED Course/ Medical Decision Making/ A&P                             Medical Decision Making Problems Addressed: Essential hypertension: chronic illness or injury with exacerbation, progression, or side effects of treatment that poses a threat to life or bodily functions Uncontrolled hypertension: acute illness or injury that poses a threat to life or bodily functions  Amount and/or Complexity of Data Reviewed Independent Historian:     Details: Family, hx External Data Reviewed: labs and notes. Labs: ordered.  Decision-making details documented in ED Course. Radiology: ordered and independent interpretation performed. Decision-making details documented in ED Course.  Risk OTC drugs. Prescription drug management. Decision regarding hospitalization.   Iv ns. Continuous pulse ox and cardiac monitoring. Labs ordered/sent. Imaging ordered.   Differential diagnosis includes uncontrolled htn, aki, etc . Dispo decision including potential need for admission considered - will get labs and imaging and reassess.   Reviewed nursing notes and prior charts for additional history. External reports reviewed. Additional history from: family.   Cardiac monitor: sinus rhythm, rate 83.  Recheck bp, remains high, labetalol iv. Acetaminophen po.   Labs reviewed/interpreted by me - chem/cr normal.   CT reviewed/interpreted by me - no hem.   Bp is high. Hydralazine. Ultram po.   Recheck, pt comfortable, no pain, no cp. No headache.   Bp is improved from prior, 178/100, and does not require further emergent lowering.   Rx for home.   Rec close pcp/cardiology f/u (will refer to card for htn, as pt voices would like another outpatient opinion re bp management).  Pt currently appears stable for d/c.   Return precautions provided.           Final Clinical Impression(s) / ED Diagnoses Final diagnoses:  Essential hypertension  Uncontrolled hypertension    Rx / DC Orders ED Discharge Orders          Ordered    amLODipine (NORVASC) 5 MG tablet  Daily        10/06/22 2136    Ambulatory referral to Cardiology       Comments: If you have not heard from the Cardiology office within the next 72 hours please call [phone].   10/06/22 2136                Cathren Laine, MD 10/06/22 2201

## 2022-10-06 NOTE — Discharge Instructions (Addendum)
It was our pleasure to provide your ER care today - we hope that you feel better.  Also take amlodipine for blood pressure. Limit salt intake, follow heart health meal plan.   Follow up closely with primary care doctor/cardiologist in the coming week.   Return to ER if worse, new symptoms, chest pain, trouble breathing, or other emergency concern.   You were given pain meds in the ER  - no driving for the next 6 hours.

## 2022-10-06 NOTE — Patient Instructions (Signed)
  Denise Khan, thank you for joining Bennie Pierini, FNP for today's virtual visit.  While this provider is not your primary care provider (PCP), if your PCP is located in our provider database this encounter information will be shared with them immediately following your visit.   A [facility] MyChart account gives you access to today's visit and all your visits, tests, and labs performed at [facility] " click here if you don't have a [facility] MyChart account or go to [email or web address]  Consent: (Patient) Denise Khan provided verbal consent for this virtual visit at the beginning of the encounter.  Current Medications:  Current Outpatient Medications:    diclofenac Sodium (VOLTAREN) 1 % GEL, Apply 4 g topically 4 (four) times daily., Disp: 4 g, Rfl: 3   etonogestrel (NEXPLANON) 68 MG IMPL implant, 1 each by Subdermal route once., Disp: , Rfl:    lidocaine (XYLOCAINE) 2 % solution, Use as directed 5 mLs in the mouth or throat every 6 (six) hours as needed for mouth pain. Gargle and spit 5 mL every 6 hours as needed for severe throat pain or discomfort., Disp: 100 mL, Rfl: 0   oseltamivir (TAMIFLU) 75 MG capsule, Take 1 capsule (75 mg total) by mouth every 12 (twelve) hours., Disp: 10 capsule, Rfl: 0   rivaroxaban (XARELTO) 20 MG TABS tablet, Take 20 mg by mouth daily with supper., Disp: , Rfl:    sertraline (ZOLOFT) 25 MG tablet, Take 1 tablet (25 mg total) by mouth daily., Disp: 30 tablet, Rfl: 2   telmisartan (MICARDIS) 40 MG tablet, Take 1 tablet (40 mg total) by mouth daily., Disp: 30 tablet, Rfl: 1   Vitamin D, Ergocalciferol, (DRISDOL) 1.25 MG (50000 UNIT) CAPS capsule, Take 1 capsule (50,000 Units total) by mouth every 7 (seven) days., Disp: 5 capsule, Rfl: 2   Medications ordered in this encounter:  No orders of the defined types were placed in this encounter.    *If you need refills on other medications prior to your next appointment, please  contact your pharmacy*  Follow-Up: Call back or seek an in-person evaluation if the symptoms worsen or if the condition fails to improve as anticipated.  [facility] [phone]  Other Instructions To ED   If you have been instructed to have an in-person evaluation today at a local Urgent Care facility, please use the link below. It will take you to a list of all of our available [facility], including address, phone number and hours of operation. Please do not delay care.  [facility]  If you or a family member do not have a primary care provider, use the link below to schedule a visit and establish care. When you choose a [facility] primary care physician or advanced practice provider, you gain a long-term partner in health. Find a Primary Care Provider  Learn more about [facility]'s in-office and virtual care options:  - Get Care Now

## 2022-10-06 NOTE — ED Triage Notes (Signed)
Pt states blood pressure has been high for a couple weeks. pt saw pcp 1 week ago and was told to keep taking medication as prescribed and follow up in 2 weeks. Today pt has a throbbing headache, dizziness and bp has gotten as high as 204/134.

## 2022-10-06 NOTE — ED Notes (Signed)
Pt care taken, here because her blood pressure has been going up steadily for the last 2 weeks despite her blood pressure meds. Awaiting md

## 2022-10-08 ENCOUNTER — Other Ambulatory Visit: Payer: Self-pay | Admitting: Family Medicine

## 2022-10-09 ENCOUNTER — Telehealth: Payer: Self-pay

## 2022-10-09 NOTE — Transitions of Care (Post Inpatient/ED Visit) (Signed)
   10/09/2022  Name: Denise Khan MRN: [id number] DOB: June 15, 1986  Today's TOC FU Call Status: Today's TOC FU Call Status:: Unsuccessul Call (1st Attempt) Unsuccessful Call (1st Attempt) Date: 10/09/22  Attempted to reach the patient regarding the most recent Inpatient/ED visit.  Follow Up Plan: Additional outreach attempts will be made to reach the patient to complete the Transitions of Care (Post Inpatient/ED visit) call.   Signature  Each day, aim for 6 glasses of water, plenty of protein in your diet and try to get up and walk/ stretch every hour for 5-10 minutes at a time.

## 2022-10-12 ENCOUNTER — Encounter: Payer: Self-pay | Admitting: Family Medicine

## 2022-10-12 ENCOUNTER — Ambulatory Visit: Payer: BC Managed Care – PPO | Admitting: Family Medicine

## 2022-10-12 VITALS — BP 140/86 | HR 76 | Ht 70.0 in | Wt 237.0 lb

## 2022-10-12 DIAGNOSIS — I10 Essential (primary) hypertension: Secondary | ICD-10-CM | POA: Diagnosis not present

## 2022-10-12 MED ORDER — TELMISARTAN-AMLODIPINE 40-10 MG PO TABS
1.0000 | ORAL_TABLET | Freq: Every day | ORAL | 1 refills | Status: DC
Start: 2022-10-12 — End: 2022-11-12

## 2022-10-12 NOTE — Patient Instructions (Addendum)
I appreciate the opportunity to provide care to you today!    Follow up:  1 month for BP  Please pick up your prescription at the pharmacy and start therapy I recommend taking each dose at the same time each day, with or without flu High blood pressure often has no symptoms; it is important to take this medication as often as directed for optimal benefit In the first few days of therapy, you may experience dizziness/lightheadedness from your body adjusting to lower pressure (this is expected) I recommend checking your blood pressure daily and bringing your readings with you at your next visit I want your blood pressure to be less than 140/90 I recommend a low-sodium diet with increased physical activity I recommend increasing your fluid consumption by at least 64 ounces daily I recommend a daily sodium intake of less than 1500 mg     Please continue to a heart-healthy diet and increase your physical activities. Try to exercise for at least five days a week.      It was a pleasure to see you and I look forward to continuing to work together on your health and well-being. Please do not hesitate to call the office if you need care or have questions about your care.   Have a wonderful day and week. With Gratitude, Gilmore Laroche MSN, FNP-BC

## 2022-10-12 NOTE — Progress Notes (Unsigned)
Established Patient Office Visit  Subjective:  Patient ID: Denise Khan, female    DOB: 10/28/1985  Age: 37 y.o. MRN: [id number]  CC:  Chief Complaint  Patient presents with   Follow-up    2 week f/u pt reports going to ED on 10/06/22 for this, felt dizzy.    HPI Denise Khan is a 37 y.o. female with past medical history of hypertension presents for BP follow up. For the details of today's visit, please refer to the assessment and plan.      Past Medical History:  Diagnosis Date   Anxiety    Blood transfusion without reported diagnosis    Breech presentation 06/02/2020   Cancer    Cesarean delivery delivered 06/02/2020   Clotting disorder    Hypertension    PE (pulmonary embolism)     Past Surgical History:  Procedure Laterality Date   CESAREAN SECTION  06/02/2020   Procedure: CESAREAN SECTION;  Surgeon: Reva Bores, MD;  Location: [facility] LD [facility]S;  Service: Obstetrics;;   KNEE SURGERY     PORT-A-CATH REMOVAL      Family History  Problem Relation Age of Onset   CAD Father    Hypertension Father    Heart disease Father    Hypertension Mother    Cancer Maternal Grandmother        lung   Stroke Maternal Grandfather    Hypertension Maternal Grandfather    Kidney disease Maternal Grandfather    Hernia Son     Social History   Socioeconomic History   Marital status: Significant Other    Spouse name: Not on file   Number of children: 2   Years of education: 16   Highest education level: Not on file  Occupational History   Occupation: Software engineer    Comment: Runner, 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  Tobacco Use   Smoking status: Never   Smokeless tobacco: Never  Vaping Use   Vaping Use: Never used  Substance and Sexual Activity   Alcohol use: Yes    Comment: 1-2 drinks per month   Drug use: Not Currently   Sexual activity: Yes    Birth control/protection: Implant    Comment: Nexplanon  Other Topics Concern   Not on file  Social History Narrative   Biology  teacher   Lives with 68 yo son Trompeter; has infant son as well   His dad is Fayrene Fearing, lives with them   Social Determinants of Health   Financial Resource Strain: Low Risk  (12/07/2021)   Overall Financial Resource Strain (CARDIA)    Difficulty of Paying Living Expenses: Not very hard  Food Insecurity: No Food Insecurity (12/07/2021)   Hunger Vital Sign    Worried About Running Out of Food in the Last Year: Never true    Ran Out of Food in the Last Year: Never true  Transportation Needs: No Transportation Needs (12/07/2021)   PRAPARE - Administrator, Civil Service (Medical): No    Lack of Transportation (Non-Medical): No  Physical Activity: Inactive (12/07/2021)   Exercise Vital Sign    Days of Exercise per Week: 0 days    Minutes of Exercise per Session: 0 min  Stress: No Stress Concern Present (12/07/2021)   [employer] of Occupational Health - Occupational Stress Questionnaire    Feeling of Stress : Only a little  Social Connections: Moderately Isolated (12/07/2021)   Social Connection and Isolation Panel [NHANES]    Frequency of Communication with Friends  and Family: More than three times a week    Frequency of Social Gatherings with Friends and Family: Once a week    Attends Religious Services: Never    Database administrator or Organizations: No    Attends Banker Meetings: Never    Marital Status: Living with partner  Intimate Partner Violence: Not At Risk (12/07/2021)   Humiliation, Afraid, Rape, and Kick questionnaire    Fear of Current or Ex-Partner: No    Emotionally Abused: No    Physically Abused: No    Sexually Abused: No    Outpatient Medications Prior to Visit  Medication Sig Dispense Refill   diclofenac Sodium (VOLTAREN) 1 % GEL Apply 4 g topically 4 (four) times daily. 4 g 3   etonogestrel (NEXPLANON) 68 MG IMPL implant 1 each by Subdermal route once.     lidocaine (XYLOCAINE) 2 % solution Use as directed 5 mLs in the mouth or throat every 6  (six) hours as needed for mouth pain. Gargle and spit 5 mL every 6 hours as needed for severe throat pain or discomfort. 100 mL 0   oseltamivir (TAMIFLU) 75 MG capsule Take 1 capsule (75 mg total) by mouth every 12 (twelve) hours. 10 capsule 0   rivaroxaban (XARELTO) 20 MG TABS tablet Take 20 mg by mouth daily with supper.     sertraline (ZOLOFT) 25 MG tablet Take 1 tablet (25 mg total) by mouth daily. 30 tablet 2   Vitamin D, Ergocalciferol, (DRISDOL) 1.25 MG (50000 UNIT) CAPS capsule Take 1 capsule (50,000 Units total) by mouth every 7 (seven) days. 5 capsule 2   amLODipine (NORVASC) 5 MG tablet Take 1 tablet (5 mg total) by mouth daily. 30 tablet 0   telmisartan (MICARDIS) 40 MG tablet Take 1 tablet (40 mg total) by mouth daily. 30 tablet 1   No facility-administered medications prior to visit.    No Known Allergies  ROS Review of Systems  Constitutional:  Negative for chills and fever.  Eyes:  Negative for visual disturbance.  Respiratory:  Negative for chest tightness and shortness of breath.   Neurological:  Negative for dizziness and headaches.      Objective:    Physical Exam HENT:     Head: Normocephalic.     Mouth/Throat:     Mouth: Mucous membranes are moist.  Cardiovascular:     Rate and Rhythm: Normal rate.     Heart sounds: Normal heart sounds.  Pulmonary:     Effort: Pulmonary effort is normal.     Breath sounds: Normal breath sounds.  Neurological:     Mental Status: She is alert.     BP (!) 140/86 (BP Location: Left Arm)   Pulse 76   Ht  (1.778 m)   Wt 237 lb 0.6 oz (107.5 kg)   LMP 09/13/2022 (Approximate)   SpO2 98%   BMI 34.01 kg/m  Wt Readings from Last 3 Encounters:  10/12/22 237 lb 0.6 oz (107.5 kg)  10/06/22 246 lb 14.6 oz (112 kg)  09/27/22 248 lb 0.6 oz (112.5 kg)    Lab Results  Component Value Date   TSH 1.330 04/20/2022   Lab Results  Component Value Date   WBC 7.2 10/06/2022   HGB 14.2 10/06/2022   HCT 41.4 10/06/2022    MCV 94.1 10/06/2022   PLT 248 10/06/2022   Lab Results  Component Value Date   NA 135 10/06/2022   K 3.8 10/06/2022   CO2 24  10/06/2022   GLUCOSE 101 (H) 10/06/2022   BUN 8 10/06/2022   CREATININE 0.89 10/06/2022   BILITOT 0.6 04/20/2022   ALKPHOS 52 04/20/2022   AST 17 04/20/2022   ALT 17 04/20/2022   PROT 6.6 04/20/2022   ALBUMIN 4.4 04/20/2022   CALCIUM 8.8 (L) 10/06/2022   ANIONGAP 9 10/06/2022   EGFR 107 09/27/2022   Lab Results  Component Value Date   CHOL 136 04/20/2022   Lab Results  Component Value Date   HDL 46 04/20/2022   Lab Results  Component Value Date   LDLCALC 81 04/20/2022   Lab Results  Component Value Date   TRIG 38 04/20/2022   Lab Results  Component Value Date   CHOLHDL 3.0 04/20/2022   Lab Results  Component Value Date   HGBA1C 5.4 04/20/2022      Assessment & Plan:  Primary hypertension Assessment & Plan: Uncontrolled She takes telmisartan 40 mg and amlodipine 5 mg She was started on amlodipine 5 mg in the ED for elevated BP on 10/06/2022  Encouraged a low-sodium diet with increased physical activity BP Readings from Last 3 Encounters:  10/12/22 (!) 140/86  10/06/22 (!) 157/109  09/27/22 (!) 148/102     Orders: -     Telmisartan-amLODIPine; Take 1 tablet by mouth daily at 2 PM.  Dispense: 30 tablet; Refill: 1    Follow-up: Return in about 1 month (around 11/11/2022) for BP.   Gilmore Laroche, FNP

## 2022-10-13 NOTE — Assessment & Plan Note (Addendum)
Uncontrolled She takes telmisartan 40 mg and amlodipine 5 mg She was started on amlodipine 5 mg in the ED for elevated BP on 10/06/2022  Encouraged a low-sodium diet with increased physical activity BP Readings from Last 3 Encounters:  10/12/22 (!) 140/86  10/06/22 (!) 157/109  09/27/22 (!) 148/102

## 2022-11-06 ENCOUNTER — Encounter: Payer: Self-pay | Admitting: Internal Medicine

## 2022-11-06 ENCOUNTER — Telehealth: Payer: Self-pay | Admitting: Internal Medicine

## 2022-11-06 ENCOUNTER — Ambulatory Visit: Payer: BC Managed Care – PPO | Attending: Internal Medicine | Admitting: Internal Medicine

## 2022-11-06 VITALS — BP 138/92 | HR 62 | Ht 70.0 in | Wt 245.0 lb

## 2022-11-06 DIAGNOSIS — Z79899 Other long term (current) drug therapy: Secondary | ICD-10-CM | POA: Diagnosis not present

## 2022-11-06 DIAGNOSIS — I10 Essential (primary) hypertension: Secondary | ICD-10-CM | POA: Diagnosis not present

## 2022-11-06 MED ORDER — CHLORTHALIDONE 25 MG PO TABS: 25.0000 mg | ORAL_TABLET | Freq: Every day | ORAL | 1 refills | Status: DC

## 2022-11-06 NOTE — Patient Instructions (Addendum)
Medication Instructions:  Your physician has recommended you make the following change in your medication:  Start chlorthalidone 25 mg daily Continue other medications the same  Labwork: BMET (11/13/2022) @[facility] ([address] [place]) Non-fasting  Testing/Procedures: Your physician has requested that you have an echocardiogram. Echocardiography is a painless test that uses sound waves to create images of your heart. It provides your doctor with information about the size and shape of your heart and how well your heart's chambers and valves are working. This procedure takes approximately one hour. There are no restrictions for this procedure. Please do NOT wear cologne, perfume, aftershave, or lotions (deodorant is allowed). Please arrive 15 minutes prior to your appointment time. Your physician has requested that you have a renal artery duplex. During this test, an ultrasound is used to evaluate blood flow to the kidneys. Allow one hour for this exam. Do not eat after midnight the day before and avoid carbonated beverages. Take your medications as you usually do.  Follow-Up: Your physician recommends that you schedule a follow-up appointment in: 6 months  Any Other Special Instructions Will Be Listed Below (If Applicable).  If you need a refill on your cardiac medications before your next appointment, please call your pharmacy.

## 2022-11-06 NOTE — Telephone Encounter (Signed)
Checking percert on the following patient for testing scheduled at [facility].    US RENAL  11/21/2022

## 2022-11-06 NOTE — Progress Notes (Signed)
Cardiology Office Note  Date: 11/06/2022   ID: Denise Khan, DOB [birth year], MRN [id number]  PCP:  Gilmore Laroche, FNP  Cardiologist:  Marjo Bicker, MD Electrophysiologist:  None   Reason for Office Visit: HTN evaluation at the request of Dr. Denton Lank   History of Present Illness: Denise Khan is a 37 y.o. female known to have history of PE on AC, HTN (diagnosed 1 year ago), was referred to cardiology clinic for evaluation of HTN.  Patient had ER visit in 10/2022 which prompted cardiology referral. She was initially started on telmisartan 40 mg once daily by PCP after which she had a ER visit with blood pressure as high as 200 mmHg SBP. She was started on amlodipine 5 mg once daily in the ER and was discharged to be seen by PCP and cardiology. When she was seen by the PCP a few days later, she was started on telmisartan-amlodipine 40-10 mg combination medication which decreased her blood pressures adequately. Currently her blood pressures run around 130s mm Hg SBP at home.  No symptoms of angina, DOE, dizziness, syncope, leg swelling.  She was diagnosed with HTN 1 year ago.  Denies smoking cigarettes, alcohol use and illicit drug abuse.  Past Medical History:  Diagnosis Date   Anxiety    Blood transfusion without reported diagnosis    Breech presentation 06/02/2020   Cancer [facility])    Cesarean delivery delivered 06/02/2020   Clotting disorder (HCC)    Hypertension    PE (pulmonary embolism)     Past Surgical History:  Procedure Laterality Date   CESAREAN SECTION  06/02/2020   Procedure: CESAREAN SECTION;  Surgeon: Reva Bores, MD;  Location: [facility] LD [facility]S;  Service: Obstetrics;;   KNEE SURGERY     PORT-A-CATH REMOVAL      Current Outpatient Medications  Medication Sig Dispense Refill   chlorthalidone (HYGROTON) 25 MG tablet Take 1 tablet (25 mg total) by mouth daily. 90 tablet 1   diclofenac Sodium (VOLTAREN) 1 % GEL Apply 4 g topically 4 (four) times daily. 4 g  3   etonogestrel (NEXPLANON) 68 MG IMPL implant 1 each by Subdermal route once.     rivaroxaban (XARELTO) 20 MG TABS tablet Take 20 mg by mouth daily with supper.     Telmisartan-amLODIPine 40-10 MG TABS Take 1 tablet by mouth daily at 2 PM. 30 tablet 1   lidocaine (XYLOCAINE) 2 % solution Use as directed 5 mLs in the mouth or throat every 6 (six) hours as needed for mouth pain. Gargle and spit 5 mL every 6 hours as needed for severe throat pain or discomfort. (Patient not taking: Reported on 11/06/2022) 100 mL 0   No current facility-administered medications for this visit.   Allergies:  Patient has no known allergies.   Social History: The patient  reports that she has never smoked. She has never used smokeless tobacco. She reports current alcohol use. She reports that she does not currently use drugs.   Family History: The patient's family history includes CAD in her father; Cancer in her maternal grandmother; Heart disease in her father; Hernia in her son; Hypertension in her father, maternal grandfather, and mother; Kidney disease in her maternal grandfather; Stroke in her maternal grandfather.   ROS:  Please see the history of present illness. Otherwise, complete review of systems is positive for none  All other systems are reviewed and negative.   Physical Exam: VS:  BP (!) 138/92   Pulse  62   Ht 5\' 10"  (1.778 m)   Wt 245 lb (111.1 kg)   SpO2 98%   BMI 35.15 kg/m , BMI Body mass index is 35.15 kg/m.  Wt Readings from Last 3 Encounters:  11/06/22 245 lb (111.1 kg)  10/12/22 237 lb 0.6 oz (107.5 kg)  10/06/22 246 lb 14.6 oz (112 kg)    General: Patient appears comfortable at rest. HEENT: Conjunctiva and lids normal, oropharynx clear with moist mucosa. Neck: Supple, no elevated JVP or carotid bruits, no thyromegaly. Lungs: Clear to auscultation, nonlabored breathing at rest. Cardiac: Regular rate and rhythm, no S3 or significant systolic murmur, no pericardial rub. Abdomen:  Soft, nontender, no hepatomegaly, bowel sounds present, no guarding or rebound. Extremities: No pitting edema, distal pulses 2+. Skin: Warm and dry. Musculoskeletal: No kyphosis. Neuropsychiatric: Alert and oriented x3, affect grossly appropriate.  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  # HTN -Patient was diagnosed with HTN in 2023 -Continue telmisartan-amlodipine 40-10 mg once daily. Blood pressures remain elevated despite being on this medication (has been ranging around 130/90 mmHg). Will start chlorthalidone 25 mg once daily and obtain BMP in 5 days. -Will evaluate for secondary causes of HTN due to young age, obtain renal artery Doppler ultrasound and 2D echocardiogram. -She does not have any symptoms of OSA, will defer OSA testing as well. -Diet and exercise counseling provided.  # History of PE on AC: Continue rivaroxaban 20 mg daily with supper.  Follow-up with PCP for further management.    I have spent a total of 45 minutes with patient reviewing chart, EKGs, labs and examining patient as well as establishing an assessment and plan that was discussed with the patient.  > 50% of time was spent in direct patient care.    Medication Adjustments/Labs and Tests Ordered: Current medicines are reviewed at length with the patient today.  Concerns regarding medicines are outlined above.   Tests Ordered: Orders Placed This Encounter  Procedures   US Renal   Basic metabolic panel   EKG 12-Lead   ECHOCARDIOGRAM COMPLETE     Medication Changes: Meds ordered this encounter  Medications   chlorthalidone (HYGROTON) 25 MG tablet    Sig: Take 1 tablet (25 mg total) by mouth daily.    Dispense:  90 tablet    Refill:  1    11/06/2022 NEW    Disposition:  Follow up  6 months  Signed Glendell Schlottman Verne Spurr, MD, 11/06/2022 12:39 PM    [facility] at [facility] [address] [place], [place], [place] [address]

## 2022-11-12 ENCOUNTER — Ambulatory Visit: Payer: BC Managed Care – PPO | Admitting: Family Medicine

## 2022-11-12 ENCOUNTER — Encounter: Payer: Self-pay | Admitting: Family Medicine

## 2022-11-12 VITALS — BP 133/74 | HR 77 | Ht 70.0 in | Wt 242.1 lb

## 2022-11-12 DIAGNOSIS — I10 Essential (primary) hypertension: Secondary | ICD-10-CM

## 2022-11-12 MED ORDER — TELMISARTAN-AMLODIPINE 40-10 MG PO TABS
1.0000 | ORAL_TABLET | Freq: Every day | ORAL | 2 refills | Status: DC
Start: 2022-11-12 — End: 2023-05-10

## 2022-11-12 NOTE — Assessment & Plan Note (Signed)
Controlled She takes telmisartan 40 mg-amlodipine 10 mg and was recently started on chlorthalidone 25 mg once daily by her cardiologist The patient is asymptomatic today Encouraged a low-sodium diet with increased physical activity BP Readings from Last 3 Encounters:  11/12/22 133/74  11/06/22 (!) 138/92  10/12/22 (!) 140/86

## 2022-11-12 NOTE — Progress Notes (Signed)
Established Patient Office Visit  Subjective:  Patient ID: Denise Khan, female    DOB: Aug 08, 1985  Age: 37 y.o. MRN: [id number]  CC:  Chief Complaint  Patient presents with   Hypertension    1 month f/u for bp.     HPI Denise Khan is a 37 y.o. female presents for blood pressure follow-up. For the details of today's visit, please refer to the assessment and plan.    Past Medical History:  Diagnosis Date   Anxiety    Blood transfusion without reported diagnosis    Breech presentation 06/02/2020   Cancer [facility])    Cesarean delivery delivered 06/02/2020   Clotting disorder (HCC)    Hypertension    PE (pulmonary embolism)     Past Surgical History:  Procedure Laterality Date   CESAREAN SECTION  06/02/2020   Procedure: CESAREAN SECTION;  Surgeon: Reva Bores, MD;  Location: [facility];  Service: Obstetrics;;   KNEE SURGERY     PORT-A-CATH REMOVAL      Family History  Problem Relation Age of Onset   CAD Father    Hypertension Father    Heart disease Father    Hypertension Mother    Cancer Maternal Grandmother        lung   Stroke Maternal Grandfather    Hypertension Maternal Grandfather    Kidney disease Maternal Grandfather    Hernia Son     Social History   Socioeconomic History   Marital status: Significant Other    Spouse name: Not on file   Number of children: 2   Years of education: 16   Highest education level: Not on file  Occupational History   Occupation: Software engineer    Comment: Runner, 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  Tobacco Use   Smoking status: Never   Smokeless tobacco: Never  Vaping Use   Vaping Use: Never used  Substance and Sexual Activity   Alcohol use: Yes    Comment: 1-2 drinks per month   Drug use: Not Currently   Sexual activity: Yes    Birth control/protection: Implant    Comment: Nexplanon  Other Topics Concern   Not on file  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 with 28 yo son Hackel; has infant son as well    His dad is Fayrene Fearing, lives with them   Social Determinants of Health   Financial Resource Strain: Low Risk  (12/07/2021)   Overall Financial Resource Strain (CARDIA)    Difficulty of Paying Living Expenses: Not very hard  Food Insecurity: No Food Insecurity (12/07/2021)   Hunger Vital Sign    Worried About Running Out of Food in the Last Year: Never true    Ran Out of Food in the Last Year: Never true  Transportation Needs: No Transportation Needs (12/07/2021)   PRAPARE - Administrator, Civil Service (Medical): No    Lack of Transportation (Non-Medical): No  Physical Activity: Inactive (12/07/2021)   Exercise Vital Sign    Days of Exercise per Week: 0 days    Minutes of Exercise per Session: 0 min  Stress: No Stress Concern Present (12/07/2021)   [employer] of Occupational Health - Occupational Stress Questionnaire    Feeling of Stress : Only a little  Social Connections: Moderately Isolated (12/07/2021)   Social Connection and Isolation Panel [NHANES]    Frequency of Communication with Friends and Family: More than three times a week    Frequency of Social  Gatherings with Friends and Family: Once a week    Attends Religious Services: Never    Database administrator or Organizations: No    Attends Banker Meetings: Never    Marital Status: Living with partner  Intimate Partner Violence: Not At Risk (12/07/2021)   Humiliation, Afraid, Rape, and Kick questionnaire    Fear of Current or Ex-Partner: No    Emotionally Abused: No    Physically Abused: No    Sexually Abused: No    Outpatient Medications Prior to Visit  Medication Sig Dispense Refill   chlorthalidone (HYGROTON) 25 MG tablet Take 1 tablet (25 mg total) by mouth daily. 90 tablet 1   diclofenac Sodium (VOLTAREN) 1 % GEL Apply 4 g topically 4 (four) times daily. 4 g 3   etonogestrel (NEXPLANON) 68 MG IMPL implant 1 each by Subdermal route once.     lidocaine (XYLOCAINE) 2 % solution Use as directed 5 mLs  in the mouth or throat every 6 (six) hours as needed for mouth pain. Gargle and spit 5 mL every 6 hours as needed for severe throat pain or discomfort. 100 mL 0   rivaroxaban (XARELTO) 20 MG TABS tablet Take 20 mg by mouth daily with supper.     Telmisartan-amLODIPine 40-10 MG TABS Take 1 tablet by mouth daily at 2 PM. 30 tablet 1   No facility-administered medications prior to visit.    No Known Allergies  ROS Review of Systems  Constitutional:  Negative for chills and fever.  Eyes:  Negative for visual disturbance.  Respiratory:  Negative for chest tightness and shortness of breath.   Neurological:  Negative for dizziness and headaches.      Objective:    Physical Exam HENT:     Head: Normocephalic.     Mouth/Throat:     Mouth: Mucous membranes are moist.  Cardiovascular:     Rate and Rhythm: Normal rate.     Heart sounds: Normal heart sounds.  Pulmonary:     Effort: Pulmonary effort is normal.     Breath sounds: Normal breath sounds.  Neurological:     Mental Status: She is alert.     BP 133/74   Pulse 77   Ht 5\' 10"  (1.778 m)   Wt 242 lb 1.3 oz (109.8 kg)   SpO2 97%   BMI 34.73 kg/m  Wt Readings from Last 3 Encounters:  11/12/22 242 lb 1.3 oz (109.8 kg)  11/06/22 245 lb (111.1 kg)  10/12/22 237 lb 0.6 oz (107.5 kg)    Lab Results  Component Value Date   TSH 1.330 04/20/2022   Lab Results  Component Value Date   WBC 7.2 10/06/2022   HGB 14.2 10/06/2022   HCT 41.4 10/06/2022   MCV 94.1 10/06/2022   PLT 248 10/06/2022   Lab Results  Component Value Date   NA 135 10/06/2022   K 3.8 10/06/2022   CO2 24 10/06/2022   GLUCOSE 101 (H) 10/06/2022   BUN 8 10/06/2022   CREATININE 0.89 10/06/2022   BILITOT 0.6 04/20/2022   ALKPHOS 52 04/20/2022   AST 17 04/20/2022   ALT 17 04/20/2022   PROT 6.6 04/20/2022   ALBUMIN 4.4 04/20/2022   CALCIUM 8.8 (L) 10/06/2022   ANIONGAP 9 10/06/2022   EGFR 107 09/27/2022   Lab Results  Component Value Date   CHOL  136 04/20/2022   Lab Results  Component Value Date   HDL 46 04/20/2022   Lab Results  Component  Value Date   LDLCALC 81 04/20/2022   Lab Results  Component Value Date   TRIG 38 04/20/2022   Lab Results  Component Value Date   CHOLHDL 3.0 04/20/2022   Lab Results  Component Value Date   HGBA1C 5.4 04/20/2022      Assessment & Plan:  Primary hypertension Assessment & Plan: Controlled She takes telmisartan 40 mg-amlodipine 10 mg and was recently started on chlorthalidone 25 mg once daily by her cardiologist The patient is asymptomatic today Encouraged a low-sodium diet with increased physical activity BP Readings from Last 3 Encounters:  11/12/22 133/74  11/06/22 (!) 138/92  10/12/22 (!) 140/86     Orders: -     Telmisartan-amLODIPine; Take 1 tablet by mouth daily at 2 PM.  Dispense: 30 tablet; Refill: 2 -     BMP8+EGFR    Follow-up: Return in about 3 months (around 02/12/2023).   Gilmore Laroche, FNP

## 2022-11-12 NOTE — Patient Instructions (Addendum)
I appreciate the opportunity to provide care to you today!    Follow up:  3 months  Labs: please stop by the lab today to get your blood drawn (BMP)     Please continue to a heart-healthy diet and increase your physical activities. Try to exercise for 30mins at least five days a week.      It was a pleasure to see you and I look forward to continuing to work together on your health and well-being. Please do not hesitate to call the office if you need care or have questions about your care.   Have a wonderful day and week. With Gratitude, Biran Mayberry MSN, FNP-BC  

## 2022-11-13 LAB — BMP8+EGFR
BUN/Creatinine Ratio: 13 (ref 9–23)
BUN: 14 mg/dL (ref 6–20)
CO2: 22 mmol/L (ref 20–29)
Calcium: 9.2 mg/dL (ref 8.7–10.2)
Chloride: 107 mmol/L — ABNORMAL HIGH (ref 96–106)
Creatinine, Ser: 1.05 mg/dL — ABNORMAL HIGH (ref 0.57–1.00)
Glucose: 101 mg/dL — ABNORMAL HIGH (ref 70–99)
Potassium: 4.2 mmol/L (ref 3.5–5.2)
Sodium: 141 mmol/L (ref 134–144)
eGFR: 70 mL/min/{1.73_m2} (ref >59)

## 2022-11-13 NOTE — Progress Notes (Signed)
Please encourage the patient to increase her fluid consumption, at least 64 ounces daily.  Her potassium and kidney levels are stable

## 2022-11-21 ENCOUNTER — Ambulatory Visit (HOSPITAL_COMMUNITY): Payer: BC Managed Care – PPO

## 2022-11-23 ENCOUNTER — Other Ambulatory Visit: Payer: Self-pay | Admitting: Family Medicine

## 2022-11-23 ENCOUNTER — Encounter: Payer: Self-pay | Admitting: Family Medicine

## 2022-11-29 ENCOUNTER — Ambulatory Visit (HOSPITAL_COMMUNITY)
Admission: RE | Admit: 2022-11-29 | Discharge: 2022-11-29 | Disposition: A | Discharge status: Home / Self Care | Payer: BC Managed Care – PPO | Source: Ambulatory Visit | Attending: Internal Medicine | Admitting: Internal Medicine

## 2022-11-29 DIAGNOSIS — I10 Essential (primary) hypertension: Secondary | ICD-10-CM | POA: Insufficient documentation

## 2022-12-07 ENCOUNTER — Telehealth: Payer: Self-pay | Admitting: *Deleted

## 2022-12-07 DIAGNOSIS — I10 Essential (primary) hypertension: Secondary | ICD-10-CM

## 2022-12-07 NOTE — Telephone Encounter (Signed)
-----   Message from Marjo Bicker, MD sent at 12/02/2022  7:13 PM EDT ----- This test was supposed to be Renal Duplex to rule out renal artery stenosis. Please obtain Renal Doppler.

## 2022-12-07 NOTE — Telephone Encounter (Signed)
Patient informed and verbalized understanding of plan. 

## 2022-12-18 ENCOUNTER — Ambulatory Visit (HOSPITAL_COMMUNITY)
Admission: RE | Admit: 2022-12-18 | Discharge: 2022-12-18 | Disposition: A | Discharge status: Home / Self Care | Payer: BC Managed Care – PPO | Source: Ambulatory Visit | Attending: Internal Medicine | Admitting: Internal Medicine

## 2022-12-18 DIAGNOSIS — I10 Essential (primary) hypertension: Secondary | ICD-10-CM | POA: Insufficient documentation

## 2022-12-18 LAB — ECHOCARDIOGRAM COMPLETE
Area-P 1/2: 3.27 cm2
S' Lateral: 3.2 cm

## 2022-12-18 NOTE — Progress Notes (Signed)
*  PRELIMINARY RESULTS* Echocardiogram 2D Echocardiogram has been performed.  Stacey Drain 12/18/2022, 11:07 AM

## 2023-02-12 ENCOUNTER — Encounter: Payer: Self-pay | Admitting: Family Medicine

## 2023-02-12 ENCOUNTER — Ambulatory Visit: Payer: BC Managed Care – PPO | Admitting: Family Medicine

## 2023-02-12 VITALS — BP 113/73 | HR 105 | Ht 70.0 in | Wt 243.1 lb

## 2023-02-12 DIAGNOSIS — E7849 Other hyperlipidemia: Secondary | ICD-10-CM

## 2023-02-12 DIAGNOSIS — E038 Other specified hypothyroidism: Secondary | ICD-10-CM

## 2023-02-12 DIAGNOSIS — I10 Essential (primary) hypertension: Secondary | ICD-10-CM

## 2023-02-12 DIAGNOSIS — R7301 Impaired fasting glucose: Secondary | ICD-10-CM

## 2023-02-12 DIAGNOSIS — E559 Vitamin D deficiency, unspecified: Secondary | ICD-10-CM

## 2023-02-12 NOTE — Progress Notes (Signed)
Established Patient Office Visit  Subjective:  Patient ID: Denise Khan, female    DOB: 1986-03-23  Age: 37 y.o. MRN: [id number]  CC:  Chief Complaint  Patient presents with   Care Management    3 month follow up    HPI Denise Khan is a 37 y.o. female with past medical history of htn presents for f/u of  chronic medical conditions. For the details of today's visit, please refer to the assessment and plan.     Past Medical History:  Diagnosis Date   Anxiety    Blood transfusion without reported diagnosis    Breech presentation 06/02/2020   Cancer [facility])    Cesarean delivery delivered 06/02/2020   Clotting disorder (HCC)    Hypertension    PE (pulmonary embolism)     Past Surgical History:  Procedure Laterality Date   CESAREAN SECTION  06/02/2020   Procedure: CESAREAN SECTION;  Surgeon: Reva Bores, MD;  Location: [facility] LD [facility]S;  Service: Obstetrics;;   KNEE SURGERY     PORT-A-CATH REMOVAL      Family History  Problem Relation Age of Onset   CAD Father    Hypertension Father    Heart disease Father    Hypertension Mother    Cancer Maternal Grandmother        lung   Stroke Maternal Grandfather    Hypertension Maternal Grandfather    Kidney disease Maternal Grandfather    Hernia Son     Social History   Socioeconomic History   Marital status: Significant Other    Spouse name: Not on file   Number of children: 2   Years of education: 16   Highest education level: Not on file  Occupational History   Occupation: Software engineer    Comment: Runner, 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  Tobacco Use   Smoking status: Never   Smokeless tobacco: Never  Vaping Use   Vaping status: Never Used  Substance and Sexual Activity   Alcohol use: Yes    Comment: 1-2 drinks per month   Drug use: Not Currently   Sexual activity: Yes    Birth control/protection: Implant    Comment: Nexplanon  Other Topics Concern   Not on file  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  with 20 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 Winrow; has infant son as well   His dad is Denise Khan, lives with them   Social Determinants of Health   Financial Resource Strain: Low Risk  (12/07/2021)   Overall Financial Resource Strain (CARDIA)    Difficulty of Paying Living Expenses: Not very hard  Food Insecurity: No Food Insecurity (12/07/2021)   Hunger Vital Sign    Worried About Running Out of Food in the Last Year: Never true    Ran Out of Food in the Last Year: Never true  Transportation Needs: No Transportation Needs (12/07/2021)   PRAPARE - Administrator, Civil Service (Medical): No    Lack of Transportation (Non-Medical): No  Physical Activity: Inactive (12/07/2021)   Exercise Vital Sign    Days of Exercise per Week: 0 days    Minutes of Exercise per Session: 0 min  Stress: No Stress Concern Present (12/07/2021)   [employer] of Occupational Health - Occupational Stress Questionnaire    Feeling of Stress : Only a little  Social Connections: Moderately Isolated (12/07/2021)   Social Connection and Isolation Panel [NHANES]    Frequency of Communication with Friends and Family: More than three  times a week    Frequency of Social Gatherings with Friends and Family: Once a week    Attends Religious Services: Never    Database administrator or Organizations: No    Attends Banker Meetings: Never    Marital Status: Living with partner  Intimate Partner Violence: Not At Risk (12/07/2021)   Humiliation, Afraid, Rape, and Kick questionnaire    Fear of Current or Ex-Partner: No    Emotionally Abused: No    Physically Abused: No    Sexually Abused: No    Outpatient Medications Prior to Visit  Medication Sig Dispense Refill   chlorthalidone (HYGROTON) 25 MG tablet Take 1 tablet (25 mg total) by mouth daily. 90 tablet 1   etonogestrel (NEXPLANON) 68 MG IMPL implant 1 each by Subdermal route once.     rivaroxaban (XARELTO) 20 MG TABS tablet Take 20 mg by mouth daily with supper.     diclofenac  Sodium (VOLTAREN) 1 % GEL Apply 4 g topically 4 (four) times daily. 4 g 3   lidocaine (XYLOCAINE) 2 % solution Use as directed 5 mLs in the mouth or throat every 6 (six) hours as needed for mouth pain. Gargle and spit 5 mL every 6 hours as needed for severe throat pain or discomfort. 100 mL 0   Telmisartan-amLODIPine 40-10 MG TABS Take 1 tablet by mouth daily at 2 PM. 30 tablet 2   No facility-administered medications prior to visit.    No Known Allergies  ROS Review of Systems  Constitutional:  Negative for chills and fever.  Eyes:  Negative for visual disturbance.  Respiratory:  Negative for chest tightness and shortness of breath.   Neurological:  Negative for dizziness and headaches.      Objective:    Physical Exam HENT:     Head: Normocephalic.     Mouth/Throat:     Mouth: Mucous membranes are moist.  Cardiovascular:     Rate and Rhythm: Normal rate.     Heart sounds: Normal heart sounds.  Pulmonary:     Effort: Pulmonary effort is normal.     Breath sounds: Normal breath sounds.  Neurological:     Mental Status: She is alert.     BP 113/73   Pulse (!) 105   Ht 5\' 10"  (1.778 m)   Wt 243 lb 1.9 oz (110.3 kg)   SpO2 98%   BMI 34.88 kg/m  Wt Readings from Last 3 Encounters:  02/12/23 243 lb 1.9 oz (110.3 kg)  11/12/22 242 lb 1.3 oz (109.8 kg)  11/06/22 245 lb (111.1 kg)    Lab Results  Component Value Date   TSH 1.230 02/12/2023   Lab Results  Component Value Date   WBC 5.1 02/12/2023   HGB 14.3 02/12/2023   HCT 41.8 02/12/2023   MCV 95 02/12/2023   PLT 298 02/12/2023   Lab Results  Component Value Date   NA 139 02/12/2023   K 3.8 02/12/2023   CO2 25 02/12/2023   GLUCOSE 99 02/12/2023   BUN 13 02/12/2023   CREATININE 0.93 02/12/2023   BILITOT 0.5 02/12/2023   ALKPHOS 69 02/12/2023   AST 17 02/12/2023   ALT 17 02/12/2023   PROT 7.2 02/12/2023   ALBUMIN 4.5 02/12/2023   CALCIUM 10.0 02/12/2023   ANIONGAP 9 10/06/2022   EGFR 81 02/12/2023    Lab Results  Component Value Date   CHOL 162 02/12/2023   Lab Results  Component Value Date   HDL  42 02/12/2023   Lab Results  Component Value Date   LDLCALC 104 (H) 02/12/2023   Lab Results  Component Value Date   TRIG 82 02/12/2023   Lab Results  Component Value Date   CHOLHDL 3.9 02/12/2023   Lab Results  Component Value Date   HGBA1C 5.6 02/12/2023      Assessment & Plan:  Primary hypertension Assessment & Plan: Controlled She takes telmisartan 40 mg-amlodipine 10 mg and chlorthalidone 25 mg once daily  The patient is asymptomatic today Encouraged a low-sodium diet with increased physical activity BP Readings from Last 3 Encounters:  02/12/23 113/73  11/12/22 133/74  11/06/22 (!) 138/92      IFG (impaired fasting glucose) -     Hemoglobin A1c  Vitamin D deficiency -     VITAMIN D 25 Hydroxy (Vit-D Deficiency, Fractures)  Other specified hypothyroidism -     TSH + free T4  Other hyperlipidemia -     Lipid panel -     CMP14+EGFR -     CBC with Differential/Platelet  Note: This chart has been completed using Engineer, civil (consulting) software, and while attempts have been made to ensure accuracy, certain words and phrases may not be transcribed as intended.    Follow-up: Return in about 4 months (around 06/14/2023).   Gilmore Laroche, FNP

## 2023-02-12 NOTE — Patient Instructions (Addendum)
I appreciate the opportunity to provide care to you today!    Follow up:  4 months  Labs: please stop by the lab today to get your blood drawn (CBC, CMP, TSH, Lipid profile, HgA1c, Vit D)   Attached with your AVS, you will find valuable resources for self-education. I highly recommend dedicating some time to thoroughly examine them.   Please continue to a heart-healthy diet and increase your physical activities. Try to exercise for 30mins at least five days a week.    It was a pleasure to see you and I look forward to continuing to work together on your health and well-being. Please do not hesitate to call the office if you need care or have questions about your care.  In case of emergency, please visit the Emergency Department for urgent care, or contact our clinic at [phone] to schedule an appointment. We're here to help you!   Have a wonderful day and week. With Gratitude, Gloria Zarwolo MSN, FNP-BC  

## 2023-02-15 NOTE — Assessment & Plan Note (Signed)
Controlled She takes telmisartan 40 mg-amlodipine 10 mg and chlorthalidone 25 mg once daily  The patient is asymptomatic today Encouraged a low-sodium diet with increased physical activity BP Readings from Last 3 Encounters:  02/12/23 113/73  11/12/22 133/74  11/06/22 (!) 138/92

## 2023-02-18 ENCOUNTER — Other Ambulatory Visit: Payer: Self-pay | Admitting: Family Medicine

## 2023-02-18 DIAGNOSIS — E559 Vitamin D deficiency, unspecified: Secondary | ICD-10-CM

## 2023-02-18 MED ORDER — VITAMIN D (ERGOCALCIFEROL) 1.25 MG (50000 UNIT) PO CAPS
50000.0000 [IU] | ORAL_CAPSULE | ORAL | 1 refills | Status: DC
Start: 2023-02-18 — End: 2023-12-09

## 2023-02-18 NOTE — Progress Notes (Unsigned)
The ASCVD Risk score (Arnett DK, et al., 2019) failed to calculate for the following reasons:    The 2019 ASCVD risk score is only valid for ages 40 to 79

## 2023-05-10 ENCOUNTER — Encounter: Payer: Self-pay | Admitting: Internal Medicine

## 2023-05-10 ENCOUNTER — Ambulatory Visit: Payer: BC Managed Care – PPO | Attending: Internal Medicine | Admitting: Internal Medicine

## 2023-05-10 VITALS — BP 98/60 | HR 74 | Ht 70.0 in | Wt 237.0 lb

## 2023-05-10 DIAGNOSIS — I10 Essential (primary) hypertension: Secondary | ICD-10-CM

## 2023-05-10 MED ORDER — TELMISARTAN 40 MG PO TABS: 40.0000 mg | ORAL_TABLET | Freq: Every day | ORAL | 3 refills | Status: DC

## 2023-05-10 NOTE — Patient Instructions (Signed)
Medication Instructions:  Your physician has recommended you make the following change in your medication:   -Discontinue Amlodipine  -Continue Telmisartan and Chlorthalidone   *If you need a refill on your cardiac medications before your next appointment, please call your pharmacy*   Lab Work: None If you have labs (blood work) drawn today and your tests are completely normal, you will receive your results only by: MyChart Message (if you have MyChart) OR A paper copy in the mail If you have any lab test that is abnormal or we need to change your treatment, we will call you to review the results.   Testing/Procedures: Your physician has requested that you have a renal artery duplex. During this test, an ultrasound is used to evaluate blood flow to the kidneys. Allow one hour for this exam. Do not eat after midnight the day before and avoid carbonated beverages. Take your medications as you usually do.    Follow-Up: At [facility], you and your health needs are our priority.  As part of our continuing mission to provide you with exceptional heart care, we have created designated Provider Care Teams.  These Care Teams include your primary Cardiologist (physician) and Advanced Practice Providers (APPs -  Physician Assistants and Nurse Practitioners) who all work together to provide you with the care you need, when you need it.  We recommend signing up for the patient portal called "MyChart".  Sign up information is provided on this After Visit Summary.  MyChart is used to connect with patients for Virtual Visits (Telemedicine).  Patients are able to view lab/test results, encounter notes, upcoming appointments, etc.  Non-urgent messages can be sent to your provider as well.   To learn more about what you can do with MyChart, go to [email or web address].    Your next appointment:   1 year(s)  Provider:   You may see Vishnu P Mallipeddi, MD or one of the following Advanced  Practice Providers on your designated Care Team:   [place], PA-C  Jacolyn Reedy, [place]     Other Instructions

## 2023-05-10 NOTE — Progress Notes (Signed)
Cardiology Office Note  Date: 05/10/2023   ID: Denise Khan, DOB 1985-10-14, MRN [id number]  PCP:  Gilmore Laroche, FNP  Cardiologist:  Marjo Bicker, MD Electrophysiologist:  None    History of Present Illness: Denise Khan is a 37 y.o. female known to have history of PE on AC, HTN (diagnosed 1 year ago) is here for follow-up visit.  Patient had ER visit in 10/2022 which prompted cardiology referral. She was initially started on telmisartan 40 mg once daily by PCP after which she had a ER visit with blood pressure as high as 200 mmHg SBP. She was started on amlodipine 5 mg once daily in the ER and was discharged to be seen by PCP and cardiology. When she was seen by the PCP a few days later, she was started on telmisartan-amlodipine 40-10 mg combination medication which decreased her blood pressures adequately.  Patient has been working out, last around 13 pounds of weight and started to notice her blood pressures around 90s mm Hg SBP at home in the last 1 month and also noticed symptoms of fatigue.  Denies other symptoms of angina, DOE, orthopnea, PND, no OSA symptoms, denies having dizziness, presyncope and syncope.  No palpitations.  Diagnosed with HTN in 2023.  Past Medical History:  Diagnosis Date   Anxiety    Blood transfusion without reported diagnosis    Breech presentation 06/02/2020   Cancer [facility])    Cesarean delivery delivered 06/02/2020   Clotting disorder (HCC)    Hypertension    PE (pulmonary embolism)     Past Surgical History:  Procedure Laterality Date   CESAREAN SECTION  06/02/2020   Procedure: CESAREAN SECTION;  Surgeon: Reva Bores, MD;  Location: [facility];  Service: Obstetrics;;   KNEE SURGERY     PORT-A-CATH REMOVAL      Current Outpatient Medications  Medication Sig Dispense Refill   chlorthalidone (HYGROTON) 25 MG tablet Take 1 tablet (25 mg total) by mouth daily. 90 tablet 1   etonogestrel (NEXPLANON) 68 MG IMPL implant 1 each by  Subdermal route once.     rivaroxaban (XARELTO) 20 MG TABS tablet Take 20 mg by mouth daily with supper.     Telmisartan-amLODIPine 40-10 MG TABS Take 1 tablet by mouth daily at 2 PM. 30 tablet 2   Vitamin D, Ergocalciferol, (DRISDOL) 1.25 MG (50000 UNIT) CAPS capsule Take 1 capsule (50,000 Units total) by mouth every 7 (seven) days. 20 capsule 1   No current facility-administered medications for this visit.   Allergies:  Patient has no known allergies.   Social History: The patient  reports that she has never smoked. She has never used smokeless tobacco. She reports current alcohol use. She reports that she does not currently use drugs.   Family History: The patient's family history includes CAD in her father; Cancer in her maternal grandmother; Heart disease in her father; Hernia in her son; Hypertension in her father, maternal grandfather, and mother; Kidney disease in her maternal grandfather; Stroke in her maternal grandfather.   ROS:  Please see the history of present illness. Otherwise, complete review of systems is positive for none  All other systems are reviewed and negative.   Physical Exam: VS:  BP 98/60   Pulse 74   Ht 5\' 10"  (1.778 m)   Wt 237 lb (107.5 kg)   SpO2 98%   BMI 34.01 kg/m , BMI Body mass index is 34.01 kg/m.  Wt Readings from Last 3  Encounters:  05/10/23 237 lb (107.5 kg)  02/12/23 243 lb 1.9 oz (110.3 kg)  11/12/22 242 lb 1.3 oz (109.8 kg)    General: Patient appears comfortable at rest. HEENT: Conjunctiva and lids normal, oropharynx clear with moist mucosa. Neck: Supple, no elevated JVP or carotid bruits, no thyromegaly. Lungs: Clear to auscultation, nonlabored breathing at rest. Cardiac: Regular rate and rhythm, no S3 or significant systolic murmur, no pericardial rub. Abdomen: Soft, nontender, no hepatomegaly, bowel sounds present, no guarding or rebound. Extremities: No pitting edema, distal pulses 2+. Skin: Warm and dry. Musculoskeletal: No  kyphosis. Neuropsychiatric: Alert and oriented x3, affect grossly appropriate.  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  Will discontinue amlodipine, continue telmisartan 40 mg once daily and chlorthalidone 25 mg once daily.  Encourage patient to lose more weight.  She is motivated. Obtain renal artery Doppler ultrasound.  Echocardiogram showed normal LVEF and no valvular heart disease.  No OSA symptoms. -On rivaroxaban 20 mg daily with supper for PE management, follows with PCP.   I spent a total duration of 30 minutes reviewing the medications, EKG, labs, face-to-face discussion/counseling of her medical condition, evaluation, management, ordering medications, test and documenting the findings in the note.      Disposition:  Follow up 1 year  Signed Taila Basinski Verne Spurr, MD, 05/10/2023 9:00 AM    [facility] [address] [place], [place], [place] [address]

## 2023-05-20 ENCOUNTER — Ambulatory Visit (HOSPITAL_COMMUNITY): Admission: RE | Admit: 2023-05-20 | Payer: BC Managed Care – PPO | Source: Ambulatory Visit

## 2023-05-20 ENCOUNTER — Ambulatory Visit (HOSPITAL_COMMUNITY)
Admission: RE | Admit: 2023-05-20 | Discharge: 2023-05-20 | Disposition: A | Discharge status: Home / Self Care | Payer: BC Managed Care – PPO | Source: Ambulatory Visit | Attending: Internal Medicine | Admitting: Internal Medicine

## 2023-05-20 DIAGNOSIS — I10 Essential (primary) hypertension: Secondary | ICD-10-CM | POA: Insufficient documentation

## 2023-06-14 ENCOUNTER — Ambulatory Visit: Payer: BC Managed Care – PPO | Admitting: Family Medicine

## 2023-07-05 ENCOUNTER — Ambulatory Visit: Payer: BC Managed Care – PPO | Admitting: Family Medicine

## 2023-08-06 ENCOUNTER — Ambulatory Visit: Payer: BC Managed Care – PPO | Admitting: Family Medicine

## 2023-08-07 ENCOUNTER — Other Ambulatory Visit: Payer: Self-pay | Admitting: Internal Medicine

## 2023-09-12 ENCOUNTER — Encounter: Payer: Self-pay | Admitting: Advanced Practice Midwife

## 2023-09-12 ENCOUNTER — Ambulatory Visit (INDEPENDENT_AMBULATORY_CARE_PROVIDER_SITE_OTHER): Payer: Self-pay | Admitting: Advanced Practice Midwife

## 2023-09-12 ENCOUNTER — Other Ambulatory Visit (HOSPITAL_COMMUNITY)
Admission: RE | Admit: 2023-09-12 | Discharge: 2023-09-12 | Disposition: A | Discharge status: Home / Self Care | Source: Ambulatory Visit | Attending: Advanced Practice Midwife | Admitting: Advanced Practice Midwife

## 2023-09-12 VITALS — BP 119/78 | HR 62 | Ht 62.0 in | Wt 239.0 lb

## 2023-09-12 DIAGNOSIS — Z30017 Encounter for initial prescription of implantable subdermal contraceptive: Secondary | ICD-10-CM

## 2023-09-12 DIAGNOSIS — Z3046 Encounter for surveillance of implantable subdermal contraceptive: Secondary | ICD-10-CM

## 2023-09-12 DIAGNOSIS — N898 Other specified noninflammatory disorders of vagina: Secondary | ICD-10-CM

## 2023-09-12 NOTE — Progress Notes (Signed)
 Family [facility] Clinic Visit  Patient name: Denise Khan MRN [id number]  Date of birth: May 12, 1986  CC & HPI:  Denise Khan is a 38 y.o.  female presenting today for nexplanon removal and reinsertion and also "doesn't feel right" in vagina.  Not sure if it's BV...  Pertinent History Reviewed:  Medical & Surgical Hx:   Past Medical History:  Diagnosis Date   Anxiety    Blood transfusion without reported diagnosis    Breech presentation 06/02/2020   Cancer [facility])    Cesarean delivery delivered 06/02/2020   Clotting disorder (HCC)    Hypertension    PE (pulmonary embolism)    Past Surgical History:  Procedure Laterality Date   CESAREAN SECTION  06/02/2020   Procedure: CESAREAN SECTION;  Surgeon: Reva Bores, MD;  Location: [facility] LD ORS;  Service: Obstetrics;;   KNEE SURGERY     PORT-A-CATH REMOVAL     Family History  Problem Relation Age of Onset   CAD Father    Hypertension Father    Heart disease Father    Hypertension Mother    Cancer Maternal Grandmother        lung   Stroke Maternal Grandfather    Hypertension Maternal Grandfather    Kidney disease Maternal Grandfather    Hernia Son     Current Outpatient Medications:    chlorthalidone (HYGROTON) 25 MG tablet, TAKE 1 TABLET(25 MG) BY MOUTH DAILY, Disp: 90 tablet, Rfl: 3   etonogestrel (NEXPLANON) 68 MG IMPL implant, 1 each by Subdermal route once., Disp: , Rfl:    rivaroxaban (XARELTO) 20 MG TABS tablet, Take 20 mg by mouth daily with supper., Disp: , Rfl:    telmisartan (MICARDIS) 40 MG tablet, Take 1 tablet (40 mg total) by mouth daily., Disp: 90 tablet, Rfl: 3   Vitamin D, Ergocalciferol, (DRISDOL) 1.25 MG (50000 UNIT) CAPS capsule, Take 1 capsule (50,000 Units total) by mouth every 7 (seven) days., Disp: 20 capsule, Rfl: 1 Social History: Reviewed -  reports that she has never smoked. She has never used smokeless tobacco.  Review of Systems:   Constitutional: Negative for fever and chills Eyes: Negative  for visual disturbances Respiratory: Negative for shortness of breath, dyspnea Cardiovascular: Negative for chest pain or palpitations  Gastrointestinal: Negative for vomiting, diarrhea and constipation; no abdominal pain Genitourinary: Negative for dysuria and urgency, vaginal irritation or itching Musculoskeletal: Negative for back pain, joint pain, myalgias  Neurological: Negative for dizziness and headaches    Objective Findings:    Physical Examination: Vitals:   09/12/23 1137  BP: 119/78  Pulse: 62   General appearance - well appearing, and in no distress Mental status - alert, oriented to person, place, and time Chest:  Normal respiratory effort Heart - normal rate and regular rhythm Abdomen:  Soft, nontender Pelvic: SSE:  moderate white dc w/o odor. Wet prep few clue only, equivocal results Musculoskeletal:  Normal range of motion without pain Extremities:  No edema      She was given informed consent for removal of her Nexplanon and reinsertion of a new one.A signed copy is in the chart.  Appropriate time out taken. Nexlanon site (left arm) identified and thea area was prepped in usual sterile fashon. 2 cc of 1% lidocaine was used to anesthetize the area starting with the distal end of the implant. A small stab incision was made right beside the implant on the distal portion.  The Nexplanon rod was grasped using hemostats and removed  without difficulty.  There was less than 3 cc blood loss. There were no complications. Next, the area was cleansed again and the new Nexplanon was inserted without difficulty.  Steri strips and a pressure bandage were applied.  Pt was instructed to remove pressure bandage in a few hours, and keep insertion site covered with a bandaid for 3 days.  Follow-up scheduled PRN problems  CRESENZO-DISHMAN,Draylon Mercadel 09/12/2023 1:39 PM   No results found for this or any previous visit (from the past 24 hours).    Assessment & Plan:  A:   Nexplanon  removal/reinsertion  Vaginal DC of unknown significanc3 P:  Swab sent Can try vaginal probiotics   No follow-ups on file.  Jacklyn Shell CNM 09/12/2023 1:39 PM

## 2023-09-12 NOTE — Patient Instructions (Signed)
Probiotic for vaginal health.

## 2023-09-12 NOTE — Progress Notes (Signed)
 Irritation in vaginal area.

## 2023-09-13 LAB — CERVICOVAGINAL ANCILLARY ONLY
Bacterial Vaginitis (gardnerella): POSITIVE — AB
Candida Glabrata: NEGATIVE
Candida Vaginitis: NEGATIVE
Comment: NEGATIVE
Comment: NEGATIVE
Comment: NEGATIVE

## 2023-09-16 ENCOUNTER — Other Ambulatory Visit: Payer: Self-pay | Admitting: Advanced Practice Midwife

## 2023-09-16 ENCOUNTER — Encounter: Payer: Self-pay | Admitting: Advanced Practice Midwife

## 2023-09-16 MED ORDER — METRONIDAZOLE 500 MG PO TABS: 500.0000 mg | ORAL_TABLET | Freq: Two times a day (BID) | ORAL | 0 refills | Status: DC

## 2023-12-09 ENCOUNTER — Ambulatory Visit: Payer: Self-pay

## 2023-12-09 ENCOUNTER — Ambulatory Visit (HOSPITAL_COMMUNITY)
Admission: RE | Admit: 2023-12-09 | Discharge: 2023-12-09 | Disposition: A | Discharge status: Home / Self Care | Source: Ambulatory Visit

## 2023-12-09 VITALS — BP 128/88 | HR 85 | Ht 70.0 in | Wt 206.0 lb

## 2023-12-09 DIAGNOSIS — M79671 Pain in right foot: Secondary | ICD-10-CM

## 2023-12-09 NOTE — Progress Notes (Signed)
   Acute Office Visit  Subjective:     Patient ID: Denise Khan, female    DOB: 09-09-1985, 38 y.o.   MRN: [id number]  Chief Complaint  Patient presents with   Medical Management of Chronic Issues    Pt here states "Right foot pain used to only be in pain when standing now its when she's resting as well, been going on for a month or more"    HPI Patient is in today for right foot pain   ROS      Objective:    BP 128/88   Pulse 85   Ht 5\' 10"  (1.778 m)   Wt 206 lb 0.6 oz (93.5 kg)   SpO2 98%   BMI 29.56 kg/m    Physical Exam Vitals and nursing note reviewed.  Constitutional:      Appearance: Normal appearance.  Eyes:     Extraocular Movements: Extraocular movements intact.     Pupils: Pupils are equal, round, and reactive to light.  Musculoskeletal:     Right foot: Tenderness (lateral aspect) present.     Left foot: Normal.  Neurological:     Mental Status: She is alert and oriented to person, place, and time.  Psychiatric:        Mood and Affect: Mood normal.        Thought Content: Thought content normal.     No results found for any visits on 12/09/23.      Assessment & Plan:   Problem List Items Addressed This Visit   None Visit Diagnoses       Right foot pain    -  Primary   obtain x-ray for further evaluation.  recommend topical Voltaren  gel, ice and elevation as needed for pain relief.   Relevant Orders   DG Foot Complete Right (Completed)       No orders of the defined types were placed in this encounter.   No follow-ups on file.  Alison Irvine, FNP

## 2023-12-19 ENCOUNTER — Ambulatory Visit: Payer: Self-pay

## 2023-12-30 ENCOUNTER — Encounter: Payer: Self-pay | Admitting: Advanced Practice Midwife

## 2024-06-03 ENCOUNTER — Other Ambulatory Visit: Payer: Self-pay | Admitting: Internal Medicine

## 2024-06-28 NOTE — Progress Notes (Unsigned)
 " Cardiology Office Note:  .   Date:  06/29/2024  ID:  Denise Khan, DOB 07-Oct-1985, MRN [id number] PCP: Denise Meade PEDLAR, FNP  [facility] Providers Cardiologist:  Denise SHAUNNA Maywood, MD {    History of Present Illness: .   Denise Khan is a 38 y.o. female  with PMHx of PE on AC, HTN, AML (in remission, follows with oncology) who reports to [facility] office for 1 year follow up.   Pertinent cardiac medical history:  HTN diagnosed in 2023 Initially started on telmisartan  40 mg daily by PCP, which resulted in ER visit with SBP > 200.  Discharged on amlodipine  5 mg daily.  She followed up with PCP and was started on telmisartan -amlodipine  40-10 mg which decreased BP adequately. ECHO 12/2022: EF 55 to 60%, trivial MV regurgitation Renal artery US  05/2023: Bilateral 1 to 59% stenosis of renal arteries with normal size kidneys.   Last seen in heartcare 05/10/2023 by Dr. Mallipeddi for follow-up.  Reported exercising with 13 pound weight loss and home SBP around 90s in the last 1 month.  Also noted some fatigue.  Otherwise doing well from cardiology standpoint.  Discontinued telmisartan -amlodipine  and started telmisartan  40 mg daily.  Continued on chlorthalidone  25 mg daily. Ordered follow-up renal artery US  05/2023 as above.  No further recommendations.  Today, reports overall doing well with home controlled BPs 110-120/60-80. Denies chest pain, shortness of breath, palpitations, syncope, presyncope, dizziness, orthopnea, PND, swelling or significant weight changes, acute bleeding, or claudication.  Reports compliance with medications except when she was out for 10 days while awaiting refilling. She mainly eat home cooked meals and endorse low sodium diet. She still exercises but not as often, however plans to exercise more frequently with Son. She is a copy. Denies tobacco use/alcohol/drug use.  Denies any recent hospitalizations or visits to the emergency  department. She has not followed up with PCP and will need annual labs.  ROS: 10 point review of system has been reviewed and considered negative except ones been listed in the HPI.   Studies Reviewed: SABRA   EKG Interpretation Date/Time:  Monday June 29 2024 14:40:21 EST Ventricular Rate:  82 PR Interval:  148 QRS Duration:  80 QT Interval:  404 QTC Calculation: 472 R Axis:   77  Text Interpretation: Normal sinus rhythm with sinus arrhythmia Nonspecific T wave abnormality Prolonged QT When compared with ECG of 10-May-2023 08:48, No significant change was found Confirmed by Sheron Hallmark ([id number]) on 06/29/2024 2:43:54 PM   CV Studies: Cardiac studies reviewed are outlined and summarized above. Otherwise please see EMR for full report.   Physical Exam:   VS:  BP 122/68 (BP Location: Right Arm, Cuff Size: Large)   Pulse 82   Ht 5' 10 (1.778 m)   Wt 238 lb (108 kg)   LMP 06/01/2024   SpO2 97%   BMI 34.15 kg/m    Wt Readings from Last 3 Encounters:  06/29/24 238 lb (108 kg)  12/09/23 206 lb 0.6 oz (93.5 kg)  09/12/23 239 lb (108.4 kg)    GEN: Well nourished, well developed in no acute distress while sitting in chair.  NECK: No JVD; No carotid bruits CARDIAC: RRR, no murmurs, rubs, gallops RESPIRATORY:  Clear to auscultation without rales, wheezing or rhonchi  ABDOMEN: Soft, non-tender, non-distended EXTREMITIES:  No edema; No deformity   ASSESSMENT AND PLAN: .   Primary hypertension Reports well controlled Home BP: 110-120/60-80's  BP this OV  well controlled today: 122/68 Continue on telmisartan  40 mg and chlorthalidone  25 mg daily.  Order CMP in addition to annual labs including FLP, Thyroid panel, A1C.  Encourage physical activity for 150 minutes per week and heart healthy low sodium diet. Discussed limiting sodium intake to < 2 grams daily.     History of pulmonary embolus (PE) Continue on Xarelto  20 mg nightly. Order CBC in addition to labs above.     Dispo:  Return to complete annual labs when fasting. Follow up in 1 years with [facility] or [facility].   Signed, Lorette CINDERELLA Kapur, PA-C  "

## 2024-06-29 ENCOUNTER — Ambulatory Visit: Attending: Physician Assistant | Admitting: Physician Assistant

## 2024-06-29 ENCOUNTER — Encounter: Payer: Self-pay | Admitting: Physician Assistant

## 2024-06-29 VITALS — BP 122/68 | HR 82 | Ht 70.0 in | Wt 238.0 lb

## 2024-06-29 DIAGNOSIS — I10 Essential (primary) hypertension: Secondary | ICD-10-CM

## 2024-06-29 DIAGNOSIS — Z86711 Personal history of pulmonary embolism: Secondary | ICD-10-CM

## 2024-06-29 NOTE — Patient Instructions (Signed)
 Medication Instructions:  Your physician recommends that you continue on your current medications as directed. Please refer to the Current Medication list given to you today.   Labwork: Fasting Lipid, CMP,CBC,Thyroid panel,A1c  Testing/Procedures: None today  Follow-Up: 1 year  Any Other Special Instructions Will Be Listed Below (If Applicable).  If you need a refill on your cardiac medications before your next appointment, please call your pharmacy.

## 2024-08-25 ENCOUNTER — Encounter: Admitting: Family Medicine
